# Patient Record
Sex: Female | Born: 1935 | Race: White | Hispanic: No | Smoking: Never smoker
Health system: Southern US, Community
[De-identification: ages and names within clinical notes are randomized; demographics above are authoritative.]

## PROBLEM LIST (undated history)

## (undated) DIAGNOSIS — K219 Gastro-esophageal reflux disease without esophagitis: Secondary | ICD-10-CM

## (undated) DIAGNOSIS — E785 Hyperlipidemia, unspecified: Secondary | ICD-10-CM

## (undated) DIAGNOSIS — I10 Essential (primary) hypertension: Secondary | ICD-10-CM

## (undated) DIAGNOSIS — Z853 Personal history of malignant neoplasm of breast: Secondary | ICD-10-CM

## (undated) DIAGNOSIS — E079 Disorder of thyroid, unspecified: Secondary | ICD-10-CM

## (undated) DIAGNOSIS — I639 Cerebral infarction, unspecified: Secondary | ICD-10-CM

## (undated) DIAGNOSIS — K469 Unspecified abdominal hernia without obstruction or gangrene: Secondary | ICD-10-CM

## (undated) DIAGNOSIS — C50919 Malignant neoplasm of unspecified site of unspecified female breast: Secondary | ICD-10-CM

## (undated) HISTORY — DX: Unspecified abdominal hernia without obstruction or gangrene: K46.9

## (undated) HISTORY — DX: Personal history of malignant neoplasm of breast: Z85.3

## (undated) HISTORY — PX: BREAST BIOPSY: SHX20

## (undated) HISTORY — DX: Cerebral infarction, unspecified: I63.9

## (undated) HISTORY — DX: Disorder of thyroid, unspecified: E07.9

## (undated) HISTORY — DX: Gastro-esophageal reflux disease without esophagitis: K21.9

## (undated) HISTORY — DX: Hyperlipidemia, unspecified: E78.5

## (undated) HISTORY — DX: Essential (primary) hypertension: I10

---

## 1998-10-28 ENCOUNTER — Other Ambulatory Visit: Admission: RE | Admit: 1998-10-28 | Discharge: 1998-10-28 | Payer: Self-pay | Admitting: *Deleted

## 1999-11-08 ENCOUNTER — Encounter: Admission: RE | Admit: 1999-11-08 | Discharge: 1999-11-08 | Payer: Self-pay | Admitting: Internal Medicine

## 1999-11-08 ENCOUNTER — Encounter: Payer: Self-pay | Admitting: Internal Medicine

## 2000-05-09 ENCOUNTER — Encounter: Admission: RE | Admit: 2000-05-09 | Discharge: 2000-05-09 | Payer: Self-pay | Admitting: Internal Medicine

## 2000-05-09 ENCOUNTER — Encounter: Payer: Self-pay | Admitting: Internal Medicine

## 2000-05-16 ENCOUNTER — Encounter: Payer: Self-pay | Admitting: Internal Medicine

## 2000-05-16 ENCOUNTER — Encounter: Admission: RE | Admit: 2000-05-16 | Discharge: 2000-05-16 | Payer: Self-pay | Admitting: Internal Medicine

## 2000-12-20 ENCOUNTER — Encounter: Payer: Self-pay | Admitting: Internal Medicine

## 2000-12-20 ENCOUNTER — Encounter: Admission: RE | Admit: 2000-12-20 | Discharge: 2000-12-20 | Payer: Self-pay | Admitting: Internal Medicine

## 2001-01-02 ENCOUNTER — Encounter: Admission: RE | Admit: 2001-01-02 | Discharge: 2001-01-02 | Payer: Self-pay | Admitting: Internal Medicine

## 2001-01-02 ENCOUNTER — Encounter: Payer: Self-pay | Admitting: Internal Medicine

## 2001-04-09 ENCOUNTER — Other Ambulatory Visit: Admission: RE | Admit: 2001-04-09 | Discharge: 2001-04-09 | Payer: Self-pay | Admitting: Obstetrics and Gynecology

## 2001-12-16 ENCOUNTER — Ambulatory Visit (HOSPITAL_COMMUNITY): Admission: RE | Admit: 2001-12-16 | Discharge: 2001-12-16 | Payer: Self-pay | Admitting: *Deleted

## 2002-01-30 ENCOUNTER — Encounter: Admission: RE | Admit: 2002-01-30 | Discharge: 2002-01-30 | Payer: Self-pay | Admitting: Obstetrics and Gynecology

## 2002-01-30 ENCOUNTER — Encounter: Payer: Self-pay | Admitting: Obstetrics and Gynecology

## 2003-03-25 ENCOUNTER — Encounter: Admission: RE | Admit: 2003-03-25 | Discharge: 2003-03-25 | Payer: Self-pay | Admitting: Internal Medicine

## 2003-04-28 ENCOUNTER — Other Ambulatory Visit: Admission: RE | Admit: 2003-04-28 | Discharge: 2003-04-28 | Payer: Self-pay | Admitting: Obstetrics and Gynecology

## 2004-05-10 ENCOUNTER — Encounter: Admission: RE | Admit: 2004-05-10 | Discharge: 2004-05-10 | Payer: Self-pay | Admitting: Internal Medicine

## 2004-05-26 ENCOUNTER — Other Ambulatory Visit: Admission: RE | Admit: 2004-05-26 | Discharge: 2004-05-26 | Payer: Self-pay | Admitting: Obstetrics and Gynecology

## 2005-06-19 ENCOUNTER — Encounter: Admission: RE | Admit: 2005-06-19 | Discharge: 2005-06-19 | Payer: Self-pay | Admitting: Internal Medicine

## 2005-07-04 ENCOUNTER — Other Ambulatory Visit: Admission: RE | Admit: 2005-07-04 | Discharge: 2005-07-04 | Payer: Self-pay | Admitting: Obstetrics and Gynecology

## 2006-06-28 ENCOUNTER — Encounter: Admission: RE | Admit: 2006-06-28 | Discharge: 2006-06-28 | Payer: Self-pay | Admitting: Internal Medicine

## 2007-07-01 ENCOUNTER — Encounter: Admission: RE | Admit: 2007-07-01 | Discharge: 2007-07-01 | Payer: Self-pay | Admitting: Internal Medicine

## 2007-07-10 ENCOUNTER — Encounter: Admission: RE | Admit: 2007-07-10 | Discharge: 2007-07-10 | Payer: Self-pay | Admitting: Internal Medicine

## 2007-07-14 ENCOUNTER — Encounter: Admission: RE | Admit: 2007-07-14 | Discharge: 2007-07-14 | Payer: Self-pay | Admitting: Internal Medicine

## 2008-12-22 ENCOUNTER — Encounter: Admission: RE | Admit: 2008-12-22 | Discharge: 2008-12-22 | Payer: Self-pay | Admitting: Internal Medicine

## 2010-03-23 ENCOUNTER — Encounter: Admission: RE | Admit: 2010-03-23 | Discharge: 2010-03-23 | Payer: Self-pay | Admitting: Internal Medicine

## 2010-04-04 ENCOUNTER — Encounter
Admission: RE | Admit: 2010-04-04 | Discharge: 2010-04-04 | Payer: Self-pay | Source: Home / Self Care | Attending: Internal Medicine | Admitting: Internal Medicine

## 2010-04-19 ENCOUNTER — Encounter
Admission: RE | Admit: 2010-04-19 | Discharge: 2010-04-19 | Payer: Self-pay | Source: Home / Self Care | Attending: Internal Medicine | Admitting: Internal Medicine

## 2010-04-23 HISTORY — PX: BREAST LUMPECTOMY: SHX2

## 2010-05-09 ENCOUNTER — Encounter
Admission: RE | Admit: 2010-05-09 | Discharge: 2010-05-09 | Payer: Self-pay | Source: Home / Self Care | Attending: Surgery | Admitting: Surgery

## 2010-05-10 LAB — COMPREHENSIVE METABOLIC PANEL
ALT: 31 U/L (ref 0–35)
AST: 39 U/L — ABNORMAL HIGH (ref 0–37)
Albumin: 3.5 g/dL (ref 3.5–5.2)
Alkaline Phosphatase: 48 U/L (ref 39–117)
BUN: 14 mg/dL (ref 6–23)
CO2: 28 mEq/L (ref 19–32)
Calcium: 9.2 mg/dL (ref 8.4–10.5)
Chloride: 106 mEq/L (ref 96–112)
Creatinine, Ser: 0.78 mg/dL (ref 0.4–1.2)
GFR calc Af Amer: >60 mL/min (ref >60)
GFR calc non Af Amer: >60 mL/min (ref >60)
Glucose, Bld: 90 mg/dL (ref 70–99)
Potassium: 4.4 mEq/L (ref 3.5–5.1)
Sodium: 140 mEq/L (ref 135–145)
Total Bilirubin: 0.5 mg/dL (ref 0.3–1.2)
Total Protein: 6.6 g/dL (ref 6.0–8.3)

## 2010-05-10 LAB — CBC
HCT: 32.6 % — ABNORMAL LOW (ref 36.0–46.0)
Hemoglobin: 11.1 g/dL — ABNORMAL LOW (ref 12.0–15.0)
MCH: 33.1 pg (ref 26.0–34.0)
MCHC: 34 g/dL (ref 30.0–36.0)
MCV: 97.3 fL (ref 78.0–100.0)
Platelets: 294 10*3/uL (ref 150–400)
RBC: 3.35 MIL/uL — ABNORMAL LOW (ref 3.87–5.11)
RDW: 14.7 % (ref 11.5–15.5)
WBC: 6.4 10*3/uL (ref 4.0–10.5)

## 2010-05-10 LAB — URINALYSIS, ROUTINE W REFLEX MICROSCOPIC
Bilirubin Urine: NEGATIVE
Hgb urine dipstick: NEGATIVE
Ketones, ur: NEGATIVE mg/dL
Nitrite: NEGATIVE
Protein, ur: NEGATIVE mg/dL
Specific Gravity, Urine: 1.007 (ref 1.005–1.030)
Urine Glucose, Fasting: NEGATIVE mg/dL
Urobilinogen, UA: 0.2 mg/dL (ref 0.0–1.0)
pH: 7.5 (ref 5.0–8.0)

## 2010-05-10 LAB — DIFFERENTIAL
Basophils Absolute: 0.1 10*3/uL (ref 0.0–0.1)
Basophils Relative: 1 % (ref 0–1)
Eosinophils Absolute: 0.3 10*3/uL (ref 0.0–0.7)
Eosinophils Relative: 5 % (ref 0–5)
Lymphocytes Relative: 34 % (ref 12–46)
Lymphs Abs: 2.2 10*3/uL (ref 0.7–4.0)
Monocytes Absolute: 0.9 10*3/uL (ref 0.1–1.0)
Monocytes Relative: 15 % — ABNORMAL HIGH (ref 3–12)
Neutro Abs: 2.9 10*3/uL (ref 1.7–7.7)
Neutrophils Relative %: 46 % (ref 43–77)

## 2010-05-11 ENCOUNTER — Ambulatory Visit
Admission: RE | Admit: 2010-05-11 | Discharge: 2010-05-11 | Payer: Self-pay | Source: Home / Self Care | Attending: Surgery | Admitting: Surgery

## 2010-05-11 ENCOUNTER — Encounter
Admission: RE | Admit: 2010-05-11 | Discharge: 2010-05-11 | Payer: Self-pay | Source: Home / Self Care | Attending: Surgery | Admitting: Surgery

## 2010-05-12 ENCOUNTER — Ambulatory Visit (HOSPITAL_BASED_OUTPATIENT_CLINIC_OR_DEPARTMENT_OTHER): Payer: Medicare Other | Admitting: Oncology

## 2010-05-14 ENCOUNTER — Encounter: Payer: Self-pay | Admitting: Internal Medicine

## 2010-05-14 NOTE — Op Note (Signed)
  [name], [name] NO.:  [id number]  MEDICAL RECORD NO.:  [id number]          PATIENT TYPE:  AMB  LOCATION:  DSC                          FACILITY:  [facility]  PHYSICIAN:  Currie Paris, M.D.DATE OF BIRTH:  March 19, 1920  DATE OF PROCEDURE:  05/11/2010 DATE OF DISCHARGE:                              OPERATIVE REPORT   PREOPERATIVE DIAGNOSIS:  Carcinoma, left breast upper outer quadrant, clinical stage I.  POSTOPERATIVE DIAGNOSIS:  Carcinoma, left breast upper outer quadrant, clinical stage I.  PROCEDURE:  Needle-guided left lumpectomy.  SURGEON:  Currie Paris, M.D.  ANESTHESIA:  General.  CLINICAL HISTORY:  This is an 89-year lady who has had a mammogram showing lesion in the upper outer quadrant of the left breast; it was a receptor positive, HER-2 negative, low S-phase invasive carcinoma with some ductal features.  After presentation at the cancer conference and discussion with the patient, we elected to proceed to a needle-guided excision.  A preoperative chest x-ray showed a questionable  lesion in the apex of the lung and plans were made to evaluate that following surgery.  DESCRIPTION OF PROCEDURE:  I saw the patient in the holding area, she had no further questions.  We confirmed the surgery as noted above and I marked the left breast.  I reviewed the guidewire films and it appeared that the guidewire had backed out somewhat, just barely being in the actual lesion.  The patient was taken to the operating room after satisfactory general anesthesia had been obtained.  The breast was prepped and draped and the time-out done.  The guidewire entered medially and tracked laterally, almost directly horizontally, so I made an incision starting at the guidewire and going directly over its tract.  The breast tissue was divided to the chest wall medial to the guidewire since the tumor was lateral to that point, and then, raised superior and  inferior flaps and then divided the breast tissue down to the chest wall and took the fascia.  The tissue here was only about 1.5 cm thick, perhaps 2 cm, and I took full thickness.  The skin flaps were extremely thin.  I got beyond the tip of the guidewire which had already backed itself somewhat out, so I went fairly far lateral to make sure I had a good lateral margin and completed the excision.  There was a firm area that appeared to be pretty much in the middle of the specimen, and I went ahead and inked it for margin assessment.  I put some 0.25% plain Marcaine, needed to help with postop analgesia, and clips to mark the margins and then closed with 3-0 Vicryl and 4-0 Monocryl subcuticular and Dermabond.  The patient tolerated the procedure well and there were no complications.  All counts were correct.     Currie Paris, M.D.     CJS/MEDQ  D:  05/11/2010  T:  05/12/2010  Job:  [id number]  cc:   Dr. Legrand Rams  Electronically Signed by Cyndia Bent M.D. on 05/14/2010 07:38:55 AM

## 2010-05-24 ENCOUNTER — Other Ambulatory Visit: Payer: Self-pay | Admitting: Oncology

## 2010-05-24 ENCOUNTER — Ambulatory Visit (HOSPITAL_BASED_OUTPATIENT_CLINIC_OR_DEPARTMENT_OTHER): Payer: Medicare Other | Admitting: Oncology

## 2010-05-24 DIAGNOSIS — C50919 Malignant neoplasm of unspecified site of unspecified female breast: Secondary | ICD-10-CM

## 2010-05-24 LAB — CANCER ANTIGEN 27.29: CA 27.29: 21 U/mL (ref 0–39)

## 2010-05-26 ENCOUNTER — Ambulatory Visit: Payer: Medicare Other | Attending: Radiation Oncology | Admitting: Radiation Oncology

## 2010-05-26 DIAGNOSIS — I10 Essential (primary) hypertension: Secondary | ICD-10-CM | POA: Insufficient documentation

## 2010-05-26 DIAGNOSIS — M899 Disorder of bone, unspecified: Secondary | ICD-10-CM | POA: Insufficient documentation

## 2010-05-26 DIAGNOSIS — K219 Gastro-esophageal reflux disease without esophagitis: Secondary | ICD-10-CM | POA: Insufficient documentation

## 2010-05-26 DIAGNOSIS — C50419 Malignant neoplasm of upper-outer quadrant of unspecified female breast: Secondary | ICD-10-CM | POA: Insufficient documentation

## 2010-05-26 DIAGNOSIS — M949 Disorder of cartilage, unspecified: Secondary | ICD-10-CM | POA: Insufficient documentation

## 2010-05-26 DIAGNOSIS — Z79899 Other long term (current) drug therapy: Secondary | ICD-10-CM | POA: Insufficient documentation

## 2010-05-26 DIAGNOSIS — E039 Hypothyroidism, unspecified: Secondary | ICD-10-CM | POA: Insufficient documentation

## 2010-05-26 DIAGNOSIS — E7800 Pure hypercholesterolemia, unspecified: Secondary | ICD-10-CM | POA: Insufficient documentation

## 2010-05-26 DIAGNOSIS — Z170 Estrogen receptor positive status [ER+]: Secondary | ICD-10-CM | POA: Insufficient documentation

## 2010-05-29 ENCOUNTER — Other Ambulatory Visit: Payer: Self-pay | Admitting: Dermatology

## 2010-05-30 ENCOUNTER — Ambulatory Visit (HOSPITAL_BASED_OUTPATIENT_CLINIC_OR_DEPARTMENT_OTHER)
Admission: RE | Admit: 2010-05-30 | Discharge: 2010-05-31 | Disposition: A | Discharge status: Home / Self Care | Payer: Medicare Other | Source: Ambulatory Visit | Attending: Surgery | Admitting: Surgery

## 2010-05-30 ENCOUNTER — Other Ambulatory Visit: Payer: Self-pay | Admitting: Surgery

## 2010-05-30 DIAGNOSIS — Z01812 Encounter for preprocedural laboratory examination: Secondary | ICD-10-CM | POA: Insufficient documentation

## 2010-05-30 DIAGNOSIS — C50419 Malignant neoplasm of upper-outer quadrant of unspecified female breast: Secondary | ICD-10-CM | POA: Insufficient documentation

## 2010-05-30 LAB — POCT I-STAT, CHEM 8
BUN: 18 mg/dL (ref 6–23)
Calcium, Ion: 1.21 mmol/L (ref 1.12–1.32)
HCT: 38 % (ref 36.0–46.0)
Sodium: 144 mEq/L (ref 135–145)
TCO2: 25 mmol/L (ref 0–100)

## 2010-06-04 NOTE — Op Note (Signed)
[name], Tabitha               ACCOUNT NO.:  [id number]  MEDICAL RECORD NO.:  [id number]           PATIENT TYPE:  LOCATION:                                 FACILITY:  PHYSICIAN:  Currie Paris, M.D.DATE OF BIRTH:  1919/11/03  DATE OF PROCEDURE:  05/30/2010 DATE OF DISCHARGE:                              OPERATIVE REPORT   PREOPERATIVE DIAGNOSIS:  Lobular carcinoma, left breast, upper outer quadrant, clinical stage I with positive margin on initial lumpectomy.  POSTOPERATIVE DIAGNOSIS:  Lobular carcinoma, left breast, upper outer quadrant, clinical stage I with positive margin on initial lumpectomy.  PROCEDURE:  Re-excision for margin control, left lumpectomy site.  SURGEON:  Currie Paris, MD  ANESTHESIA:  General.  CLINICAL HISTORY:  This is an 77 year old lady who had a small mass found on mammogram and biopsy showed an invasive lobular carcinoma. Wide local excision with the needle technique was done and grossly we appeared to be well around the tumor but she had an anterior margin and a superior margin that were close to being involved or involved, and it was decided to re-excise to see if we can get better margin control.  DESCRIPTION OF THE PROCEDURE:  I saw the patient in the holding area, she had no further questions.  Identified the left side as the operative side and both the patient and I marked that as the operative site.  The patient was taken to the operating room.  After satisfactory general (LMA) anesthesia had been obtained, the left breast was prepped and draped.  The time-out was done.  I made an elliptical incision to include about 1-cm skin superior to the scar and about 0.5 cm of skin inferior to the scar and extending the incision about 1 cm medially and laterally.  I then divided the breast tissue along the inferior part of the incision down into the lumpectomy cavity so that I would have the old superior margin as my new  margin here.  I then raised a skin flap superiorly approximately a centimeter so that I was thus 2 cm above the old scar, then divided the breast tissue down to the chest wall and then re-excised the entire superior portion of the prior lumpectomy cavity and site getting what I thought was at least a 2-cm new margin.  All of the tissue at my new margin looked to be fatty tissue, and we were fairly high almost out of the breast by this point.  I made sure everything was dry.  I irrigated.  I put clips into deep margins and the superior medial and lateral margins as well.  I did not take any new deep margin.  I then freed up the breast tissue off the fascia and tried to close the deeper layers with some 3-0 Vicryl, then more superficially with 3-0 Vicryl, 4-0 Monocryl subcuticular, and Dermabond.  The patient tolerated the procedure well.  There were no operative complications.  All counts were correct.  Specimens sent to pathology with some sutures to identify and orient the specimen for pathology.     Currie Paris, M.D.  CJS/MEDQ  D:  05/30/2010  T:  05/30/2010  Job:  [id number]  cc:   Georgianne Fick, M.D.  Electronically Signed by Cyndia Bent M.D. on 06/03/2010 02:40:49 PM

## 2010-07-04 ENCOUNTER — Ambulatory Visit (HOSPITAL_COMMUNITY)
Admission: RE | Admit: 2010-07-04 | Discharge: 2010-07-04 | Disposition: A | Discharge status: Home / Self Care | Payer: Medicare Other | Source: Ambulatory Visit | Attending: Oncology | Admitting: Oncology

## 2010-07-04 DIAGNOSIS — R918 Other nonspecific abnormal finding of lung field: Secondary | ICD-10-CM | POA: Insufficient documentation

## 2010-07-04 DIAGNOSIS — R599 Enlarged lymph nodes, unspecified: Secondary | ICD-10-CM | POA: Insufficient documentation

## 2010-07-04 DIAGNOSIS — N281 Cyst of kidney, acquired: Secondary | ICD-10-CM | POA: Insufficient documentation

## 2010-07-04 DIAGNOSIS — C50919 Malignant neoplasm of unspecified site of unspecified female breast: Secondary | ICD-10-CM | POA: Insufficient documentation

## 2010-07-04 MED ORDER — IOHEXOL 300 MG/ML  SOLN
80.0000 mL | Freq: Once | INTRAMUSCULAR | Status: AC | PRN
  Administered 2010-07-04: 80 mL via INTRAVENOUS

## 2010-08-07 ENCOUNTER — Other Ambulatory Visit: Payer: Self-pay | Admitting: Oncology

## 2010-08-07 ENCOUNTER — Encounter (HOSPITAL_BASED_OUTPATIENT_CLINIC_OR_DEPARTMENT_OTHER): Payer: Medicare Other | Admitting: Oncology

## 2010-08-07 DIAGNOSIS — C50919 Malignant neoplasm of unspecified site of unspecified female breast: Secondary | ICD-10-CM

## 2010-08-07 DIAGNOSIS — C50419 Malignant neoplasm of upper-outer quadrant of unspecified female breast: Secondary | ICD-10-CM

## 2010-08-07 LAB — HEPATIC FUNCTION PANEL
Albumin: 4 g/dL (ref 3.5–5.2)
Bilirubin, Direct: 0.2 mg/dL (ref 0.0–0.3)
Total Bilirubin: 0.6 mg/dL (ref 0.3–1.2)

## 2010-09-08 NOTE — Op Note (Signed)
   NAME:  Tabitha Harris, Tabitha Harris NO.:  [id number]   MEDICAL RECORD NO.:  [id number]                   PATIENT TYPE:  AMB   LOCATION:  ENDO                                 FACILITY:  [facility]   PHYSICIAN:  Georgiana Spinner, M.D.                 DATE OF BIRTH:  [birth year]   DATE OF PROCEDURE:  12/16/2001  DATE OF DISCHARGE:                                 OPERATIVE REPORT   PROCEDURE PERFORMED:  Colonoscopy.   INDICATIONS FOR PROCEDURE:  Colon cancer screening.   ANESTHESIA:  Demerol 10, Versed 1 mg.   DESCRIPTION OF PROCEDURE:  With the patient mildly sedated in the left  lateral decubitus position, the Olympus videoscopic colonoscope was inserted  into the rectum and passed under direct vision into the cecum, identified by  the ileocecal valve and appendiceal orifice.  Both of which were  photographed.  From this point, the colonoscope was slowly withdrawn taking  circumferential views of the entire colonic mucosa, stopping in the rectum  which appeared normal on direct and showed hemorrhoids on retroflex view.  The endoscope was straightened and withdrawn.  The patient's vital signs and  pulse oximeter remained stable.  The patient tolerated the procedure well  without apparent complications.   FINDINGS:  Unremarkable colonoscopic examination other than hemorrhoids.   PLAN:  Repeat examination in five to ten years.                                                Georgiana Spinner, M.D.    GMO/MEDQ  D:  12/16/2001  T:  12/18/2001  Job:  [id number]

## 2010-12-18 ENCOUNTER — Other Ambulatory Visit: Payer: Self-pay | Admitting: Internal Medicine

## 2010-12-18 DIAGNOSIS — I6529 Occlusion and stenosis of unspecified carotid artery: Secondary | ICD-10-CM

## 2010-12-21 ENCOUNTER — Ambulatory Visit
Admission: RE | Admit: 2010-12-21 | Discharge: 2010-12-21 | Disposition: A | Discharge status: Home / Self Care | Payer: Medicare Other | Source: Ambulatory Visit | Attending: Internal Medicine | Admitting: Internal Medicine

## 2010-12-21 DIAGNOSIS — I6529 Occlusion and stenosis of unspecified carotid artery: Secondary | ICD-10-CM

## 2010-12-29 ENCOUNTER — Other Ambulatory Visit (HOSPITAL_COMMUNITY): Payer: Self-pay | Admitting: Internal Medicine

## 2010-12-29 DIAGNOSIS — I6529 Occlusion and stenosis of unspecified carotid artery: Secondary | ICD-10-CM

## 2011-01-01 ENCOUNTER — Other Ambulatory Visit: Payer: Self-pay | Admitting: Dermatology

## 2011-01-08 ENCOUNTER — Ambulatory Visit (HOSPITAL_COMMUNITY)
Admission: RE | Admit: 2011-01-08 | Discharge: 2011-01-08 | Disposition: A | Discharge status: Home / Self Care | Payer: Medicare Other | Source: Ambulatory Visit | Attending: Internal Medicine | Admitting: Internal Medicine

## 2011-01-08 DIAGNOSIS — I6529 Occlusion and stenosis of unspecified carotid artery: Secondary | ICD-10-CM

## 2011-03-07 ENCOUNTER — Ambulatory Visit (HOSPITAL_BASED_OUTPATIENT_CLINIC_OR_DEPARTMENT_OTHER): Payer: Medicare Other | Admitting: Oncology

## 2011-03-07 ENCOUNTER — Telehealth: Payer: Self-pay | Admitting: *Deleted

## 2011-03-07 ENCOUNTER — Other Ambulatory Visit: Payer: Self-pay | Admitting: Oncology

## 2011-03-07 ENCOUNTER — Other Ambulatory Visit (HOSPITAL_BASED_OUTPATIENT_CLINIC_OR_DEPARTMENT_OTHER): Payer: Medicare Other

## 2011-03-07 VITALS — BP 152/77 | HR 67 | Temp 98.1°F | Ht 60.5 in | Wt 112.4 lb

## 2011-03-07 DIAGNOSIS — Z170 Estrogen receptor positive status [ER+]: Secondary | ICD-10-CM

## 2011-03-07 DIAGNOSIS — C50419 Malignant neoplasm of upper-outer quadrant of unspecified female breast: Secondary | ICD-10-CM

## 2011-03-07 DIAGNOSIS — C50919 Malignant neoplasm of unspecified site of unspecified female breast: Secondary | ICD-10-CM

## 2011-03-07 DIAGNOSIS — M549 Dorsalgia, unspecified: Secondary | ICD-10-CM

## 2011-03-07 LAB — CBC WITH DIFFERENTIAL/PLATELET
Basophils Absolute: 0.1 10*3/uL (ref 0.0–0.1)
EOS%: 4 % (ref 0.0–7.0)
HCT: 33.5 % — ABNORMAL LOW (ref 34.8–46.6)
HGB: 11.5 g/dL — ABNORMAL LOW (ref 11.6–15.9)
LYMPH%: 36.1 % (ref 14.0–49.7)
MCH: 34 pg (ref 25.1–34.0)
NEUT%: 45.8 % (ref 38.4–76.8)
Platelets: 263 10*3/uL (ref 145–400)
lymph#: 2.4 10*3/uL (ref 0.9–3.3)

## 2011-03-07 NOTE — Telephone Encounter (Signed)
Gave patient appointment for 02-2012 lab on one day and the md appointment the week after

## 2011-03-07 NOTE — Progress Notes (Signed)
Tabitha Harris is an 77 y.o. female.    Chief Complaint  Patient presents with  . Breast Cancer    HPI:  Tabitha Harris returns for followup of her breast cancer the interval history is generally unremarkable except for the bad news that her son in [place] has been diagnosed with pancreatic cancer metastatic to the liver. "The whole family is very upset about this". As far as cost of the drugs is concerned, she was being built $200 a month by [employer] for letrozole, and she wrote him a letter which they never answered. I have asked her to bring the those bills and we will see if we can send [employer] a letter because that was really high rubbery. She is now getting her medication from [facility] at less than the fourth of the cost  Past Medical History: Significant for osteopenia hypertension hypothyroidism hypercholesterolemia glaucoma GERD long-standing heart murmur a lateral cataract surgery and right carotid artery stenosis   Family History: The patient's father died from a subarachnoid hemorrhage. The patient's mother died at the age of 63. The cause was unclear. The patient has 3 sisters one of whom had cancer of the uterus. There is no history of breast or ovarian cancer in the family she had no brothers  Gynecologic history:  GX T4 first pregnancy to term age 83 menarche age 72 menopause early 37s she never took hormone   Social History: Her husband died from esophageal cancer approximately 17 years ago. She lives by herself. Her oldest daughter [name], 66, lives in [place] and works in a Scientist, physiological a lab. Son [name] lives in [place] and works as an Acupuncturist. He is 61. Son [name] 63 is an Pensions consultant for [employer]. The patient's fourth child a daughter died from uterine cancer at the age of 34 and 77 the patient has 7 grandchildren she is not a church a tender      Allergies: Allergies not on file  No current outpatient prescriptions on file as of 03/07/2011.   No  current facility-administered medications on file as of 03/07/2011.    ROS she plays tennis regularly. When she takes long walks or hips hurt. He is a little bit of a runny nose which is the common for her this time of year. She bruises easily has had some skin cancers removed within the last year and she thinks she needs new glasses overall however a detailed review of systems was noncontributory except for mild low back pain which is chronic, and has not increased in intensity or frequency  Physical Exam:  Blood pressure 152/77, pulse 67, temperature 98.1 F (36.7 C), height 5' 0.5" (1.537 m), weight 112 lb 6.4 oz (50.984 kg).  Sclerae unicteric Oropharynx clear No peripheral adenopathy Lungs no rales or rhonchi Heart regular rate and rhythm Abd benign MSK no focal spinal tenderness, no peripheral edema, mod scoliosis Neuro: nonfocal Breasts: Right breast no suspicious findings left breast status post lumpectomy no evidence of local recurrence  CBC Lab Results  Component Value Date   WBC 6.4 05/09/2010   HGB 11.5* 03/07/2011   HCT 33.5* 03/07/2011   MCV 99.1 03/07/2011   PLT 263 03/07/2011   CMP     Component Value Date/Time   NA 144 05/30/2010 0726   K 3.9 05/30/2010 0726   CL 109 05/30/2010 0726   CO2 28 05/09/2010 1202   GLUCOSE 88 05/30/2010 0726   BUN 18 05/30/2010 0726   CREATININE 0.9 05/30/2010 0726  CALCIUM 9.2 05/09/2010 1202   PROT 6.5 08/07/2010 1408   ALBUMIN 4.0 08/07/2010 1408   AST 30 08/07/2010 1408   ALT 23 08/07/2010 1408   ALKPHOS 51 08/07/2010 1408   BILITOT 0.6 08/07/2010 1408   GFRNONAA >60 05/09/2010 1202   GFRAA  Value: >60        The eGFR has been calculated using the MDRD equation. This calculation has not been validated in all clinical situations. eGFR's persistently <60 mL/min signify possible Chronic Kidney Disease. 05/09/2010 1202     FILMS: She is due for mammography next month and this is being set up to tells me she had bone density which showed mild  osteopenia I do not have those results.   Assessment:  77 year old [place] woman status post left lumpectomy January of 2012 for a 1.5 cm grade 1 invasive lobular breast cancer, with positive margins cleared early February 2012, the tumor being strongly estrogen and progesterone receptor positive, MIB-1 of 14% and no HER-2 amplification, on letrozole since Feb of 2012  Plan:  She was losing some hair and stop taking the letrozole for a couple of months. She has not resumed. It is not clear to me whether the hair loss was really due to this or not but if it recurs we will switch her to exemestane. She will bring [place] at the bills from the [employer] over charge and we will see if writing than the latter gets her a refund. Otherwise the plan is to continue letrozole or another antiestrogen for a total of 5 years she will continue to see [place] on a once a year basis alternating with Dr. Serita Kyle C 03/07/2011, 1:39 PM

## 2011-04-06 ENCOUNTER — Ambulatory Visit
Admission: RE | Admit: 2011-04-06 | Discharge: 2011-04-06 | Disposition: A | Discharge status: Home / Self Care | Payer: Medicare Other | Source: Ambulatory Visit | Attending: Oncology | Admitting: Oncology

## 2011-04-06 DIAGNOSIS — C50919 Malignant neoplasm of unspecified site of unspecified female breast: Secondary | ICD-10-CM

## 2011-05-25 ENCOUNTER — Encounter (INDEPENDENT_AMBULATORY_CARE_PROVIDER_SITE_OTHER): Payer: Self-pay | Admitting: General Surgery

## 2011-05-25 DIAGNOSIS — Z853 Personal history of malignant neoplasm of breast: Secondary | ICD-10-CM | POA: Insufficient documentation

## 2011-05-30 ENCOUNTER — Ambulatory Visit (INDEPENDENT_AMBULATORY_CARE_PROVIDER_SITE_OTHER): Payer: Medicare Other | Admitting: Surgery

## 2011-05-30 ENCOUNTER — Encounter (INDEPENDENT_AMBULATORY_CARE_PROVIDER_SITE_OTHER): Payer: Self-pay | Admitting: Surgery

## 2011-05-30 VITALS — BP 118/86 | HR 64 | Temp 97.6°F | Resp 18 | Ht 59.0 in | Wt 112.0 lb

## 2011-05-30 DIAGNOSIS — Z853 Personal history of malignant neoplasm of breast: Secondary | ICD-10-CM

## 2011-05-30 NOTE — Progress Notes (Signed)
NAME: Tabitha Harris       DOB: 1920/03/25           DATE: 05/30/2011       MRN: [id number]   Tabitha Harris is a 78 y.o.Marland Kitchenfemale who presents for routine followup of her Left breast cancer diagnosed in 2012 and treated with lumpectomy alone. She has no problems or concerns on either side.  PFSH: She has had no significant changes since the last visit here.  ROS: There have been no significant changes since the last visit here  EXAM: General: The patient is alert, oriented, generally healty appearing, NAD. Mood and affect are normal.  Breasts:  Symmetric, mild irregularity and nodularity bilaterally, no mass, no tenderness  Lymphatics: She has no axillary or supraclavicular adenopathy on either side.  Extremities: Full ROM of the surgical side with no lymphedema noted.  Data Reviewed: Mammogram in December negative  Impression: Doing well, with no evidence of recurrent cancer or new cancer  Plan: Will continue to follow up in six months.

## 2011-06-13 DIAGNOSIS — E785 Hyperlipidemia, unspecified: Secondary | ICD-10-CM | POA: Diagnosis not present

## 2011-06-13 DIAGNOSIS — R5381 Other malaise: Secondary | ICD-10-CM | POA: Diagnosis not present

## 2011-06-20 DIAGNOSIS — E782 Mixed hyperlipidemia: Secondary | ICD-10-CM | POA: Diagnosis not present

## 2011-06-20 DIAGNOSIS — E039 Hypothyroidism, unspecified: Secondary | ICD-10-CM | POA: Diagnosis not present

## 2011-06-20 DIAGNOSIS — I10 Essential (primary) hypertension: Secondary | ICD-10-CM | POA: Diagnosis not present

## 2011-06-20 DIAGNOSIS — I6529 Occlusion and stenosis of unspecified carotid artery: Secondary | ICD-10-CM | POA: Diagnosis not present

## 2011-07-02 ENCOUNTER — Other Ambulatory Visit: Payer: Self-pay | Admitting: Dermatology

## 2011-07-02 DIAGNOSIS — L570 Actinic keratosis: Secondary | ICD-10-CM | POA: Diagnosis not present

## 2011-07-02 DIAGNOSIS — C44519 Basal cell carcinoma of skin of other part of trunk: Secondary | ICD-10-CM | POA: Diagnosis not present

## 2011-07-02 DIAGNOSIS — L821 Other seborrheic keratosis: Secondary | ICD-10-CM | POA: Diagnosis not present

## 2011-07-02 DIAGNOSIS — Z85828 Personal history of other malignant neoplasm of skin: Secondary | ICD-10-CM | POA: Diagnosis not present

## 2011-07-26 DIAGNOSIS — H26499 Other secondary cataract, unspecified eye: Secondary | ICD-10-CM | POA: Diagnosis not present

## 2011-07-26 DIAGNOSIS — H4010X0 Unspecified open-angle glaucoma, stage unspecified: Secondary | ICD-10-CM | POA: Diagnosis not present

## 2011-07-26 DIAGNOSIS — H43399 Other vitreous opacities, unspecified eye: Secondary | ICD-10-CM | POA: Diagnosis not present

## 2011-12-25 ENCOUNTER — Other Ambulatory Visit: Payer: Self-pay | Admitting: Obstetrics and Gynecology

## 2011-12-25 DIAGNOSIS — Z124 Encounter for screening for malignant neoplasm of cervix: Secondary | ICD-10-CM | POA: Diagnosis not present

## 2011-12-26 DIAGNOSIS — E039 Hypothyroidism, unspecified: Secondary | ICD-10-CM | POA: Diagnosis not present

## 2011-12-26 DIAGNOSIS — Z79899 Other long term (current) drug therapy: Secondary | ICD-10-CM | POA: Diagnosis not present

## 2011-12-26 DIAGNOSIS — I6529 Occlusion and stenosis of unspecified carotid artery: Secondary | ICD-10-CM | POA: Diagnosis not present

## 2011-12-26 DIAGNOSIS — E782 Mixed hyperlipidemia: Secondary | ICD-10-CM | POA: Diagnosis not present

## 2011-12-26 DIAGNOSIS — I10 Essential (primary) hypertension: Secondary | ICD-10-CM | POA: Diagnosis not present

## 2012-01-02 DIAGNOSIS — E039 Hypothyroidism, unspecified: Secondary | ICD-10-CM | POA: Diagnosis not present

## 2012-01-02 DIAGNOSIS — H908 Mixed conductive and sensorineural hearing loss, unspecified: Secondary | ICD-10-CM | POA: Diagnosis not present

## 2012-01-02 DIAGNOSIS — J449 Chronic obstructive pulmonary disease, unspecified: Secondary | ICD-10-CM | POA: Diagnosis not present

## 2012-01-02 DIAGNOSIS — I10 Essential (primary) hypertension: Secondary | ICD-10-CM | POA: Diagnosis not present

## 2012-01-02 DIAGNOSIS — E782 Mixed hyperlipidemia: Secondary | ICD-10-CM | POA: Diagnosis not present

## 2012-01-07 DIAGNOSIS — L570 Actinic keratosis: Secondary | ICD-10-CM | POA: Diagnosis not present

## 2012-01-07 DIAGNOSIS — Z85828 Personal history of other malignant neoplasm of skin: Secondary | ICD-10-CM | POA: Diagnosis not present

## 2012-01-07 DIAGNOSIS — D239 Other benign neoplasm of skin, unspecified: Secondary | ICD-10-CM | POA: Diagnosis not present

## 2012-01-07 DIAGNOSIS — L821 Other seborrheic keratosis: Secondary | ICD-10-CM | POA: Diagnosis not present

## 2012-01-07 DIAGNOSIS — L608 Other nail disorders: Secondary | ICD-10-CM | POA: Diagnosis not present

## 2012-01-07 DIAGNOSIS — L820 Inflamed seborrheic keratosis: Secondary | ICD-10-CM | POA: Diagnosis not present

## 2012-01-14 ENCOUNTER — Ambulatory Visit (INDEPENDENT_AMBULATORY_CARE_PROVIDER_SITE_OTHER): Payer: Medicare Other | Admitting: Surgery

## 2012-01-14 ENCOUNTER — Encounter (INDEPENDENT_AMBULATORY_CARE_PROVIDER_SITE_OTHER): Payer: Self-pay | Admitting: Surgery

## 2012-01-14 VITALS — BP 112/68 | HR 56 | Temp 98.2°F | Resp 14 | Ht 59.5 in | Wt 110.4 lb

## 2012-01-14 DIAGNOSIS — Z853 Personal history of malignant neoplasm of breast: Secondary | ICD-10-CM | POA: Diagnosis not present

## 2012-01-14 NOTE — Progress Notes (Signed)
NAME: CHRYSTYNA PEARY Queener       DOB: 1919/11/20           DATE: 01/14/2012       MRN: [id number]   Tabitha Harris is a 78 y.o.Marland Kitchenfemale who presents for routine followup of her Left breast cancer (ILC) diagnosed in 2012 and treated with lumpectomy alone. She has no problems or concerns on either side.  PFSH: She has had no significant changes since the last visit here.  ROS: There have been no significant changes since the last visit here  EXAM: General: The patient is alert, oriented, generally healty appearing, NAD. Mood and affect are normal.  Breasts:  Symmetric, mild irregularity and nodularity bilaterally, no mass, no tenderness  Lymphatics: She has no axillary or supraclavicular adenopathy on either side.  Extremities: Full ROM of the surgical side with no lymphedema noted.  Data Reviewed: Mammogram in December negative  Impression: Doing well, with no evidence of recurrent cancer or new cancer  Plan: Will continue to follow up in six months.

## 2012-01-14 NOTE — Patient Instructions (Signed)
See me again in 6 months 

## 2012-02-01 DIAGNOSIS — Z23 Encounter for immunization: Secondary | ICD-10-CM | POA: Diagnosis not present

## 2012-02-04 DIAGNOSIS — H4010X0 Unspecified open-angle glaucoma, stage unspecified: Secondary | ICD-10-CM | POA: Diagnosis not present

## 2012-02-28 ENCOUNTER — Other Ambulatory Visit (HOSPITAL_BASED_OUTPATIENT_CLINIC_OR_DEPARTMENT_OTHER): Payer: Medicare Other | Admitting: Lab

## 2012-02-28 DIAGNOSIS — C50919 Malignant neoplasm of unspecified site of unspecified female breast: Secondary | ICD-10-CM

## 2012-02-28 LAB — CBC WITH DIFFERENTIAL/PLATELET
BASO%: 1.4 % (ref 0.0–2.0)
EOS%: 4.3 % (ref 0.0–7.0)
HCT: 32.8 % — ABNORMAL LOW (ref 34.8–46.6)
LYMPH%: 44.4 % (ref 14.0–49.7)
MCH: 36.5 pg — ABNORMAL HIGH (ref 25.1–34.0)
MCHC: 34.5 g/dL (ref 31.5–36.0)
MCV: 105.7 fL — ABNORMAL HIGH (ref 79.5–101.0)
MONO#: 0.7 10*3/uL (ref 0.1–0.9)
MONO%: 12.8 % (ref 0.0–14.0)
NEUT%: 37.1 % — ABNORMAL LOW (ref 38.4–76.8)
Platelets: 250 10*3/uL (ref 145–400)

## 2012-02-28 LAB — COMPREHENSIVE METABOLIC PANEL (CC13)
AST: 33 U/L (ref 5–34)
Alkaline Phosphatase: 58 U/L (ref 40–150)
BUN: 14 mg/dL (ref 7.0–26.0)
Calcium: 9.9 mg/dL (ref 8.4–10.4)
Creatinine: 0.8 mg/dL (ref 0.6–1.1)

## 2012-03-06 ENCOUNTER — Ambulatory Visit (HOSPITAL_BASED_OUTPATIENT_CLINIC_OR_DEPARTMENT_OTHER): Payer: Medicare Other | Admitting: Oncology

## 2012-03-06 ENCOUNTER — Telehealth: Payer: Self-pay | Admitting: Oncology

## 2012-03-06 VITALS — BP 133/78 | HR 81 | Temp 98.3°F | Resp 20 | Ht 59.5 in | Wt 110.6 lb

## 2012-03-06 DIAGNOSIS — Z853 Personal history of malignant neoplasm of breast: Secondary | ICD-10-CM

## 2012-03-06 DIAGNOSIS — C50419 Malignant neoplasm of upper-outer quadrant of unspecified female breast: Secondary | ICD-10-CM | POA: Diagnosis not present

## 2012-03-06 MED ORDER — LETROZOLE 2.5 MG PO TABS: 2.5000 mg | ORAL_TABLET | Freq: Every day | ORAL | Status: DC

## 2012-03-06 NOTE — Progress Notes (Signed)
ID: Tabitha Harris   DOB: [birth year]  MR#: [id number]  YQM#:[id number]  PCP: Georgianne Fick, MD GYN: Marcelle Overlie MD SUThersa Salt MD OTHER MD: Dorothy Puffer   HISTORY OF PRESENT ILLNESS: She had her routine screening mammography March 23, 2010 at the breast center and this showed heterogeneously dense tissue with a possible mass in the left breast.  She was recalled for additional views December 13.  This confirmed a small spiculated mass in the left upper outer quadrant posteriorly which was not palpable by exam.  Ultrasound showed it to be a small irregularly marginated mass measuring 5 mm.  The axilla was unremarkable.  This was felt to be sufficiently suspicious.  That biopsy was performed the same day and showed ([id number]) an invasive lobular carcinoma which was estrogen receptor positive at 99%, progesterone receptor positive at 55% with a low proliferation fraction at 14% and no evidence of HER2/neu amplification by CISH with a ratio of 0.94.  With this information, the patient was referred to Dr. Jamey Ripa and bilateral breast MRIs were obtained April 19, 2010.  This confirmed a solitary irregular enhancing mass in the upper outer quadrant of the left breast measuring again 5 mm.  There were no other suspicious areas and no adenopathy.  There was a trace right pleural effusion incidentally noted.  With this information after appropriate discussion, the patient proceeded to left lumpectomy May 11, 2010.  This case was discussed in the multidisciplinary breast cancer conference prior to surgery and it was felt axillary lymph node sampling would not alter treatment and therefore this was not performed.  The final pathology ([id number]) confirmed that invasive lobular carcinoma, grade 1, measuring 1.5 cm.  Margins anteriorly and superiorly were positive.   INTERVAL HISTORY: [name] returns for routine followup of her breast cancer. Interval history is unremarkable. She is doing  "fine", worked during the November 5 elections, continues to play tennis, and had family, from is far away as [place] to celebrate her 78th birthday.  REVIEW OF SYSTEMS: She is now paying $25 for 3 months of aromatase inhibitor, versus $200  Previously. Obviously she was over chart, and we worked hard to try to get her money back but that proved impossible. She is tolerating her letrozole with no side effects that she is aware of, and particularly in an of the arthralgias/myalgias that so many patients half. She complains of hearing loss, some low back pain which is not more frequent or intense than previously, and some easy bruising. Otherwise a detailed review of systems today was noncontributory   PAST MEDICAL HISTORY: Past Medical History  Diagnosis Date  . History of breast cancer     left  . Hypertension   . Hyperlipidemia   . GERD (gastroesophageal reflux disease)   . Thyroid disease     hypothyroidism  . Hernia   Past medical history is significant for osteopenia, hypertension, hypothyroidism, hypercholesterolemia, glaucoma, GERD, heart murmur ("I have had this all my life", "I have to take antibiotics before dental procedures"), status post bilateral cataract surgery.  PAST SURGICAL HISTORY: Past Surgical History  Procedure Date  . Breast lumpectomy 2012    left    FAMILY HISTORY Family History  Problem Relation Age of Onset  . Cancer Mother     breast  The patient's father died from a subarachnoid hemorrhage at the age of 17, the patient's mother died at the age of 19 from unclear causes.  The patient has three sisters, one of whom had  cancer of the uterus.  There is no history of breast or ovarian cancer in the family.  She had no brothers.  GYNECOLOGIC HISTORY: She is Gx, P4, first pregnancy to term age 57, menarche age 21, menopause in her early 28s.  She took hormones for less than a month because she "felt bad" while taking them.  SOCIAL HISTORY: Asked what she did  for living, she says "I play tennis as much as I can."  Her husband died from esophageal cancer 16 years ago and she lives by herself.  Her oldest daughter Tabitha Harris, 24 lives in [place] and works in a lab that apparently Research scientist (physical sciences) vaccines.  Son, Tabitha Harris, lives in [place], [place], he is an Acupuncturist.  He is 60.  Son, Tabitha Harris, 19, currently lives in the [place] where he is an Pensions consultant for [employer].  The patient's fourth child, a daughter, died from uterine cancer at the age of 44 in 11.  The patient has seven grandchildren.  She is not a Advice worker.   ADVANCED DIRECTIVES: in place  HEALTH MAINTENANCE: History  Substance Use Topics  . Smoking status: Former Games developer  . Smokeless tobacco: Not on file  . Alcohol Use: Yes     Colonoscopy:  PAP:  Bone density:  Lipid panel:  Allergies  Allergen Reactions  . Codeine     Current Outpatient Prescriptions  Medication Sig Dispense Refill  . amLODipine (NORVASC) 5 MG tablet Take 5 mg by mouth daily.        Marland Kitchen aspirin 81 MG tablet Take 81 mg by mouth daily.        Marland Kitchen atorvastatin (LIPITOR) 20 MG tablet Take 20 mg by mouth daily.        . bimatoprost (LUMIGAN) 0.01 % SOLN 1 drop at bedtime.      . calcium carbonate (OS-CAL) 600 MG TABS Take 600 mg by mouth 2 (two) times daily with a meal.        . dorzolamide (TRUSOPT) 2 % ophthalmic solution 1 drop 2 (two) times daily.        . dorzolamide-timolol (COSOPT) 22.3-6.8 MG/ML ophthalmic solution       . esomeprazole (NEXIUM) 40 MG capsule Take 40 mg by mouth daily before breakfast.        . fish oil-omega-3 fatty acids 1000 MG capsule Take 2 g by mouth daily.        Marland Kitchen letrozole (FEMARA) 2.5 MG tablet Take 2.5 mg by mouth daily.        Marland Kitchen levothyroxine (SYNTHROID, LEVOTHROID) 75 MCG tablet Take 75 mcg by mouth daily.        . Multiple Vitamins-Minerals (MULTIVITAMIN WITH MINERALS) tablet Take 1 tablet by mouth daily.          OBJECTIVE: Elderly white woman who  appears healthy Filed Vitals:   03/06/12 1138  BP: 133/78  Pulse: 81  Temp: 98.3 F (36.8 C)  Resp: 20     Body mass index is 21.96 kg/(m^2).    ECOG FS: 0  Sclerae unicteric Oropharynx clear No cervical or supraclavicular adenopathy Lungs no rales or rhonchi Heart regular rate and rhythm Abd benign MSK no focal spinal tenderness, no peripheral edema Neuro: nonfocal Breasts: The right breast is unremarkable. The left breast is status post lumpectomy. There is no evidence of local recurrence. The left axilla is clear.   LAB RESULTS: Lab Results  Component Value Date   WBC 5.7 02/28/2012   NEUTROABS 2.1 02/28/2012  HGB 11.3* 02/28/2012   HCT 32.8* 02/28/2012   MCV 105.7* 02/28/2012   PLT 250 02/28/2012      Chemistry      Component Value Date/Time   NA 142 02/28/2012 1203   NA 144 05/30/2010 0726   K 4.5 02/28/2012 1203   K 3.9 05/30/2010 0726   CL 109* 02/28/2012 1203   CL 109 05/30/2010 0726   CO2 29 02/28/2012 1203   CO2 28 05/09/2010 1202   BUN 14.0 02/28/2012 1203   BUN 18 05/30/2010 0726   CREATININE 0.8 02/28/2012 1203   CREATININE 0.9 05/30/2010 0726      Component Value Date/Time   CALCIUM 9.9 02/28/2012 1203   CALCIUM 9.2 05/09/2010 1202   ALKPHOS 58 02/28/2012 1203   ALKPHOS 51 08/07/2010 1408   AST 33 02/28/2012 1203   AST 30 08/07/2010 1408   ALT 25 02/28/2012 1203   ALT 23 08/07/2010 1408   BILITOT 0.96 02/28/2012 1203   BILITOT 0.6 08/07/2010 1408       Lab Results  Component Value Date   LABCA2 21 05/24/2010    No components found with this basename: [id number]    No results found for this basename: INR:1;PROTIME:1 in the last 168 hours  Urinalysis    Component Value Date/Time   COLORURINE YELLOW 05/09/2010 1205   APPEARANCEUR CLEAR 05/09/2010 1205   LABSPEC 1.007 05/09/2010 1205   PHURINE 7.5 05/09/2010 1205   HGBUR NEGATIVE 05/09/2010 1205   BILIRUBINUR NEGATIVE 05/09/2010 1205   KETONESUR NEGATIVE 05/09/2010 1205   PROTEINUR NEGATIVE 05/09/2010 1205    UROBILINOGEN 0.2 05/09/2010 1205   NITRITE NEGATIVE 05/09/2010 1205   LEUKOCYTESUR NEGATIVE MICROSCOPIC NOT DONE ON URINES WITH NEGATIVE PROTEIN, BLOOD, LEUKOCYTES, NITRITE, OR GLUCOSE <1000 mg/dL. 05/09/2010 1205    STUDIES: No results found. She is due for repeat mammography next month.  ASSESSMENT: 78 y.o. [place] woman status post left lumpectomy January of 2012 for a 1.5 cm grade 1 invasive lobular breast cancer, with positive margins cleared early February 2012, the tumor being strongly estrogen and progesterone receptor positive, MIB-1 of 14% and no HER-2 amplification, on letrozole since Feb of 2012   PLAN: Tahleah is doing terrific from a breast cancer point of view, and biologically she is at least 15 or 20 years younger than her stated age. She is also followed by Dr. Jamey Ripa and Dr. Vincente Poli as well as her primary care physician, so I am going to start seeing her on a once a year basis, but just to make sure that I see her after her mammography and not before her next appointment with me will be January of 2015. She knows to call for any problems that may develop before that visit.   Aries Townley C    03/06/2012

## 2012-03-06 NOTE — Telephone Encounter (Signed)
Pt is aware that she will be called when it is time to schedule the 2015 appts. Templates are not built for 2015 as yet. Pt understood

## 2012-03-10 ENCOUNTER — Other Ambulatory Visit: Payer: Self-pay | Admitting: Oncology

## 2012-03-10 DIAGNOSIS — Z853 Personal history of malignant neoplasm of breast: Secondary | ICD-10-CM

## 2012-04-07 ENCOUNTER — Ambulatory Visit
Admission: RE | Admit: 2012-04-07 | Discharge: 2012-04-07 | Disposition: A | Discharge status: Home / Self Care | Payer: Medicare Other | Source: Ambulatory Visit | Attending: Oncology | Admitting: Oncology

## 2012-04-07 DIAGNOSIS — Z853 Personal history of malignant neoplasm of breast: Secondary | ICD-10-CM

## 2012-07-02 DIAGNOSIS — E782 Mixed hyperlipidemia: Secondary | ICD-10-CM | POA: Diagnosis not present

## 2012-07-02 DIAGNOSIS — J449 Chronic obstructive pulmonary disease, unspecified: Secondary | ICD-10-CM | POA: Diagnosis not present

## 2012-07-14 DIAGNOSIS — D239 Other benign neoplasm of skin, unspecified: Secondary | ICD-10-CM | POA: Diagnosis not present

## 2012-07-14 DIAGNOSIS — Z85828 Personal history of other malignant neoplasm of skin: Secondary | ICD-10-CM | POA: Diagnosis not present

## 2012-07-14 DIAGNOSIS — L570 Actinic keratosis: Secondary | ICD-10-CM | POA: Diagnosis not present

## 2012-07-14 DIAGNOSIS — L819 Disorder of pigmentation, unspecified: Secondary | ICD-10-CM | POA: Diagnosis not present

## 2012-07-14 DIAGNOSIS — L821 Other seborrheic keratosis: Secondary | ICD-10-CM | POA: Diagnosis not present

## 2012-08-11 DIAGNOSIS — H26499 Other secondary cataract, unspecified eye: Secondary | ICD-10-CM | POA: Diagnosis not present

## 2012-08-11 DIAGNOSIS — H4010X0 Unspecified open-angle glaucoma, stage unspecified: Secondary | ICD-10-CM | POA: Diagnosis not present

## 2012-08-11 DIAGNOSIS — H43399 Other vitreous opacities, unspecified eye: Secondary | ICD-10-CM | POA: Diagnosis not present

## 2012-11-17 DIAGNOSIS — M899 Disorder of bone, unspecified: Secondary | ICD-10-CM | POA: Diagnosis not present

## 2013-01-12 DIAGNOSIS — L570 Actinic keratosis: Secondary | ICD-10-CM | POA: Diagnosis not present

## 2013-01-12 DIAGNOSIS — Z85828 Personal history of other malignant neoplasm of skin: Secondary | ICD-10-CM | POA: Diagnosis not present

## 2013-01-12 DIAGNOSIS — L608 Other nail disorders: Secondary | ICD-10-CM | POA: Diagnosis not present

## 2013-01-12 DIAGNOSIS — L821 Other seborrheic keratosis: Secondary | ICD-10-CM | POA: Diagnosis not present

## 2013-01-12 DIAGNOSIS — D1801 Hemangioma of skin and subcutaneous tissue: Secondary | ICD-10-CM | POA: Diagnosis not present

## 2013-01-13 DIAGNOSIS — E782 Mixed hyperlipidemia: Secondary | ICD-10-CM | POA: Diagnosis not present

## 2013-01-13 DIAGNOSIS — I779 Disorder of arteries and arterioles, unspecified: Secondary | ICD-10-CM | POA: Diagnosis not present

## 2013-01-13 DIAGNOSIS — J449 Chronic obstructive pulmonary disease, unspecified: Secondary | ICD-10-CM | POA: Diagnosis not present

## 2013-01-13 DIAGNOSIS — N390 Urinary tract infection, site not specified: Secondary | ICD-10-CM | POA: Diagnosis not present

## 2013-01-13 DIAGNOSIS — I6529 Occlusion and stenosis of unspecified carotid artery: Secondary | ICD-10-CM | POA: Diagnosis not present

## 2013-01-16 DIAGNOSIS — Z23 Encounter for immunization: Secondary | ICD-10-CM | POA: Diagnosis not present

## 2013-01-20 ENCOUNTER — Other Ambulatory Visit: Payer: Self-pay | Admitting: Internal Medicine

## 2013-01-20 DIAGNOSIS — I6529 Occlusion and stenosis of unspecified carotid artery: Secondary | ICD-10-CM | POA: Diagnosis not present

## 2013-01-20 DIAGNOSIS — E039 Hypothyroidism, unspecified: Secondary | ICD-10-CM | POA: Diagnosis not present

## 2013-01-20 DIAGNOSIS — R490 Dysphonia: Secondary | ICD-10-CM | POA: Diagnosis not present

## 2013-01-20 DIAGNOSIS — I10 Essential (primary) hypertension: Secondary | ICD-10-CM | POA: Diagnosis not present

## 2013-01-20 DIAGNOSIS — R1310 Dysphagia, unspecified: Secondary | ICD-10-CM | POA: Diagnosis not present

## 2013-01-20 DIAGNOSIS — Z23 Encounter for immunization: Secondary | ICD-10-CM | POA: Diagnosis not present

## 2013-01-20 DIAGNOSIS — I6523 Occlusion and stenosis of bilateral carotid arteries: Secondary | ICD-10-CM

## 2013-01-20 DIAGNOSIS — E782 Mixed hyperlipidemia: Secondary | ICD-10-CM | POA: Diagnosis not present

## 2013-01-21 ENCOUNTER — Ambulatory Visit
Admission: RE | Admit: 2013-01-21 | Discharge: 2013-01-21 | Disposition: A | Discharge status: Home / Self Care | Payer: Medicare Other | Source: Ambulatory Visit | Attending: Internal Medicine | Admitting: Internal Medicine

## 2013-01-21 DIAGNOSIS — I658 Occlusion and stenosis of other precerebral arteries: Secondary | ICD-10-CM | POA: Diagnosis not present

## 2013-01-21 DIAGNOSIS — I6523 Occlusion and stenosis of bilateral carotid arteries: Secondary | ICD-10-CM

## 2013-01-26 DIAGNOSIS — Z01419 Encounter for gynecological examination (general) (routine) without abnormal findings: Secondary | ICD-10-CM | POA: Diagnosis not present

## 2013-02-03 ENCOUNTER — Other Ambulatory Visit (HOSPITAL_COMMUNITY): Payer: Self-pay | Admitting: Internal Medicine

## 2013-02-03 DIAGNOSIS — I771 Stricture of artery: Secondary | ICD-10-CM

## 2013-02-05 ENCOUNTER — Telehealth (HOSPITAL_COMMUNITY): Payer: Self-pay | Admitting: Interventional Radiology

## 2013-02-05 NOTE — Telephone Encounter (Signed)
Called pt left VM asking her if she could change her appt time on 02/09/13 from 1 p.m. To 2 p.m. Asked her to call me back and let me know. JM

## 2013-02-09 ENCOUNTER — Ambulatory Visit (HOSPITAL_COMMUNITY)
Admission: RE | Admit: 2013-02-09 | Discharge: 2013-02-09 | Disposition: A | Discharge status: Home / Self Care | Payer: Medicare Other | Source: Ambulatory Visit | Attending: Internal Medicine | Admitting: Internal Medicine

## 2013-02-09 DIAGNOSIS — I6529 Occlusion and stenosis of unspecified carotid artery: Secondary | ICD-10-CM | POA: Diagnosis not present

## 2013-02-09 DIAGNOSIS — I771 Stricture of artery: Secondary | ICD-10-CM

## 2013-03-04 DIAGNOSIS — H4011X0 Primary open-angle glaucoma, stage unspecified: Secondary | ICD-10-CM | POA: Diagnosis not present

## 2013-03-13 ENCOUNTER — Other Ambulatory Visit (INDEPENDENT_AMBULATORY_CARE_PROVIDER_SITE_OTHER): Payer: Self-pay | Admitting: Surgery

## 2013-03-13 DIAGNOSIS — Z853 Personal history of malignant neoplasm of breast: Secondary | ICD-10-CM

## 2013-04-08 ENCOUNTER — Ambulatory Visit
Admission: RE | Admit: 2013-04-08 | Discharge: 2013-04-08 | Disposition: A | Discharge status: Home / Self Care | Payer: Medicare Other | Source: Ambulatory Visit | Attending: Surgery | Admitting: Surgery

## 2013-04-08 DIAGNOSIS — Z853 Personal history of malignant neoplasm of breast: Secondary | ICD-10-CM

## 2013-06-02 DIAGNOSIS — R079 Chest pain, unspecified: Secondary | ICD-10-CM | POA: Diagnosis not present

## 2013-06-02 DIAGNOSIS — R5381 Other malaise: Secondary | ICD-10-CM | POA: Diagnosis not present

## 2013-06-02 DIAGNOSIS — R05 Cough: Secondary | ICD-10-CM | POA: Diagnosis not present

## 2013-06-02 DIAGNOSIS — R059 Cough, unspecified: Secondary | ICD-10-CM | POA: Diagnosis not present

## 2013-06-02 DIAGNOSIS — R5383 Other fatigue: Secondary | ICD-10-CM | POA: Diagnosis not present

## 2013-06-02 DIAGNOSIS — J988 Other specified respiratory disorders: Secondary | ICD-10-CM | POA: Diagnosis not present

## 2013-06-22 DIAGNOSIS — H9209 Otalgia, unspecified ear: Secondary | ICD-10-CM | POA: Diagnosis not present

## 2013-06-23 DIAGNOSIS — H6980 Other specified disorders of Eustachian tube, unspecified ear: Secondary | ICD-10-CM | POA: Diagnosis not present

## 2013-06-23 DIAGNOSIS — H902 Conductive hearing loss, unspecified: Secondary | ICD-10-CM | POA: Diagnosis not present

## 2013-06-23 DIAGNOSIS — H6500 Acute serous otitis media, unspecified ear: Secondary | ICD-10-CM | POA: Diagnosis not present

## 2013-07-13 ENCOUNTER — Other Ambulatory Visit: Payer: Self-pay | Admitting: Dermatology

## 2013-07-13 DIAGNOSIS — C44711 Basal cell carcinoma of skin of unspecified lower limb, including hip: Secondary | ICD-10-CM | POA: Diagnosis not present

## 2013-07-13 DIAGNOSIS — L570 Actinic keratosis: Secondary | ICD-10-CM | POA: Diagnosis not present

## 2013-07-13 DIAGNOSIS — L821 Other seborrheic keratosis: Secondary | ICD-10-CM | POA: Diagnosis not present

## 2013-07-13 DIAGNOSIS — Z85828 Personal history of other malignant neoplasm of skin: Secondary | ICD-10-CM | POA: Diagnosis not present

## 2013-07-13 DIAGNOSIS — L819 Disorder of pigmentation, unspecified: Secondary | ICD-10-CM | POA: Diagnosis not present

## 2013-07-13 DIAGNOSIS — D485 Neoplasm of uncertain behavior of skin: Secondary | ICD-10-CM | POA: Diagnosis not present

## 2013-07-13 DIAGNOSIS — Q828 Other specified congenital malformations of skin: Secondary | ICD-10-CM | POA: Diagnosis not present

## 2013-07-31 ENCOUNTER — Other Ambulatory Visit: Payer: Self-pay | Admitting: *Deleted

## 2013-07-31 MED ORDER — LETROZOLE 2.5 MG PO TABS: 2.5000 mg | ORAL_TABLET | Freq: Every day | ORAL | Status: DC

## 2013-08-03 ENCOUNTER — Other Ambulatory Visit: Payer: Self-pay | Admitting: *Deleted

## 2013-08-03 MED ORDER — LETROZOLE 2.5 MG PO TABS: 2.5000 mg | ORAL_TABLET | Freq: Every day | ORAL | Status: DC

## 2013-09-02 DIAGNOSIS — H26499 Other secondary cataract, unspecified eye: Secondary | ICD-10-CM | POA: Diagnosis not present

## 2013-09-02 DIAGNOSIS — H04129 Dry eye syndrome of unspecified lacrimal gland: Secondary | ICD-10-CM | POA: Diagnosis not present

## 2013-09-02 DIAGNOSIS — H4011X0 Primary open-angle glaucoma, stage unspecified: Secondary | ICD-10-CM | POA: Diagnosis not present

## 2013-09-02 DIAGNOSIS — H43399 Other vitreous opacities, unspecified eye: Secondary | ICD-10-CM | POA: Diagnosis not present

## 2014-01-14 DIAGNOSIS — L570 Actinic keratosis: Secondary | ICD-10-CM | POA: Diagnosis not present

## 2014-01-14 DIAGNOSIS — L821 Other seborrheic keratosis: Secondary | ICD-10-CM | POA: Diagnosis not present

## 2014-01-14 DIAGNOSIS — L565 Disseminated superficial actinic porokeratosis (DSAP): Secondary | ICD-10-CM | POA: Diagnosis not present

## 2014-01-14 DIAGNOSIS — L820 Inflamed seborrheic keratosis: Secondary | ICD-10-CM | POA: Diagnosis not present

## 2014-01-14 DIAGNOSIS — L819 Disorder of pigmentation, unspecified: Secondary | ICD-10-CM | POA: Diagnosis not present

## 2014-01-14 DIAGNOSIS — Z85828 Personal history of other malignant neoplasm of skin: Secondary | ICD-10-CM | POA: Diagnosis not present

## 2014-01-19 DIAGNOSIS — Z1331 Encounter for screening for depression: Secondary | ICD-10-CM | POA: Diagnosis not present

## 2014-01-19 DIAGNOSIS — Z79899 Other long term (current) drug therapy: Secondary | ICD-10-CM | POA: Diagnosis not present

## 2014-01-19 DIAGNOSIS — E782 Mixed hyperlipidemia: Secondary | ICD-10-CM | POA: Diagnosis not present

## 2014-01-19 DIAGNOSIS — E039 Hypothyroidism, unspecified: Secondary | ICD-10-CM | POA: Diagnosis not present

## 2014-01-19 DIAGNOSIS — I10 Essential (primary) hypertension: Secondary | ICD-10-CM | POA: Diagnosis not present

## 2014-01-19 DIAGNOSIS — Z0000 Encounter for general adult medical examination without abnormal findings: Secondary | ICD-10-CM | POA: Diagnosis not present

## 2014-01-26 DIAGNOSIS — E782 Mixed hyperlipidemia: Secondary | ICD-10-CM | POA: Diagnosis not present

## 2014-01-26 DIAGNOSIS — Z23 Encounter for immunization: Secondary | ICD-10-CM | POA: Diagnosis not present

## 2014-01-26 DIAGNOSIS — I779 Disorder of arteries and arterioles, unspecified: Secondary | ICD-10-CM | POA: Diagnosis not present

## 2014-01-26 DIAGNOSIS — I350 Nonrheumatic aortic (valve) stenosis: Secondary | ICD-10-CM | POA: Diagnosis not present

## 2014-01-26 DIAGNOSIS — I10 Essential (primary) hypertension: Secondary | ICD-10-CM | POA: Diagnosis not present

## 2014-01-29 DIAGNOSIS — Z23 Encounter for immunization: Secondary | ICD-10-CM | POA: Diagnosis not present

## 2014-02-16 DIAGNOSIS — I350 Nonrheumatic aortic (valve) stenosis: Secondary | ICD-10-CM | POA: Diagnosis not present

## 2014-02-16 DIAGNOSIS — R0602 Shortness of breath: Secondary | ICD-10-CM | POA: Diagnosis not present

## 2014-02-16 DIAGNOSIS — R530 Neoplastic (malignant) related fatigue: Secondary | ICD-10-CM | POA: Diagnosis not present

## 2014-03-08 ENCOUNTER — Other Ambulatory Visit: Payer: Self-pay | Admitting: Obstetrics and Gynecology

## 2014-03-08 DIAGNOSIS — Z124 Encounter for screening for malignant neoplasm of cervix: Secondary | ICD-10-CM | POA: Diagnosis not present

## 2014-03-09 ENCOUNTER — Other Ambulatory Visit: Payer: Self-pay | Admitting: Internal Medicine

## 2014-03-09 ENCOUNTER — Other Ambulatory Visit: Payer: Self-pay | Admitting: Surgery

## 2014-03-09 DIAGNOSIS — Z9889 Other specified postprocedural states: Secondary | ICD-10-CM

## 2014-03-09 DIAGNOSIS — Z853 Personal history of malignant neoplasm of breast: Secondary | ICD-10-CM

## 2014-03-09 DIAGNOSIS — H4011X4 Primary open-angle glaucoma, indeterminate stage: Secondary | ICD-10-CM | POA: Diagnosis not present

## 2014-03-09 LAB — CYTOLOGY - PAP

## 2014-04-09 ENCOUNTER — Ambulatory Visit
Admission: RE | Admit: 2014-04-09 | Discharge: 2014-04-09 | Disposition: A | Discharge status: Home / Self Care | Payer: Medicare Other | Source: Ambulatory Visit

## 2014-04-09 DIAGNOSIS — Z853 Personal history of malignant neoplasm of breast: Secondary | ICD-10-CM

## 2014-04-09 DIAGNOSIS — Z9889 Other specified postprocedural states: Secondary | ICD-10-CM

## 2014-05-11 DIAGNOSIS — I350 Nonrheumatic aortic (valve) stenosis: Secondary | ICD-10-CM | POA: Diagnosis not present

## 2014-05-11 DIAGNOSIS — E782 Mixed hyperlipidemia: Secondary | ICD-10-CM | POA: Diagnosis not present

## 2014-05-18 DIAGNOSIS — I10 Essential (primary) hypertension: Secondary | ICD-10-CM | POA: Diagnosis not present

## 2014-05-18 DIAGNOSIS — E782 Mixed hyperlipidemia: Secondary | ICD-10-CM | POA: Diagnosis not present

## 2014-05-18 DIAGNOSIS — I779 Disorder of arteries and arterioles, unspecified: Secondary | ICD-10-CM | POA: Diagnosis not present

## 2014-05-18 DIAGNOSIS — R0989 Other specified symptoms and signs involving the circulatory and respiratory systems: Secondary | ICD-10-CM | POA: Diagnosis not present

## 2014-07-26 DIAGNOSIS — L570 Actinic keratosis: Secondary | ICD-10-CM | POA: Diagnosis not present

## 2014-07-26 DIAGNOSIS — L308 Other specified dermatitis: Secondary | ICD-10-CM | POA: Diagnosis not present

## 2014-07-26 DIAGNOSIS — L821 Other seborrheic keratosis: Secondary | ICD-10-CM | POA: Diagnosis not present

## 2014-07-26 DIAGNOSIS — L812 Freckles: Secondary | ICD-10-CM | POA: Diagnosis not present

## 2014-07-26 DIAGNOSIS — Z85828 Personal history of other malignant neoplasm of skin: Secondary | ICD-10-CM | POA: Diagnosis not present

## 2014-09-07 DIAGNOSIS — E782 Mixed hyperlipidemia: Secondary | ICD-10-CM | POA: Diagnosis not present

## 2014-09-07 DIAGNOSIS — I10 Essential (primary) hypertension: Secondary | ICD-10-CM | POA: Diagnosis not present

## 2014-09-07 DIAGNOSIS — I779 Disorder of arteries and arterioles, unspecified: Secondary | ICD-10-CM | POA: Diagnosis not present

## 2014-09-07 DIAGNOSIS — R0989 Other specified symptoms and signs involving the circulatory and respiratory systems: Secondary | ICD-10-CM | POA: Diagnosis not present

## 2014-09-10 DIAGNOSIS — H4011X1 Primary open-angle glaucoma, mild stage: Secondary | ICD-10-CM | POA: Diagnosis not present

## 2014-09-14 DIAGNOSIS — I10 Essential (primary) hypertension: Secondary | ICD-10-CM | POA: Diagnosis not present

## 2014-09-14 DIAGNOSIS — R0989 Other specified symptoms and signs involving the circulatory and respiratory systems: Secondary | ICD-10-CM | POA: Diagnosis not present

## 2014-09-14 DIAGNOSIS — E782 Mixed hyperlipidemia: Secondary | ICD-10-CM | POA: Diagnosis not present

## 2014-09-14 DIAGNOSIS — I350 Nonrheumatic aortic (valve) stenosis: Secondary | ICD-10-CM | POA: Diagnosis not present

## 2014-11-22 DIAGNOSIS — M8588 Other specified disorders of bone density and structure, other site: Secondary | ICD-10-CM | POA: Diagnosis not present

## 2014-11-22 DIAGNOSIS — N958 Other specified menopausal and perimenopausal disorders: Secondary | ICD-10-CM | POA: Diagnosis not present

## 2015-02-08 DIAGNOSIS — R0989 Other specified symptoms and signs involving the circulatory and respiratory systems: Secondary | ICD-10-CM | POA: Diagnosis not present

## 2015-02-08 DIAGNOSIS — Z23 Encounter for immunization: Secondary | ICD-10-CM | POA: Diagnosis not present

## 2015-02-08 DIAGNOSIS — I350 Nonrheumatic aortic (valve) stenosis: Secondary | ICD-10-CM | POA: Diagnosis not present

## 2015-02-08 DIAGNOSIS — I779 Disorder of arteries and arterioles, unspecified: Secondary | ICD-10-CM | POA: Diagnosis not present

## 2015-02-08 DIAGNOSIS — E782 Mixed hyperlipidemia: Secondary | ICD-10-CM | POA: Diagnosis not present

## 2015-02-08 DIAGNOSIS — I10 Essential (primary) hypertension: Secondary | ICD-10-CM | POA: Diagnosis not present

## 2015-02-11 DIAGNOSIS — D692 Other nonthrombocytopenic purpura: Secondary | ICD-10-CM | POA: Diagnosis not present

## 2015-02-11 DIAGNOSIS — L821 Other seborrheic keratosis: Secondary | ICD-10-CM | POA: Diagnosis not present

## 2015-02-11 DIAGNOSIS — L812 Freckles: Secondary | ICD-10-CM | POA: Diagnosis not present

## 2015-02-11 DIAGNOSIS — L578 Other skin changes due to chronic exposure to nonionizing radiation: Secondary | ICD-10-CM | POA: Diagnosis not present

## 2015-02-11 DIAGNOSIS — Z85828 Personal history of other malignant neoplasm of skin: Secondary | ICD-10-CM | POA: Diagnosis not present

## 2015-02-11 DIAGNOSIS — D1801 Hemangioma of skin and subcutaneous tissue: Secondary | ICD-10-CM | POA: Diagnosis not present

## 2015-02-15 DIAGNOSIS — I10 Essential (primary) hypertension: Secondary | ICD-10-CM | POA: Diagnosis not present

## 2015-02-15 DIAGNOSIS — I779 Disorder of arteries and arterioles, unspecified: Secondary | ICD-10-CM | POA: Diagnosis not present

## 2015-02-15 DIAGNOSIS — I350 Nonrheumatic aortic (valve) stenosis: Secondary | ICD-10-CM | POA: Diagnosis not present

## 2015-02-15 DIAGNOSIS — E782 Mixed hyperlipidemia: Secondary | ICD-10-CM | POA: Diagnosis not present

## 2015-02-18 ENCOUNTER — Other Ambulatory Visit: Payer: Self-pay | Admitting: Internal Medicine

## 2015-02-18 DIAGNOSIS — R1314 Dysphagia, pharyngoesophageal phase: Secondary | ICD-10-CM

## 2015-02-25 ENCOUNTER — Other Ambulatory Visit: Payer: Medicare Other

## 2015-02-28 DIAGNOSIS — Z1212 Encounter for screening for malignant neoplasm of rectum: Secondary | ICD-10-CM | POA: Diagnosis not present

## 2015-03-01 ENCOUNTER — Ambulatory Visit
Admission: RE | Admit: 2015-03-01 | Discharge: 2015-03-01 | Disposition: A | Discharge status: Home / Self Care | Payer: Medicare Other | Source: Ambulatory Visit | Attending: Internal Medicine | Admitting: Internal Medicine

## 2015-03-01 ENCOUNTER — Other Ambulatory Visit: Payer: Self-pay | Admitting: Internal Medicine

## 2015-03-01 DIAGNOSIS — R1314 Dysphagia, pharyngoesophageal phase: Secondary | ICD-10-CM | POA: Diagnosis not present

## 2015-03-08 DIAGNOSIS — R1313 Dysphagia, pharyngeal phase: Secondary | ICD-10-CM | POA: Diagnosis not present

## 2015-03-15 DIAGNOSIS — H401132 Primary open-angle glaucoma, bilateral, moderate stage: Secondary | ICD-10-CM | POA: Diagnosis not present

## 2015-03-22 DIAGNOSIS — I10 Essential (primary) hypertension: Secondary | ICD-10-CM | POA: Diagnosis not present

## 2015-03-22 DIAGNOSIS — D500 Iron deficiency anemia secondary to blood loss (chronic): Secondary | ICD-10-CM | POA: Diagnosis not present

## 2015-03-29 DIAGNOSIS — Z01419 Encounter for gynecological examination (general) (routine) without abnormal findings: Secondary | ICD-10-CM | POA: Diagnosis not present

## 2015-03-29 DIAGNOSIS — Z6821 Body mass index (BMI) 21.0-21.9, adult: Secondary | ICD-10-CM | POA: Diagnosis not present

## 2015-03-30 DIAGNOSIS — I350 Nonrheumatic aortic (valve) stenosis: Secondary | ICD-10-CM | POA: Diagnosis not present

## 2015-03-30 DIAGNOSIS — R1314 Dysphagia, pharyngoesophageal phase: Secondary | ICD-10-CM | POA: Diagnosis not present

## 2015-03-30 DIAGNOSIS — I10 Essential (primary) hypertension: Secondary | ICD-10-CM | POA: Diagnosis not present

## 2015-03-30 DIAGNOSIS — E782 Mixed hyperlipidemia: Secondary | ICD-10-CM | POA: Diagnosis not present

## 2015-04-04 ENCOUNTER — Other Ambulatory Visit: Payer: Self-pay | Admitting: Internal Medicine

## 2015-04-04 DIAGNOSIS — Z853 Personal history of malignant neoplasm of breast: Secondary | ICD-10-CM

## 2015-05-20 ENCOUNTER — Ambulatory Visit
Admission: RE | Admit: 2015-05-20 | Discharge: 2015-05-20 | Disposition: A | Discharge status: Home / Self Care | Payer: Medicare Other | Source: Ambulatory Visit | Attending: Internal Medicine | Admitting: Internal Medicine

## 2015-05-20 ENCOUNTER — Other Ambulatory Visit: Payer: Self-pay | Admitting: Internal Medicine

## 2015-05-20 DIAGNOSIS — R928 Other abnormal and inconclusive findings on diagnostic imaging of breast: Secondary | ICD-10-CM | POA: Diagnosis not present

## 2015-05-20 DIAGNOSIS — Z853 Personal history of malignant neoplasm of breast: Secondary | ICD-10-CM

## 2015-09-20 ENCOUNTER — Inpatient Hospital Stay (HOSPITAL_COMMUNITY)
Admission: EM | Admit: 2015-09-20 | Discharge: 2015-09-25 | DRG: 038 | Disposition: A | Discharge status: Home Health | Payer: Medicare Other | Attending: Internal Medicine | Admitting: Internal Medicine

## 2015-09-20 ENCOUNTER — Observation Stay (HOSPITAL_COMMUNITY): Payer: Medicare Other

## 2015-09-20 ENCOUNTER — Encounter (HOSPITAL_COMMUNITY): Payer: Self-pay | Admitting: *Deleted

## 2015-09-20 ENCOUNTER — Emergency Department (HOSPITAL_COMMUNITY): Payer: Medicare Other

## 2015-09-20 DIAGNOSIS — I6521 Occlusion and stenosis of right carotid artery: Secondary | ICD-10-CM | POA: Diagnosis present

## 2015-09-20 DIAGNOSIS — I5032 Chronic diastolic (congestive) heart failure: Secondary | ICD-10-CM | POA: Diagnosis present

## 2015-09-20 DIAGNOSIS — J3801 Paralysis of vocal cords and larynx, unilateral: Secondary | ICD-10-CM | POA: Diagnosis not present

## 2015-09-20 DIAGNOSIS — E785 Hyperlipidemia, unspecified: Secondary | ICD-10-CM | POA: Insufficient documentation

## 2015-09-20 DIAGNOSIS — D62 Acute posthemorrhagic anemia: Secondary | ICD-10-CM | POA: Insufficient documentation

## 2015-09-20 DIAGNOSIS — Z01810 Encounter for preprocedural cardiovascular examination: Secondary | ICD-10-CM | POA: Insufficient documentation

## 2015-09-20 DIAGNOSIS — I6529 Occlusion and stenosis of unspecified carotid artery: Secondary | ICD-10-CM | POA: Insufficient documentation

## 2015-09-20 DIAGNOSIS — I110 Hypertensive heart disease with heart failure: Secondary | ICD-10-CM | POA: Diagnosis not present

## 2015-09-20 DIAGNOSIS — I10 Essential (primary) hypertension: Secondary | ICD-10-CM | POA: Insufficient documentation

## 2015-09-20 DIAGNOSIS — H547 Unspecified visual loss: Secondary | ICD-10-CM | POA: Diagnosis not present

## 2015-09-20 DIAGNOSIS — I700 Atherosclerosis of aorta: Secondary | ICD-10-CM | POA: Diagnosis present

## 2015-09-20 DIAGNOSIS — E039 Hypothyroidism, unspecified: Secondary | ICD-10-CM | POA: Diagnosis present

## 2015-09-20 DIAGNOSIS — K219 Gastro-esophageal reflux disease without esophagitis: Secondary | ICD-10-CM | POA: Insufficient documentation

## 2015-09-20 DIAGNOSIS — G459 Transient cerebral ischemic attack, unspecified: Secondary | ICD-10-CM | POA: Diagnosis not present

## 2015-09-20 DIAGNOSIS — H3411 Central retinal artery occlusion, right eye: Secondary | ICD-10-CM | POA: Diagnosis not present

## 2015-09-20 DIAGNOSIS — G453 Amaurosis fugax: Secondary | ICD-10-CM | POA: Diagnosis not present

## 2015-09-20 DIAGNOSIS — Z79899 Other long term (current) drug therapy: Secondary | ICD-10-CM

## 2015-09-20 DIAGNOSIS — H409 Unspecified glaucoma: Secondary | ICD-10-CM | POA: Diagnosis present

## 2015-09-20 DIAGNOSIS — H40059 Ocular hypertension, unspecified eye: Secondary | ICD-10-CM | POA: Diagnosis present

## 2015-09-20 DIAGNOSIS — R2990 Unspecified symptoms and signs involving the nervous system: Secondary | ICD-10-CM | POA: Diagnosis present

## 2015-09-20 DIAGNOSIS — Z8673 Personal history of transient ischemic attack (TIA), and cerebral infarction without residual deficits: Secondary | ICD-10-CM | POA: Diagnosis present

## 2015-09-20 DIAGNOSIS — Z7982 Long term (current) use of aspirin: Secondary | ICD-10-CM

## 2015-09-20 DIAGNOSIS — Z853 Personal history of malignant neoplasm of breast: Secondary | ICD-10-CM

## 2015-09-20 LAB — DIFFERENTIAL
BASOS PCT: 2 %
Basophils Absolute: 0.1 10*3/uL (ref 0.0–0.1)
EOS PCT: 4 %
Eosinophils Absolute: 0.2 10*3/uL (ref 0.0–0.7)
Lymphocytes Relative: 37 %
Lymphs Abs: 2.3 10*3/uL (ref 0.7–4.0)
MONO ABS: 0.8 10*3/uL (ref 0.1–1.0)
Monocytes Relative: 13 %
Neutro Abs: 2.7 10*3/uL (ref 1.7–7.7)
Neutrophils Relative %: 44 %

## 2015-09-20 LAB — COMPREHENSIVE METABOLIC PANEL
ALT: 22 U/L (ref 14–54)
ANION GAP: 7 (ref 5–15)
AST: 27 U/L (ref 15–41)
Albumin: 3.9 g/dL (ref 3.5–5.0)
Alkaline Phosphatase: 47 U/L (ref 38–126)
BUN: 11 mg/dL (ref 6–20)
CALCIUM: 9.5 mg/dL (ref 8.9–10.3)
CHLORIDE: 106 mmol/L (ref 101–111)
CO2: 27 mmol/L (ref 22–32)
Creatinine, Ser: 0.8 mg/dL (ref 0.44–1.00)
GFR calc non Af Amer: >60 mL/min (ref >60)
Glucose, Bld: 101 mg/dL — ABNORMAL HIGH (ref 65–99)
Potassium: 4.1 mmol/L (ref 3.5–5.1)
SODIUM: 140 mmol/L (ref 135–145)
Total Bilirubin: 1.2 mg/dL (ref 0.3–1.2)
Total Protein: 6.7 g/dL (ref 6.5–8.1)

## 2015-09-20 LAB — I-STAT CHEM 8, ED
BUN: 13 mg/dL (ref 6–20)
CALCIUM ION: 1.23 mmol/L (ref 1.13–1.30)
CHLORIDE: 104 mmol/L (ref 101–111)
Creatinine, Ser: 0.7 mg/dL (ref 0.44–1.00)
Glucose, Bld: 98 mg/dL (ref 65–99)
HCT: 34 % — ABNORMAL LOW (ref 36.0–46.0)
Hemoglobin: 11.6 g/dL — ABNORMAL LOW (ref 12.0–15.0)
POTASSIUM: 4.2 mmol/L (ref 3.5–5.1)
SODIUM: 143 mmol/L (ref 135–145)
TCO2: 26 mmol/L (ref 0–100)

## 2015-09-20 LAB — CBC
HCT: 34 % — ABNORMAL LOW (ref 36.0–46.0)
Hemoglobin: 11.4 g/dL — ABNORMAL LOW (ref 12.0–15.0)
MCH: 34.5 pg — AB (ref 26.0–34.0)
MCHC: 33.5 g/dL (ref 30.0–36.0)
MCV: 103 fL — AB (ref 78.0–100.0)
PLATELETS: 302 10*3/uL (ref 150–400)
RBC: 3.3 MIL/uL — ABNORMAL LOW (ref 3.87–5.11)
RDW: 14.6 % (ref 11.5–15.5)
WBC: 6.1 10*3/uL (ref 4.0–10.5)

## 2015-09-20 LAB — I-STAT TROPONIN, ED: TROPONIN I, POC: 0.01 ng/mL (ref 0.00–0.08)

## 2015-09-20 LAB — C-REACTIVE PROTEIN: CRP: <0.5 mg/dL (ref <1.0)

## 2015-09-20 LAB — SEDIMENTATION RATE: SED RATE: 14 mm/h (ref 0–22)

## 2015-09-20 LAB — APTT: aPTT: 29 seconds (ref 24–37)

## 2015-09-20 LAB — PROTIME-INR
INR: 1.07 (ref 0.00–1.49)
PROTHROMBIN TIME: 14.1 s (ref 11.6–15.2)

## 2015-09-20 MED ORDER — ATORVASTATIN CALCIUM 10 MG PO TABS
20.0000 mg | ORAL_TABLET | Freq: Every day | ORAL | Status: DC
  Administered 2015-09-21 – 2015-09-24 (×4): 20 mg via ORAL
  Filled 2015-09-20 (×2): qty 1
  Filled 2015-09-20 (×2): qty 2
  Filled 2015-09-20: qty 1

## 2015-09-20 MED ORDER — CALCIUM CARBONATE 600 MG PO TABS: 600.0000 mg | ORAL_TABLET | Freq: Two times a day (BID) | ORAL | Status: DC

## 2015-09-20 MED ORDER — HEPARIN SODIUM (PORCINE) 5000 UNIT/ML IJ SOLN
5000.0000 [IU] | Freq: Three times a day (TID) | INTRAMUSCULAR | Status: DC
  Administered 2015-09-20 – 2015-09-22 (×6): 5000 [IU] via SUBCUTANEOUS
  Filled 2015-09-20 (×7): qty 1

## 2015-09-20 MED ORDER — OMEGA-3-ACID ETHYL ESTERS 1 G PO CAPS
2.0000 g | ORAL_CAPSULE | Freq: Every day | ORAL | Status: DC
  Administered 2015-09-22 – 2015-09-25 (×3): 2 g via ORAL
  Filled 2015-09-20 (×4): qty 2

## 2015-09-20 MED ORDER — OMEGA-3 FATTY ACIDS 1000 MG PO CAPS: 2.0000 g | ORAL_CAPSULE | Freq: Every day | ORAL | Status: DC

## 2015-09-20 MED ORDER — STROKE: EARLY STAGES OF RECOVERY BOOK
Freq: Once | Status: AC
  Administered 2015-09-21: 01:00:00

## 2015-09-20 MED ORDER — AMLODIPINE BESYLATE 5 MG PO TABS
5.0000 mg | ORAL_TABLET | Freq: Every day | ORAL | Status: DC
  Administered 2015-09-21 – 2015-09-25 (×4): 5 mg via ORAL
  Filled 2015-09-20 (×4): qty 1

## 2015-09-20 MED ORDER — DORZOLAMIDE HCL 2 % OP SOLN
1.0000 [drp] | Freq: Two times a day (BID) | OPHTHALMIC | Status: DC
  Administered 2015-09-20 – 2015-09-25 (×9): 1 [drp] via OPHTHALMIC
  Filled 2015-09-20 (×3): qty 10

## 2015-09-20 MED ORDER — LEVOTHYROXINE SODIUM 75 MCG PO TABS
75.0000 ug | ORAL_TABLET | Freq: Every day | ORAL | Status: DC
  Administered 2015-09-21 – 2015-09-25 (×4): 75 ug via ORAL
  Filled 2015-09-20 (×5): qty 1

## 2015-09-20 MED ORDER — DORZOLAMIDE HCL-TIMOLOL MAL 2-0.5 % OP SOLN
1.0000 [drp] | Freq: Two times a day (BID) | OPHTHALMIC | Status: DC
Start: 2015-09-20 — End: 2015-09-20
  Filled 2015-09-20: qty 10

## 2015-09-20 MED ORDER — ASPIRIN 81 MG PO TABS: 81.0000 mg | ORAL_TABLET | Freq: Every day | ORAL | Status: DC

## 2015-09-20 MED ORDER — ASPIRIN 81 MG PO CHEW
81.0000 mg | CHEWABLE_TABLET | Freq: Every day | ORAL | Status: DC
  Administered 2015-09-20 – 2015-09-25 (×5): 81 mg via ORAL
  Filled 2015-09-20 (×5): qty 1

## 2015-09-20 MED ORDER — MULTI-VITAMIN/MINERALS PO TABS: 1.0000 | ORAL_TABLET | Freq: Every day | ORAL | Status: DC

## 2015-09-20 MED ORDER — ADULT MULTIVITAMIN W/MINERALS CH
1.0000 | ORAL_TABLET | Freq: Every day | ORAL | Status: DC
  Administered 2015-09-21 – 2015-09-25 (×4): 1 via ORAL
  Filled 2015-09-20 (×4): qty 1

## 2015-09-20 MED ORDER — SENNOSIDES-DOCUSATE SODIUM 8.6-50 MG PO TABS: 1.0000 | ORAL_TABLET | Freq: Every evening | ORAL | Status: DC | PRN

## 2015-09-20 MED ORDER — PANTOPRAZOLE SODIUM 40 MG PO TBEC
40.0000 mg | DELAYED_RELEASE_TABLET | Freq: Every day | ORAL | Status: DC
  Administered 2015-09-20 – 2015-09-25 (×4): 40 mg via ORAL
  Filled 2015-09-20 (×4): qty 1

## 2015-09-20 MED ORDER — LATANOPROST 0.005 % OP SOLN
1.0000 [drp] | Freq: Every day | OPHTHALMIC | Status: DC
Start: 2015-09-20 — End: 2015-09-25
  Administered 2015-09-20 – 2015-09-24 (×5): 1 [drp] via OPHTHALMIC
  Filled 2015-09-20 (×2): qty 2.5

## 2015-09-20 MED ORDER — CALCIUM CARBONATE 1250 (500 CA) MG PO TABS
1.0000 | ORAL_TABLET | Freq: Two times a day (BID) | ORAL | Status: DC
  Administered 2015-09-21 – 2015-09-25 (×8): 500 mg via ORAL
  Filled 2015-09-20 (×8): qty 1

## 2015-09-20 NOTE — Consult Note (Signed)
Neurology Consultation Reason for Consult: Transient vision loss Referring Physician: Hillary Bow  CC: Transient right eye visual loss  History is obtained from: Patient  HPI: Tabitha Harris is a 82 y.o. female he was in her normal state of health earlier when she had transient right eye visual loss. She states that she was reading something when suddenly the right eye went completely out. She states that it was both sides of the field in the right eye, not the right visual field. She then fell or she saw something kind of move across her vision and then the vision fairly quickly came back. The left so lasted 2-3 minutes. She is completely back to baseline. She has never had anything like this happen before.   LKW: This morning, unclear time tpa given?: no, resolve symptoms    ROS: A 14 point ROS was performed and is negative except as noted in the HPI.   Past Medical History  Diagnosis Date  . History of breast cancer     left  . Hypertension   . Hyperlipidemia   . GERD (gastroesophageal reflux disease)   . Thyroid disease     hypothyroidism  . Hernia      Family History  Problem Relation Age of Onset  . Cancer Mother     breast     Social History:  reports that she has never smoked. She does not have any smokeless tobacco history on file. She reports that she drinks alcohol. She reports that she does not use illicit drugs.   Exam: Current vital signs: BP 159/62 mmHg  Pulse 88  Temp(Src) 98 F (36.7 C) (Oral)  Resp 48  Wt 46.72 kg (103 lb)  SpO2 98% Vital signs in last 24 hours: Temp:  [97.7 F (36.5 C)-98 F (36.7 C)] 98 F (36.7 C) (05/30 2000) Pulse Rate:  [78-102] 88 (05/30 2000) Resp:  [14-48] 48 (05/30 1857) BP: (121-172)/(62-91) 159/62 mmHg (05/30 2000) SpO2:  [95 %-100 %] 98 % (05/30 2000) Weight:  [46.72 kg (103 lb)] 46.72 kg (103 lb) (05/30 1420)   Physical Exam  Constitutional: Appears well-developed and well-nourished.  Psych: Affect  appropriate to situation Eyes: No scleral injection HENT: No OP obstrucion Head: Normocephalic.  Cardiovascular: Normal rate and regular rhythm.  Respiratory: Effort normal and breath sounds normal to anterior ascultation GI: Soft.  No distension. There is no tenderness.  Skin: WDI  Neuro: Mental Status: Patient is awake, alert, oriented to person, place, month, year, and situation. Patient is able to give a clear and coherent history. No signs of aphasia or neglect Cranial Nerves: II: Visual Fields are full. Pupils are equal, round, and reactive to light.   III,IV, VI: EOMI without ptosis or diploplia.  V: Facial sensation is symmetric to temperature VII: Facial movement is symmetric.  VIII: hearing is intact to voice X: Uvula elevates symmetrically XI: Shoulder shrug is symmetric. XII: tongue is midline without atrophy or fasciculations.  Motor: Tone is normal. Bulk is normal. 5/5 strength was present in all four extremities.  Sensory: Sensation is symmetric to light touch and temperature in the arms and legs. Deep Tendon Reflexes: 2+ and symmetric in the biceps and patellae.  Plantars: Toes are downgoing bilaterally.  Cerebellar: FNF and HKS are intact bilaterally      I have reviewed labs in epic and the results pertinent to this consultation are: ESR, CRP normal  I have reviewed the images obtained: CT head-normal  Impression: 82 year old female with amaurosis  fugax. ESR, CRP normal so temporal arteritis is unlikely. I suspect that this is likely an embolic phenomenon. She is receiving a workup.  Recommendations: 1. HgbA1c, fasting lipid panel 2. MRI, MRA  of the brain without contrast 3. Frequent neuro checks 4. Echocardiogram 5. Carotid dopplers 6. Prophylactic therapy-Antiplatelet med: Aspirin - dose '325mg'$  PO or '300mg'$  PR 7. Risk factor modification 8. Telemetry monitoring 9. PT consult, OT consult, Speech consult 10. please page stroke NP  Or  PA  Or MD   M-F from 8am -4 pm starting 5/31 as this patient will be followed by the stroke team at this point.   You can look them up on [email or web address]      Roland Rack, MD [facility] [phone]  If 7pm- 7am, please page neurology on call as listed in [place].

## 2015-09-20 NOTE — H&P (Signed)
History and Physical    Tabitha Harris:[id number] DOB: [birth year] DOA: 09/20/2015  PCP: Merrilee Seashore, MD Patient coming from: Home  Chief Complaint: ttransient vision loss right eye field for minutes yesterday (day prior to admission)  HPI: Tabitha Harris is a 82 y.o. female  Who presented complaining of transient vision loss of right eye field which lasted for several minutes which occurred day prior to admission. The problem came on insidiously. Resolved on its own. Nothing she is aware of makes it better or worse. Patient presented to her ophthalmologist recommended coming to the ED for stroke workup.  ED Course: neurology was consultedaand CT scan of head was obtained which reported no acute intracranial abnormality. Was subsequently consulted at the request of neurology for stroke/TIA workup  Review of Systems: As per HPI otherwise 10 point review of systems negative.   Past Medical History  Diagnosis Date  . History of breast cancer     left  . Hypertension   . Hyperlipidemia   . GERD (gastroesophageal reflux disease)   . Thyroid disease     hypothyroidism  . Hernia     Past Surgical History  Procedure Laterality Date  . Breast lumpectomy  2012    left     reports that she has never smoked. She does not have any smokeless tobacco history on file. She reports that she drinks alcohol. She reports that she does not use illicit drugs.  Allergies  Allergen Reactions  . Codeine     Family History  Problem Relation Age of Onset  . Cancer Mother     breast     Prior to Admission medications   Medication Sig Start Date End Date Taking? Authorizing Provider  amLODipine (NORVASC) 5 MG tablet Take 5 mg by mouth daily.     Yes Historical Provider, MD  aspirin 81 MG tablet Take 81 mg by mouth daily.     Yes Historical Provider, MD  atorvastatin (LIPITOR) 20 MG tablet Take 20 mg by mouth daily.     Yes Historical Provider, MD  bimatoprost (LUMIGAN) 0.01 % SOLN  1 drop at bedtime.   Yes Historical Provider, MD  calcium carbonate (OS-CAL) 600 MG TABS Take 600 mg by mouth 2 (two) times daily with a meal.     Yes Historical Provider, MD  dorzolamide (TRUSOPT) 2 % ophthalmic solution 1 drop 2 (two) times daily.     Yes Historical Provider, MD  dorzolamide-timolol (COSOPT) 22.3-6.8 MG/ML ophthalmic solution  12/13/11  Yes Historical Provider, MD  esomeprazole (NEXIUM) 40 MG capsule Take 40 mg by mouth daily before breakfast.     Yes Historical Provider, MD  fish oil-omega-3 fatty acids 1000 MG capsule Take 2 g by mouth daily.     Yes Historical Provider, MD  levothyroxine (SYNTHROID, LEVOTHROID) 75 MCG tablet Take 75 mcg by mouth daily.     Yes Historical Provider, MD  Multiple Vitamins-Minerals (MULTIVITAMIN WITH MINERALS) tablet Take 1 tablet by mouth daily.     Yes Historical Provider, MD  letrozole (FEMARA) 2.5 MG tablet Take 1 tablet (2.5 mg total) by mouth daily. 08/03/13   Chauncey Cruel, MD    Physical Exam: Filed Vitals:   09/20/15 1420 09/20/15 1638 09/20/15 1713  BP: 137/69 121/66 172/85  Pulse: 81 102 87  Temp: 97.9 F (36.6 C)    TempSrc: Oral    Resp: '18 18 14  '$ Weight: 46.72 kg (103 lb)    SpO2: 99% 95% 100%  Constitutional: NAD, calm, comfortable Filed Vitals:   09/20/15 1420 09/20/15 1638 09/20/15 1713  BP: 137/69 121/66 172/85  Pulse: 81 102 87  Temp: 97.9 F (36.6 C)    TempSrc: Oral    Resp: '18 18 14  '$ Weight: 46.72 kg (103 lb)    SpO2: 99% 95% 100%   Eyes: PERRL, lids and conjunctivae normal ENMT: Mucous membranes are moist. Posterior pharynx clear of any exudate or lesions. Neck: normal, supple, no masses, no thyromegaly Respiratory: clear to auscultation bilaterally, no wheezing, no crackles. Normal respiratory effort. No accessory muscle use.  Cardiovascular: Regular rate and rhythm, no murmurs / rubs / gallops. No extremity edema. 2+ pedal pulses.   Abdomen: no tenderness, no masses palpated. No  hepatosplenomegaly. Bowel sounds positive.  Musculoskeletal: no clubbing / cyanosis. No joint deformity upper and lower extremities. Good ROM, no contractures. equal muscle tone.  Skin: no rashes, lesions, ulcers. No induration Neurologic: strength equal bilaterally in upper and lower extremities, answers questions appropriately, sensation to light touch intact Psychiatric: Normal judgment and insight. Alert and oriented x 3. Normal mood.   Labs on Admission: I have personally reviewed following labs and imaging studies  CBC:  Recent Labs Lab 09/20/15 1437 09/20/15 1447  WBC 6.1  --   NEUTROABS 2.7  --   HGB 11.4* 11.6*  HCT 34.0* 34.0*  MCV 103.0*  --   PLT 302  --    Basic Metabolic Panel:  Recent Labs Lab 09/20/15 1437 09/20/15 1447  NA 140 143  K 4.1 4.2  CL 106 104  CO2 27  --   GLUCOSE 101* 98  BUN 11 13  CREATININE 0.80 0.70  CALCIUM 9.5  --    GFR: CrCl cannot be calculated (Unknown ideal weight.). Liver Function Tests:  Recent Labs Lab 09/20/15 1437  AST 27  ALT 22  ALKPHOS 47  BILITOT 1.2  PROT 6.7  ALBUMIN 3.9   No results for input(s): LIPASE, AMYLASE in the last 168 hours. No results for input(s): AMMONIA in the last 168 hours. Coagulation Profile:  Recent Labs Lab 09/20/15 1437  INR 1.07   Cardiac Enzymes: No results for input(s): CKTOTAL, CKMB, CKMBINDEX, TROPONINI in the last 168 hours. BNP (last 3 results) No results for input(s): PROBNP in the last 8760 hours. HbA1C: No results for input(s): HGBA1C in the last 72 hours. CBG: No results for input(s): GLUCAP in the last 168 hours. Lipid Profile: No results for input(s): CHOL, HDL, LDLCALC, TRIG, CHOLHDL, LDLDIRECT in the last 72 hours. Thyroid Function Tests: No results for input(s): TSH, T4TOTAL, FREET4, T3FREE, THYROIDAB in the last 72 hours. Anemia Panel: No results for input(s): VITAMINB12, FOLATE, FERRITIN, TIBC, IRON, RETICCTPCT in the last 72 hours. Urine analysis:      Component Value Date/Time   COLORURINE YELLOW 05/09/2010 [place] 05/09/2010 1205   LABSPEC 1.007 05/09/2010 1205   PHURINE 7.5 05/09/2010 1205   HGBUR NEGATIVE 05/09/2010 1205   [place] 05/09/2010 1205   [place] 05/09/2010 1205   PROTEINUR NEGATIVE 05/09/2010 1205   UROBILINOGEN 0.2 05/09/2010 1205   NITRITE NEGATIVE 05/09/2010 1205   LEUKOCYTESUR  05/09/2010 1205    NEGATIVE MICROSCOPIC NOT DONE ON URINES WITH NEGATIVE PROTEIN, BLOOD, LEUKOCYTES, NITRITE, OR GLUCOSE <1000 mg/dL.   Sepsis Labs: !!!!!!!!!!!!!!!!!!!!!!!!!!!!!!!!!!!!!!!!!!!! '@LABRCNTIP'$ (procalcitonin:4,lacticidven:4) )No results found for this or any previous visit (from the past 240 hour(s)).   Radiological Exams on Admission: Ct Head Wo Contrast  09/20/2015  CLINICAL DATA:  81 year old  with amaurosis fugax involving the right eye on Saturday with loss of vision for approximately 5 min. EXAM: CT HEAD WITHOUT CONTRAST TECHNIQUE: Contiguous axial images were obtained from the base of the skull through the vertex without intravenous contrast. COMPARISON:  None. FINDINGS: Moderate to severe cortical and deep atrophy related to patient age. Mild cerebellar atrophy. Moderate to severe changes of small vessel disease of the white matter diffusely, including the midbrain and pons. Physiologic calcification in the right basal ganglia. No mass lesion. No midline shift. No acute hemorrhage or hematoma. No extra-axial fluid collections. No evidence of acute infarction. No skull fracture or other focal osseous abnormality involving the skull. Mucosal thickening involving the sphenoid sinuses and scattered bilateral ethmoid air cells. Remaining visualized paranasal sinuses, bilateral mastoid air cells and bilateral middle ear cavities well-aerated. Extensive bilateral carotid siphon atherosclerosis. IMPRESSION: 1. No acute intracranial abnormality. 2. Moderate to severe generalized atrophy and chronic  microvascular ischemic changes of the white matter. 3. Mild chronic bilateral ethmoid and sphenoid sinus disease. Electronically Signed   By: Evangeline Dakin M.D.   On: 09/20/2015 15:52    EKG: Independently reviewed. Normal sinus rhythm with no ST elevations or depressions  Assessment/Plan Active Problems:   TIA (transient ischemic attack)/Stroke-like symptom - Neurology consulted - ESR and CRP at request of ophthalmologist ordered and pending (no reports of headache at any time) - Patient to undergo routine work up for TIA    DVT prophylaxis: heparin Code Status: full Family Communication: d/c pt directly Disposition Plan: Pending work up [employer] called: Neurology by ED Admission status:  obs   Velvet Bathe MD [facility] Pager [phone]  If 7PM-7AM, please contact night-coverage [email or web address] Password [facility]  09/20/2015, 6:24 PM

## 2015-09-20 NOTE — Progress Notes (Signed)
Patient arrived from ED to 5M07 at 1845. Patient alert and oriented X4 with no c/o pain. Vital signs taken and charted. Telemetry applied and CCMD notified. Oriented to room with bed alarm on . Diet ordered.

## 2015-09-20 NOTE — ED Provider Notes (Signed)
CSN: [id number]     Arrival date & time 09/20/15  1403 History   First MD Initiated Contact with Patient 09/20/15 1703     Chief Complaint  Patient presents with  . Neurologic Problem     (Consider location/radiation/quality/duration/timing/severity/associated sxs/prior Treatment) Patient is a 82 y.o. female presenting with neurologic complaint. The history is provided by the patient (The patient states that on Saturday her right eye went black and she cannot see out of for about 2 minutes. She is fine now. She was seen by ophthalmology who sent her to the hospital for admission).  Neurologic Problem This is a new problem. The current episode started less than 1 hour ago. The problem occurs rarely. The problem has been resolved. Pertinent negatives include no chest pain, no abdominal pain and no headaches. Nothing aggravates the symptoms. Nothing relieves the symptoms.    Past Medical History  Diagnosis Date  . History of breast cancer     left  . Hypertension   . Hyperlipidemia   . GERD (gastroesophageal reflux disease)   . Thyroid disease     hypothyroidism  . Hernia    Past Surgical History  Procedure Laterality Date  . Breast lumpectomy  2012    left   Family History  Problem Relation Age of Onset  . Cancer Mother     breast   Social History  Substance Use Topics  . Smoking status: Never Smoker   . Smokeless tobacco: None  . Alcohol Use: Yes   OB History    No data available     Review of Systems  Constitutional: Negative for appetite change and fatigue.  HENT: Negative for congestion, ear discharge and sinus pressure.        Visit change in right eye that has resolved  Eyes: Negative for discharge.  Respiratory: Negative for cough.   Cardiovascular: Negative for chest pain.  Gastrointestinal: Negative for abdominal pain and diarrhea.  Genitourinary: Negative for frequency and hematuria.  Musculoskeletal: Negative for back pain.  Skin: Negative for rash.   Neurological: Negative for seizures and headaches.  Psychiatric/Behavioral: Negative for hallucinations.      Allergies  Codeine  Home Medications   Prior to Admission medications   Medication Sig Start Date End Date Taking? Authorizing Provider  amLODipine (NORVASC) 5 MG tablet Take 5 mg by mouth daily.      Historical Provider, MD  aspirin 81 MG tablet Take 81 mg by mouth daily.      Historical Provider, MD  atorvastatin (LIPITOR) 20 MG tablet Take 20 mg by mouth daily.      Historical Provider, MD  bimatoprost (LUMIGAN) 0.01 % SOLN 1 drop at bedtime.    Historical Provider, MD  calcium carbonate (OS-CAL) 600 MG TABS Take 600 mg by mouth 2 (two) times daily with a meal.      Historical Provider, MD  dorzolamide (TRUSOPT) 2 % ophthalmic solution 1 drop 2 (two) times daily.      Historical Provider, MD  dorzolamide-timolol (COSOPT) 22.3-6.8 MG/ML ophthalmic solution  12/13/11   Historical Provider, MD  esomeprazole (NEXIUM) 40 MG capsule Take 40 mg by mouth daily before breakfast.      Historical Provider, MD  fish oil-omega-3 fatty acids 1000 MG capsule Take 2 g by mouth daily.      Historical Provider, MD  letrozole (FEMARA) 2.5 MG tablet Take 1 tablet (2.5 mg total) by mouth daily. 08/03/13   Chauncey Cruel, MD  levothyroxine ([place], [place]) 2  MCG tablet Take 75 mcg by mouth daily.      Historical Provider, MD  Multiple Vitamins-Minerals (MULTIVITAMIN WITH MINERALS) tablet Take 1 tablet by mouth daily.      Historical Provider, MD   BP 172/85 mmHg  Pulse 87  Temp(Src) 97.9 F (36.6 C) (Oral)  Resp 14  Wt 103 lb (46.72 kg)  SpO2 100% Physical Exam  Constitutional: She is oriented to person, place, and time. She appears well-developed.  HENT:  Head: Normocephalic.  Eyes: Conjunctivae and EOM are normal. No scleral icterus.  Neck: Neck supple. No thyromegaly present.  Cardiovascular: Normal rate and regular rhythm.  Exam reveals no gallop and no friction rub.   No  murmur heard. Pulmonary/Chest: No stridor. She has no wheezes. She has no rales. She exhibits no tenderness.  Abdominal: She exhibits no distension. There is no tenderness. There is no rebound.  Musculoskeletal: Normal range of motion. She exhibits no edema.  Lymphadenopathy:    She has no cervical adenopathy.  Neurological: She is oriented to person, place, and time. She exhibits normal muscle tone. Coordination normal.  Skin: No rash noted. No erythema.  Psychiatric: She has a normal mood and affect. Her behavior is normal.    ED Course  Procedures (including critical care time) Labs Review Labs Reviewed  CBC - Abnormal; Notable for the following:    RBC 3.30 (*)    Hemoglobin 11.4 (*)    HCT 34.0 (*)    MCV 103.0 (*)    MCH 34.5 (*)    All other components within normal limits  COMPREHENSIVE METABOLIC PANEL - Abnormal; Notable for the following:    Glucose, Bld 101 (*)    All other components within normal limits  I-STAT CHEM 8, ED - Abnormal; Notable for the following:    Hemoglobin 11.6 (*)    HCT 34.0 (*)    All other components within normal limits  PROTIME-INR  APTT  DIFFERENTIAL  SEDIMENTATION RATE  C-REACTIVE PROTEIN  I-STAT TROPOININ, ED    Imaging Review Ct Head Wo Contrast  09/20/2015  CLINICAL DATA:  82 year old with amaurosis fugax involving the right eye on Saturday with loss of vision for approximately 5 min. EXAM: CT HEAD WITHOUT CONTRAST TECHNIQUE: Contiguous axial images were obtained from the base of the skull through the vertex without intravenous contrast. COMPARISON:  None. FINDINGS: Moderate to severe cortical and deep atrophy related to patient age. Mild cerebellar atrophy. Moderate to severe changes of small vessel disease of the white matter diffusely, including the midbrain and pons. Physiologic calcification in the right basal ganglia. No mass lesion. No midline shift. No acute hemorrhage or hematoma. No extra-axial fluid collections. No evidence  of acute infarction. No skull fracture or other focal osseous abnormality involving the skull. Mucosal thickening involving the sphenoid sinuses and scattered bilateral ethmoid air cells. Remaining visualized paranasal sinuses, bilateral mastoid air cells and bilateral middle ear cavities well-aerated. Extensive bilateral carotid siphon atherosclerosis. IMPRESSION: 1. No acute intracranial abnormality. 2. Moderate to severe generalized atrophy and chronic microvascular ischemic changes of the white matter. 3. Mild chronic bilateral ethmoid and sphenoid sinus disease. Electronically Signed   By: Evangeline Dakin M.D.   On: 09/20/2015 15:52   I have personally reviewed and evaluated these images and lab results as part of my medical decision-making.   EKG Interpretation   Date/Time:  Tuesday Sep 20 2015 14:16:49 EDT Ventricular Rate:  74 PR Interval:  170 QRS Duration: 62 QT Interval:  360 QTC  Calculation: 399 R Axis:   87 Text Interpretation:  Normal sinus rhythm Septal infarct , age  undetermined Abnormal ECG Confirmed by Abdirahman Chittum  MD, Krystyne Tewks[place] [phone]) on  09/20/2015 5:48:36 PM      MDM   Final diagnoses:  Transient cerebral ischemia, unspecified transient cerebral ischemia type    Transient loss of vision in right eye. I called neurology who felt like patient should be admitted for TIA workup. [facility]'ll be admitted to [facility]    Milton Ferguson, MD 09/20/15 [phone]

## 2015-09-20 NOTE — ED Notes (Signed)
Brought patient to room, patient undressed, in gown, on monitor, continuous pulse oximetry and blood pressure cuff

## 2015-09-20 NOTE — Progress Notes (Signed)
Patient to MRI via wheelchair at 2120.

## 2015-09-20 NOTE — ED Notes (Signed)
Pt sent to ED by eye MD for further eval, pt reports over the weekend having R eye vision changes that the pt describes as "a darkness lasting a minute or two." denies all other changes at that time, no facial droop, slurred speech or weakness noted at that time, pt takes ASA daily, A& O x4

## 2015-09-21 ENCOUNTER — Observation Stay (HOSPITAL_BASED_OUTPATIENT_CLINIC_OR_DEPARTMENT_OTHER): Payer: Medicare Other

## 2015-09-21 ENCOUNTER — Observation Stay (HOSPITAL_COMMUNITY): Payer: Medicare Other

## 2015-09-21 ENCOUNTER — Encounter (HOSPITAL_COMMUNITY): Payer: Self-pay | Admitting: Radiology

## 2015-09-21 DIAGNOSIS — E785 Hyperlipidemia, unspecified: Secondary | ICD-10-CM | POA: Diagnosis not present

## 2015-09-21 DIAGNOSIS — I359 Nonrheumatic aortic valve disorder, unspecified: Secondary | ICD-10-CM | POA: Diagnosis not present

## 2015-09-21 DIAGNOSIS — R2990 Unspecified symptoms and signs involving the nervous system: Secondary | ICD-10-CM

## 2015-09-21 DIAGNOSIS — Z01810 Encounter for preprocedural cardiovascular examination: Secondary | ICD-10-CM

## 2015-09-21 DIAGNOSIS — I10 Essential (primary) hypertension: Secondary | ICD-10-CM | POA: Diagnosis not present

## 2015-09-21 DIAGNOSIS — I6529 Occlusion and stenosis of unspecified carotid artery: Secondary | ICD-10-CM | POA: Insufficient documentation

## 2015-09-21 DIAGNOSIS — I358 Other nonrheumatic aortic valve disorders: Secondary | ICD-10-CM | POA: Diagnosis not present

## 2015-09-21 DIAGNOSIS — G459 Transient cerebral ischemic attack, unspecified: Secondary | ICD-10-CM | POA: Diagnosis not present

## 2015-09-21 DIAGNOSIS — G453 Amaurosis fugax: Secondary | ICD-10-CM | POA: Diagnosis not present

## 2015-09-21 DIAGNOSIS — I6521 Occlusion and stenosis of right carotid artery: Secondary | ICD-10-CM | POA: Diagnosis not present

## 2015-09-21 DIAGNOSIS — K219 Gastro-esophageal reflux disease without esophagitis: Secondary | ICD-10-CM | POA: Insufficient documentation

## 2015-09-21 LAB — LIPID PANEL
CHOL/HDL RATIO: 2.3 ratio
Cholesterol: 136 mg/dL (ref 0–200)
HDL: 60 mg/dL (ref >40)
LDL CALC: 64 mg/dL (ref 0–99)
Triglycerides: 59 mg/dL (ref <150)
VLDL: 12 mg/dL (ref 0–40)

## 2015-09-21 LAB — ECHOCARDIOGRAM COMPLETE
HEIGHTINCHES: 59 in
WEIGHTICAEL: 1648 [oz_av]

## 2015-09-21 MED ORDER — ACETAMINOPHEN 325 MG PO TABS: 650.0000 mg | ORAL_TABLET | ORAL | Status: DC | PRN

## 2015-09-21 MED ORDER — IOPAMIDOL (ISOVUE-370) INJECTION 76%
INTRAVENOUS | Status: AC
Start: 2015-09-21 — End: 2015-09-21
  Administered 2015-09-21: 50 mL
  Filled 2015-09-21: qty 50

## 2015-09-21 MED ORDER — ACETAMINOPHEN 325 MG PO TABS
650.0000 mg | ORAL_TABLET | Freq: Four times a day (QID) | ORAL | Status: DC | PRN
Start: 2015-09-21 — End: 2015-09-23
  Administered 2015-09-21: 650 mg via ORAL
  Filled 2015-09-21: qty 2

## 2015-09-21 NOTE — Consult Note (Signed)
Vascular Surgery Consultation  Reason for Consult: Transient blindness right eye due to to right carotid occlusive disease  HPI: Tabitha Harris is a 82 y.o. female who presents for evaluation of Transient blindness right eye. Patient stated she had an episode of total blindness in the right eye lasting 3 minutes.  Since admission she has been found to have severe right internal carotid artery stenosis. She has no previous history of TIA or stroke. She is quite active playing tennis and driving her automobile and living independently at age 61. She has no history of coronary artery disease.   Past Medical History  Diagnosis Date  . History of breast cancer     left  . Hypertension   . Hyperlipidemia   . GERD (gastroesophageal reflux disease)   . Thyroid disease     hypothyroidism  . Hernia    Past Surgical History  Procedure Laterality Date  . Breast lumpectomy  2012    left   Social History   Social History  . Marital Status: Widowed    Spouse Name: N/A  . Number of Children: N/A  . Years of Education: N/A   Social History Main Topics  . Smoking status: Never Smoker   . Smokeless tobacco: None  . Alcohol Use: Yes  . Drug Use: No  . Sexual Activity: Not Asked   Other Topics Concern  . None   Social History Narrative   Family History  Problem Relation Age of Onset  . Cancer Mother     breast   Allergies  Allergen Reactions  . Codeine    Prior to Admission medications   Medication Sig Start Date End Date Taking? Authorizing Provider  amLODipine (NORVASC) 5 MG tablet Take 5 mg by mouth daily.     Yes Historical Provider, MD  aspirin 81 MG tablet Take 81 mg by mouth daily.     Yes Historical Provider, MD  atorvastatin (LIPITOR) 20 MG tablet Take 20 mg by mouth daily.     Yes Historical Provider, MD  bimatoprost (LUMIGAN) 0.01 % SOLN 1 drop at bedtime.   Yes Historical Provider, MD  calcium carbonate (OS-CAL) 600 MG TABS Take 600 mg by mouth 2 (two) times daily  with a meal.     Yes Historical Provider, MD  dorzolamide (TRUSOPT) 2 % ophthalmic solution 1 drop 2 (two) times daily.     Yes Historical Provider, MD  dorzolamide-timolol (COSOPT) 22.3-6.8 MG/ML ophthalmic solution  12/13/11  Yes Historical Provider, MD  esomeprazole (NEXIUM) 40 MG capsule Take 40 mg by mouth daily before breakfast.     Yes Historical Provider, MD  fish oil-omega-3 fatty acids 1000 MG capsule Take 2 g by mouth daily.     Yes Historical Provider, MD  levothyroxine (SYNTHROID, LEVOTHROID) 75 MCG tablet Take 75 mcg by mouth daily.     Yes Historical Provider, MD  Multiple Vitamins-Minerals (MULTIVITAMIN WITH MINERALS) tablet Take 1 tablet by mouth daily.     Yes Historical Provider, MD  letrozole (FEMARA) 2.5 MG tablet Take 1 tablet (2.5 mg total) by mouth daily. 08/03/13   Chauncey Cruel, MD     Positive ROS: denies chest pain, dyspnea on exertion, PND, orthopnea, hemoptysis, claudication  All other systems have been reviewed and were otherwise negative with the exception of those mentioned in the HPI and as above.  Physical Exam: Filed Vitals:   09/21/15 0940 09/21/15 1342  BP: 160/70 151/44  Pulse: 81   Temp: 98.2 F (36.8  C) 98.6 F (37 C)  Resp: 18 18    General: Alert, no acute distress HEENT: Normal for age Cardiovascular: Regular rate and rhythm. Carotid pulses 2+, no bruits audible Respiratory: Clear to auscultation. No cyanosis, no use of accessory musculature GI: No organomegaly, abdomen is soft and non-tender Skin: No lesions in the area of chief complaint Neurologic: Sensation intact distally Psychiatric: Patient is competent for consent with normal mood and affect Musculoskeletal: No obvious deformities Extremities: 3+ femoral and dorsalis pedis pulses palpable bilaterally.    Imaging reviewed: Carotid duplex exam reviewed and patient has 80-90% right ICA stenosis with mild left ICA stenosis  MRI revealed no evidence of acute infarct     Assessment/Plan:  #1 would recommend right carotid endarterectomy in this very healthy and active patient who is quite independent #2 will get cardiology consult for preoperative clearance #3 patient will contact her family to determine if she can arrange a family member to stay with her following discharge Possible right carotid endarterectomy on Friday depending on patient's desires and  Other input   Tinnie Gens, MD 09/21/2015 4:14 PM

## 2015-09-21 NOTE — Evaluation (Signed)
Physical Therapy Evaluation Patient Details Name: Tabitha Harris MRN: [id number] DOB: 01/24/1920 Today's Date: 09/21/2015   History of Present Illness  Patient is a 82 y/o female with hx of breast ca, HTN, HLD presents with transient vision loss of right eye, which resolved after a few minutes. MRI and CT unremarkable. Workup pending.  Clinical Impression  Patient tolerated ambulation with higher level balance challenges with only mild deviations in gait but no overt LOB. Able to perform stair training with supervision for safety. Pt is very active and independent PTA playing tennis a few days/week. Pt reports all symptoms have resolved. Pt does not require further skilled therapy services as pt functioning close to baseline. Discharge from therapy.    Follow Up Recommendations No PT follow up    Equipment Recommendations  None recommended by PT    Recommendations for Other Services       Precautions / Restrictions Precautions Precautions: Fall Restrictions Weight Bearing Restrictions: No      Mobility  Bed Mobility               General bed mobility comments: Up in chair upon PT arrival.   Transfers Overall transfer level: Needs assistance Equipment used: None Transfers: Sit to/from Stand Sit to Stand: Modified independent (Device/Increase time)         General transfer comment: Stood from chair without difficulty.   Ambulation/Gait Ambulation/Gait assistance: Modified independent (Device/Increase time);Supervision Ambulation Distance (Feet): 300 Feet Assistive device: None Gait Pattern/deviations: Step-through pattern;Decreased stride length;Drifts right/left     General Gait Details: mostly steady gait. Performed higher level balance - see balance section for DGI. Some drifting noted but no overt LOB.  Stairs Stairs: Yes Stairs assistance: Supervision Stair Management: One rail Right;Alternating pattern Number of Stairs: 13 General stair comments:  Cues for safety/technique. DOE. HR up to 125 bpm.  Wheelchair Mobility    Modified Rankin (Stroke Patients Only) Modified Rankin (Stroke Patients Only) Pre-Morbid Rankin Score: Slight disability Modified Rankin: Slight disability     Balance Overall balance assessment: Needs assistance Sitting-balance support: Feet supported;No upper extremity supported Sitting balance-Leahy Scale: Good     Standing balance support: During functional activity Standing balance-Leahy Scale: Good               High level balance activites: Direction changes;Turns;Sudden stops;Head turns High Level Balance Comments: tolerated above with only mild deviations in gait path but no overt LOB. Drifting noted with head turns.  Standardized Balance Assessment Standardized Balance Assessment : Dynamic Gait Index   Dynamic Gait Index Level Surface: Normal Change in Gait Speed: Normal Gait with Horizontal Head Turns: Mild Impairment Gait with Vertical Head Turns: Mild Impairment Gait and Pivot Turn: Mild Impairment Step Over Obstacle: Normal Step Around Obstacles: Normal Steps: Mild Impairment Total Score: 20       Pertinent Vitals/Pain Pain Assessment: No/denies pain    Home Living Family/patient expects to be discharged to:: Private residence Living Arrangements: Alone   Type of Home: Other(Comment) (town home) Home Access: Level entry     Home Layout: Two level;Bed/bath upstairs        Prior Function Level of Independence: Independent         Comments: Drives, cooks, plays tennis     Journalist, newspaper        Extremity/Trunk Assessment   Upper Extremity Assessment: Defer to OT evaluation           Lower Extremity Assessment: Overall WFL for tasks assessed  Cervical / Trunk Assessment: Other exceptions  Communication   Communication: No difficulties  Cognition Arousal/Alertness: Awake/alert Behavior During Therapy: WFL for tasks assessed/performed Overall  Cognitive Status: Within Functional Limits for tasks assessed                      General Comments      Exercises        Assessment/Plan    PT Assessment Patent does not need any further PT services  PT Diagnosis Difficulty walking   PT Problem List    PT Treatment Interventions     PT Goals (Current goals can be found in the Care Plan section) Acute Rehab PT Goals Patient Stated Goal: to get out of here PT Goal Formulation: All assessment and education complete, DC therapy Time For Goal Achievement: 10/05/15 Potential to Achieve Goals: Good    Frequency     Barriers to discharge        Co-evaluation               End of Session Equipment Utilized During Treatment: Gait belt Activity Tolerance: Patient tolerated treatment well Patient left: in chair;with call bell/phone within reach;with chair alarm set Nurse Communication: Mobility status    Functional Assessment Tool Used: clinical judgement Functional Limitation: Mobility: Walking and moving around Mobility: Walking and Moving Around Current Status [phone]): At least 1 percent but less than 20 percent impaired, limited or restricted Mobility: Walking and Moving Around Goal Status [phone]): At least 1 percent but less than 20 percent impaired, limited or restricted Mobility: Walking and Moving Around Discharge Status [phone]): At least 1 percent but less than 20 percent impaired, limited or restricted    Time: 1035-1050 PT Time Calculation (min) (ACUTE ONLY): 15 min   Charges:   PT Evaluation $PT Eval Moderate Complexity: 1 Procedure     PT G Codes:   PT G-Codes **NOT FOR INPATIENT CLASS** Functional Assessment Tool Used: clinical judgement Functional Limitation: Mobility: Walking and moving around Mobility: Walking and Moving Around Current Status [id number]): At least 1 percent but less than 20 percent impaired, limited or restricted Mobility: Walking and Moving Around Goal Status [phone]): At  least 1 percent but less than 20 percent impaired, limited or restricted Mobility: Walking and Moving Around Discharge Status [phone]): At least 1 percent but less than 20 percent impaired, limited or restricted    [place] 09/21/2015, [place], [place], DPT [phone]

## 2015-09-21 NOTE — Progress Notes (Signed)
STROKE TEAM PROGRESS NOTE   HISTORY OF PRESENT ILLNESS (per record) Tabitha Harris is a 82 y.o. female he was in her normal state of health earlier when she had transient right eye visual loss. She states that she was reading something when suddenly the right eye went completely out. She states that it was both sides of the field in the right eye, not the right visual field. She then fell or she saw something kind of move across her vision and then the vision fairly quickly came back. The left so lasted 2-3 minutes. She is completely back to baseline. She has never had anything like this happen before. She was LKW 09/20/2015 in the morning, unclear time. Patient was not administered IV t-PA secondary to resolved symptoms. She was admitted for further evaluation and treatment.   SUBJECTIVE (INTERVAL HISTORY) Patient has no complaints of deficits today. Her vision is back to her baseline. She states she has history of right carotid stenosis which has been followed with serial ultrasounds. She denies any prior history of strokes or TIA.She is quite active and independent in activities of daily living and living alone.  OBJECTIVE Temp:  [97.7 F (36.5 C)-99 F (37.2 C)] 99 F (37.2 C) (05/31 0500) Pulse Rate:  [68-102] 68 (05/31 0700) Cardiac Rhythm:  [-] Normal sinus rhythm (05/31 0700) Resp:  [14-48] 18 (05/31 0700) BP: (117-172)/(50-91) 139/53 mmHg (05/31 0700) SpO2:  [95 %-100 %] 98 % (05/31 0700) Weight:  [46.72 kg (103 lb)] 46.72 kg (103 lb) (05/30 1944)  CBC:  Recent Labs Lab 09/20/15 1437 09/20/15 1447  WBC 6.1  --   NEUTROABS 2.7  --   HGB 11.4* 11.6*  HCT 34.0* 34.0*  MCV 103.0*  --   PLT 302  --    Basic Metabolic Panel:  Recent Labs Lab 09/20/15 1437 09/20/15 1447  NA 140 143  K 4.1 4.2  CL 106 104  CO2 27  --   GLUCOSE 101* 98  BUN 11 13  CREATININE 0.80 0.70  CALCIUM 9.5  --     Lipid Panel:    Component Value Date/Time   CHOL 136 09/21/2015 0519   TRIG 59  09/21/2015 0519   HDL 60 09/21/2015 0519   CHOLHDL 2.3 09/21/2015 0519   VLDL 12 09/21/2015 0519   LDLCALC 64 09/21/2015 0519   HgbA1c: No results found for: HGBA1C Urine Drug Screen: No results found for: LABOPIA, COCAINSCRNUR, LABBENZ, AMPHETMU, THCU, LABBARB   IMAGING  Ct Head Wo Contrast  09/20/2015  CLINICAL DATA:  82 year old with amaurosis fugax involving the right eye on Saturday with loss of vision for approximately 5 min. EXAM: CT HEAD WITHOUT CONTRAST TECHNIQUE: Contiguous axial images were obtained from the base of the skull through the vertex without intravenous contrast. COMPARISON:  None. FINDINGS: Moderate to severe cortical and deep atrophy related to patient age. Mild cerebellar atrophy. Moderate to severe changes of small vessel disease of the white matter diffusely, including the midbrain and pons. Physiologic calcification in the right basal ganglia. No mass lesion. No midline shift. No acute hemorrhage or hematoma. No extra-axial fluid collections. No evidence of acute infarction. No skull fracture or other focal osseous abnormality involving the skull. Mucosal thickening involving the sphenoid sinuses and scattered bilateral ethmoid air cells. Remaining visualized paranasal sinuses, bilateral mastoid air cells and bilateral middle ear cavities well-aerated. Extensive bilateral carotid siphon atherosclerosis. IMPRESSION: 1. No acute intracranial abnormality. 2. Moderate to severe generalized atrophy and chronic microvascular ischemic changes  of the white matter. 3. Mild chronic bilateral ethmoid and sphenoid sinus disease. Electronically Signed   By: Evangeline Dakin M.D.   On: 09/20/2015 15:52   Mr Brain Wo Contrast  09/21/2015  CLINICAL DATA:  Initial evaluation for acute transient right-sided visual loss. EXAM: MRI HEAD WITHOUT CONTRAST MRA HEAD WITHOUT CONTRAST TECHNIQUE: Multiplanar, multiecho pulse sequences of the brain and surrounding structures were obtained without  intravenous contrast. Angiographic images of the head were obtained using MRA technique without contrast. COMPARISON:  Prior CT from earlier the same day FINDINGS: MRI HEAD FINDINGS Diffuse prominence of the CSF containing spaces is compatible with generalized age-related cerebral atrophy. Patchy T2/FLAIR hyperintensity within the periventricular, deep, and subcortical white matter both cerebral hemispheres most consistent with chronic small vessel ischemic disease, mild for age. Remote lacunar infarct present within the ventral right thalamus. No remote cortical infarct. No abnormal foci of restricted diffusion to suggest acute infarct. Gray-white matter differentiation maintained. Major intracranial vascular flow voids are preserved. No acute or chronic intracranial hemorrhage. No mass lesion, midline shift, or mass effect. Ventricular prominence related to global parenchymal volume loss without hydrocephalus. No extra-axial fluid collection. Major dural sinuses are grossly patent. Craniocervical junction within normal limits. Degenerative disc bulge noted at C3-4 with resultant mild canal stenosis. Pituitary gland within normal limits. No acute abnormality about the orbits. Patient is status post bilateral cataract extraction. Scattered mucosal thickening within the paranasal sinuses, likely inflammatory. No air-fluid levels to suggest active sinus infection. No mastoid effusion. Inner ear structures grossly normal. Bone marrow signal intensity within normal limits. No scalp soft tissue abnormality. MRA HEAD FINDINGS ANTERIOR CIRCULATION: Distal cervical segments of the internal carotid arteries are widely patent. Petrous, cavernous, and supraclinoid segments patent without flow limiting stenosis A1 segments patent. Right A1 segment hypoplastic. Anterior communicating artery normal. Anterior cerebral arteries well opacified. M1 segments patent without stenosis or occlusion. MCA bifurcations normal. Distal MCA  branches well opacified and symmetric. POSTERIOR CIRCULATION: Vertebral arteries patent to the vertebrobasilar junction. Posterior inferior cerebral arteries patent bilaterally. Basilar artery widely patent. Superior cerebellar arteries patent bilaterally. Both of the posterior cerebral arteries arise from the basilar artery no well opacified to their distal aspects. IMPRESSION: MRI HEAD IMPRESSION: 1. No acute intracranial infarct or other process identified. 2. Remote lacunar infarct within the ventral right thalamus. 3. Generalized age-related cerebral atrophy with mild chronic microvascular ischemic disease. MRA HEAD IMPRESSION: Normal intracranial MRA. No large or proximal arterial branch occlusion. No high-grade or correctable stenosis. Electronically Signed   By: Jeannine Boga M.D.   On: 09/21/2015 00:07   Mr Jodene Nam Head/brain Wo Cm  09/21/2015  CLINICAL DATA:  Initial evaluation for acute transient right-sided visual loss. EXAM: MRI HEAD WITHOUT CONTRAST MRA HEAD WITHOUT CONTRAST TECHNIQUE: Multiplanar, multiecho pulse sequences of the brain and surrounding structures were obtained without intravenous contrast. Angiographic images of the head were obtained using MRA technique without contrast. COMPARISON:  Prior CT from earlier the same day FINDINGS: MRI HEAD FINDINGS Diffuse prominence of the CSF containing spaces is compatible with generalized age-related cerebral atrophy. Patchy T2/FLAIR hyperintensity within the periventricular, deep, and subcortical white matter both cerebral hemispheres most consistent with chronic small vessel ischemic disease, mild for age. Remote lacunar infarct present within the ventral right thalamus. No remote cortical infarct. No abnormal foci of restricted diffusion to suggest acute infarct. Gray-white matter differentiation maintained. Major intracranial vascular flow voids are preserved. No acute or chronic intracranial hemorrhage. No mass lesion, midline shift, or  mass effect. Ventricular prominence related to global parenchymal volume loss without hydrocephalus. No extra-axial fluid collection. Major dural sinuses are grossly patent. Craniocervical junction within normal limits. Degenerative disc bulge noted at C3-4 with resultant mild canal stenosis. Pituitary gland within normal limits. No acute abnormality about the orbits. Patient is status post bilateral cataract extraction. Scattered mucosal thickening within the paranasal sinuses, likely inflammatory. No air-fluid levels to suggest active sinus infection. No mastoid effusion. Inner ear structures grossly normal. Bone marrow signal intensity within normal limits. No scalp soft tissue abnormality. MRA HEAD FINDINGS ANTERIOR CIRCULATION: Distal cervical segments of the internal carotid arteries are widely patent. Petrous, cavernous, and supraclinoid segments patent without flow limiting stenosis A1 segments patent. Right A1 segment hypoplastic. Anterior communicating artery normal. Anterior cerebral arteries well opacified. M1 segments patent without stenosis or occlusion. MCA bifurcations normal. Distal MCA branches well opacified and symmetric. POSTERIOR CIRCULATION: Vertebral arteries patent to the vertebrobasilar junction. Posterior inferior cerebral arteries patent bilaterally. Basilar artery widely patent. Superior cerebellar arteries patent bilaterally. Both of the posterior cerebral arteries arise from the basilar artery no well opacified to their distal aspects. IMPRESSION: MRI HEAD IMPRESSION: 1. No acute intracranial infarct or other process identified. 2. Remote lacunar infarct within the ventral right thalamus. 3. Generalized age-related cerebral atrophy with mild chronic microvascular ischemic disease. MRA HEAD IMPRESSION: Normal intracranial MRA. No large or proximal arterial branch occlusion. No high-grade or correctable stenosis. Electronically Signed   By: Jeannine Boga M.D.   On: 09/21/2015  00:07   Carotid Doppler   Findings suggest 60-79% right internal carotid artery stenosis by elevated end diastolic velocities, however peak systolic velocities and ICA/CCA ratio suggest 80-99% right internal carotid artery stenosis. Right vertebral artery is patent with antegrade flow. Left internal carotid artery exhibits 40-59% stenosis by elevated velocities. Left vertebral artery is patent with retrograde flow.   PHYSICAL EXAM GENERAL- alert, co-operative, appears as stated age, not in any distress, sitting in recliner at bedside. HEENT- Atraumatic, normocephalic, appears to have a cardiac murmur transmitting to her carotids.  CARDIAC- RRR, 3/6 systolic murmurs, no rubs or gallops. RESP- Moving equal volumes of air, and no wheezes or crackles. ABDOMEN- Soft, non tender. NEURO- awakeAlert, oriented x 3, Visual fields intact, EOMI,fundi not visualized. Vision acuity seems adequate. Normal hearing and facial strength.  Motor system exam shows a normal  , strength in  upper and lower extremities- 5/5, finger to nose test normal bilat, rapid alternating movement- intact. EXTREMITIES- warm, moving extremities spontaneously.  ASSESSMENT/PLAN Ms. Tabitha Harris is a 82 y.o. female with history of HTN, HLD, L breast cancer, thyroid disease and GERD presenting with R eye vision loss thought to be amaurosis fugax in setting of negative ESR & CRP. She did not receive IV t-PA due to resolved symptoms.    amaurosis fugax secondary to right carotid artery stenosis  MRI  No acute stroke. Old R thalamic lacune. Atrophy. small vessel disease.  MRA  normal  Carotid Doppler  R 60-79% stenosis, L 40-59%. L VA flow retrograde  2D Echo  Pending.  LDL 64  VVS consulted  HgbA1c pending  Heparin 5000 units sq tid for VTE prophylaxis  Diet Heart Room service appropriate?: Yes; Fluid consistency:: Thin  No antithrombotic prior to admission, now on aspirin 81 mg daily  Patient counseled to be  compliant with her antithrombotic medications  Vascular surgical eval- pending recs.  Ongoing aggressive stroke risk factor management  Therapy recommendations:  No PT follow up  Disposition:  Home  Carotid Artery Stenosis  Aspirin, Statin, HTN control  Vascular Surg eval.  Hypertension  Stable  Hyperlipidemia  Home meds:  lipitor 20 and fish oil, statin resumed in hospital  LDL 64, goal < 70  Continue statin at discharge  Other Stroke Risk Factors  Advanced age  ETOH use  Other Active Problems  Thyroid disease on synthroid.  Hospital day # 1  Bing Neighbors, MD IMTS- resident. 09/21/2015 3:23 PM I have personally examined this patient, reviewed notes, independently viewed imaging studies, participated in medical decision making and plan of care. I have made any additions or clarifications directly to the above note. Agree with note above. She presented with amaurosis fugax secondary to symptomatic proximal moderate right internal carotid artery stenosis. She remains at risk for neurological worsening, recurrent stroke, TIA and needs ongoing stroke evaluation  And emergent right carotid revascularization. We will order vascular surgery consult and also CT angiogram of the neck to further evaluate the carotid stenosis.greater than 50% time during this  Time minute visit was spent on counseling and coordination of care about a stroke risk  and stroke prevention treatment  Antony Contras, MD Medical Director [facility] Pager: [phone] 09/21/2015 3:38 PM

## 2015-09-21 NOTE — Care Management Note (Signed)
Case Management Note  Patient Details  Name: Tabitha Harris MRN: [id number] Date of Birth: [birth year]  Subjective/Objective:   Pt admitted with TIA. MRI results negative. She is from home alone.                  Action/Plan: Awaiting PT/OT recs. CM following for d/c needs.   Expected Discharge Date:                  Expected Discharge Plan:     In-House Referral:     Discharge planning Services     Post Acute Care Choice:    Choice offered to:     DME Arranged:    DME Agency:     HH Arranged:    HH Agency:     Status of Service:  In process, will continue to follow  Medicare Important Message Given:    Date Medicare IM Given:    Medicare IM give by:    Date Additional Medicare IM Given:    Additional Medicare Important Message give by:     If discussed at [place] of Stay Meetings, dates discussed:    Additional Comments:  Pollie Friar, RN 09/21/2015, 10:50 AM

## 2015-09-21 NOTE — Consult Note (Signed)
Hospital Consult    Reason for Consult:  Symptomatic right ICA stenosis  Referring Physician:  Leonie Man  MRN #:  [id number]  History of Present Illness: This is a 82 y.o. female who presented to the ER yesterday with transient right eye vision loss on Saturday while she was reading the newspaper.  She states that this only lasted a few minutes and she really wasn't alarmed.  She had a regularly scheduled eye appointment on Tuesday.  When she told the MD about what had happened, he sent her to the [facility] ER.  This has now resolved and she has not had any further symptoms nor had she had symptoms before this.  She denies any weakness, paralysis or difficulty speaking.   She has never smoked.  She drinks wine socially.  She is very active playing tennis.  She lives alone and continues to drive.  She is on a CCB for blood pressure support.  She is on a statin for cholesterol management.  She is on synthroid for hypothyroidism.  She denies any hx of atrial fibrillation or chest pain/pressure.     Past Medical History  Diagnosis Date  . History of breast cancer     left  . Hypertension   . Hyperlipidemia   . GERD (gastroesophageal reflux disease)   . Thyroid disease     hypothyroidism  . Hernia     Past Surgical History  Procedure Laterality Date  . Breast lumpectomy  2012    left    Allergies  Allergen Reactions  . Codeine     Prior to Admission medications   Medication Sig Start Date End Date Taking? Authorizing Provider  amLODipine (NORVASC) 5 MG tablet Take 5 mg by mouth daily.     Yes Historical Provider, MD  aspirin 81 MG tablet Take 81 mg by mouth daily.     Yes Historical Provider, MD  atorvastatin (LIPITOR) 20 MG tablet Take 20 mg by mouth daily.     Yes Historical Provider, MD  bimatoprost (LUMIGAN) 0.01 % SOLN 1 drop at bedtime.   Yes Historical Provider, MD  calcium carbonate (OS-CAL) 600 MG TABS Take 600 mg by mouth 2 (two) times daily with a meal.     Yes Historical  Provider, MD  dorzolamide (TRUSOPT) 2 % ophthalmic solution 1 drop 2 (two) times daily.     Yes Historical Provider, MD  dorzolamide-timolol (COSOPT) 22.3-6.8 MG/ML ophthalmic solution  12/13/11  Yes Historical Provider, MD  esomeprazole (NEXIUM) 40 MG capsule Take 40 mg by mouth daily before breakfast.     Yes Historical Provider, MD  fish oil-omega-3 fatty acids 1000 MG capsule Take 2 g by mouth daily.     Yes Historical Provider, MD  levothyroxine (SYNTHROID, LEVOTHROID) 75 MCG tablet Take 75 mcg by mouth daily.     Yes Historical Provider, MD  Multiple Vitamins-Minerals (MULTIVITAMIN WITH MINERALS) tablet Take 1 tablet by mouth daily.     Yes Historical Provider, MD  letrozole (FEMARA) 2.5 MG tablet Take 1 tablet (2.5 mg total) by mouth daily. 08/03/13   Chauncey Cruel, MD    Social History   Social History  . Marital Status: Widowed    Spouse Name: N/A  . Number of Children: N/A  . Years of Education: N/A   Occupational History  . Not on file.   Social History Main Topics  . Smoking status: Never Smoker   . Smokeless tobacco: Not on file  . Alcohol Use: Yes  .  Drug Use: No  . Sexual Activity: Not on file   Other Topics Concern  . Not on file   Social History Narrative     Family History  Problem Relation Age of Onset  . Cancer Mother     breast    ROS: [x]  Positive   [ ]  Negative   [ ]  All sytems reviewed and are negative  Cardiovascular: []  chest pain/pressure []  palpitations []  SOB lying flat []  DOE []  pain in legs while walking []  pain in legs at rest []  pain in legs at night []  non-healing ulcers []  hx of DVT []  swelling in legs  Pulmonary: []  productive cough []  asthma/wheezing []  home O2  Neurologic: []  weakness in []  arms []  legs []  numbness in []  arms []  legs []  hx of CVA []  mini stroke [] difficulty speaking or slurred speech [x]  temporary loss of vision in the right eye []  dizziness  Hematologic: [x]  hx of cancer-breast []  bleeding  problems []  problems with blood clotting easily  Endocrine:   []  diabetes [x]  thyroid disease  GI []  vomiting blood []  blood in stool [x]  GERD  GU: []  CKD/renal failure []  HD--[]  M/W/F or []  T/T/S []  burning with urination []  blood in urine  Psychiatric: []  anxiety []  depression  Musculoskeletal: []  arthritis []  joint pain  Integumentary: []  rashes []  ulcers  Constitutional: []  fever []  chills   Physical Examination  Filed Vitals:   09/21/15 0940 09/21/15 1342  BP: 160/70 151/44  Pulse: 81   Temp: 98.2 F (36.8 C) 98.6 F (37 C)  Resp: 18 18   Body mass index is 20.79 kg/(m^2).  General:  WDWN in NAD Gait: Not observed HENT: WNL, normocephalic Pulmonary: normal non-labored breathing, without Rales, rhonchi,  wheezing Cardiac: regular, without  Murmurs, rubs or gallops; with bilateral carotid bruits Abdomen:  soft, NT/ND, no masses Skin: without rashes Vascular Exam/Pulses:  Right Left  Radial trace 2+ (normal)  Ulnar 1+ (weak) 2+ (normal)  DP 2+ (normal) 2+ (normal)  PT Unable to palpate  Unable to palpate    Extremities: without ischemic changes, without Gangrene , without cellulitis; without open wounds;  Musculoskeletal: no muscle wasting or atrophy  Neurologic: A&O X 3; SENSATION: normal; MOTOR FUNCTION:  moving all extremities equally. Speech is fluent/normal Psychiatric:  Normal affect   CBC    Component Value Date/Time   WBC 6.1 09/20/2015 1437   WBC 5.7 02/28/2012 1203   RBC 3.30* 09/20/2015 1437   RBC 3.11* 02/28/2012 1203   HGB 11.6* 09/20/2015 1447   HGB 11.3* 02/28/2012 1203   HCT 34.0* 09/20/2015 1447   HCT 32.8* 02/28/2012 1203   PLT 302 09/20/2015 1437   PLT 250 02/28/2012 1203   MCV 103.0* 09/20/2015 1437   MCV 105.7* 02/28/2012 1203   MCH 34.5* 09/20/2015 1437   MCH 36.5* 02/28/2012 1203   MCHC 33.5 09/20/2015 1437   MCHC 34.5 02/28/2012 1203   RDW 14.6 09/20/2015 1437   RDW 14.7* 02/28/2012 1203   LYMPHSABS 2.3  09/20/2015 1437   LYMPHSABS 2.5 02/28/2012 1203   MONOABS 0.8 09/20/2015 1437   MONOABS 0.7 02/28/2012 1203   EOSABS 0.2 09/20/2015 1437   EOSABS 0.2 02/28/2012 1203   BASOSABS 0.1 09/20/2015 1437   BASOSABS 0.1 02/28/2012 1203    BMET    Component Value Date/Time   NA 143 09/20/2015 1447   NA 142 02/28/2012 1203   K 4.2 09/20/2015 1447   K 4.5 02/28/2012 1203   CL  104 09/20/2015 1447   CL 109* 02/28/2012 1203   CO2 27 09/20/2015 1437   CO2 29 02/28/2012 1203   GLUCOSE 98 09/20/2015 1447   GLUCOSE 96 02/28/2012 1203   BUN 13 09/20/2015 1447   BUN 14.0 02/28/2012 1203   CREATININE 0.70 09/20/2015 1447   CREATININE 0.8 02/28/2012 1203   CALCIUM 9.5 09/20/2015 1437   CALCIUM 9.9 02/28/2012 1203   GFRNONAA >60 09/20/2015 1437   GFRAA >60 09/20/2015 1437    COAGS: Lab Results  Component Value Date   INR 1.07 09/20/2015     Non-Invasive Vascular Imaging:   Vascular Ultrasound Carotid Duplex (Doppler) has been completed.  Findings suggest 60-79% right internal carotid artery stenosis by elevated end diastolic velocities, however peak systolic velocities and ICA/CCA ratio suggest 80-99% right internal carotid artery stenosis.  Right vertebral artery is patent with antegrade flow.  Left internal carotid artery exhibits 40-59% stenosis by elevated velocities.  Left vertebral artery is patent with retrograde flow.  2D echocardiogram 09/21/15: results pending  MRI 09/20/15: IMPRESSION: MRI HEAD IMPRESSION:  1. No acute intracranial infarct or other process identified. 2. Remote lacunar infarct within the ventral right thalamus. 3. Generalized age-related cerebral atrophy with mild chronic microvascular ischemic disease.  MRA HEAD IMPRESSION 09/20/15:  Normal intracranial MRA. No large or proximal arterial branch occlusion. No high-grade or correctable stenosis.   Statin:  Yes.   Beta Blocker:  No. Aspirin:  Yes.   ACEI:  No. ARB:  No. Other  antiplatelets/anticoagulants:  Yes.   heparin SQ (DVT prophylaxis)   ASSESSMENT/PLAN: This is a 82 y.o. female with symptomatic right carotid artery stenosis   -pt had transient right eye vision loss on Saturday.  Carotid duplex reveals 80-99% right carotid artery stenosis. -continue aspirin/statin -pt continues to be active and lives alone, drives and plays tennis. -recommend right carotid endarterectomy to prevent future TIA/CVA, but will need cardiac clearance given slightly abnormal EKG    Leontine Locket, PA-C [facility] [phone]  Agree with above assessment Plan cardiology consult and tentatively plan right carotid endarterectomy for Friday-depending patient's desires and other input

## 2015-09-21 NOTE — Consult Note (Signed)
Cardiology Consult    Patient ID: TANIYHA CRONIN MRN: [id number], DOB/AGE: 1919/11/29   Admit date: 09/20/2015 Date of Consult: 09/21/2015  Primary Physician: Merrilee Seashore, MD Reason for Consult: Preoperative Clearance Primary Cardiologist: New Requesting Provider: Dr. Dyann Kief   History of Present Illness    ASUSENA BOGGESS is a 82 y.o. female with past medical history of HTN, HLD, Hypothyroidism, and breast cancer (s/p lumpectomy 2012) who presented to [facility] ED on 09/20/2015 for vision changes.  She reports having one episode of transient vision loss of her right eye for several minutes Saturday night (09/17/2015). She was seen in the office by her Ophthalmologist later that week and he referred her to the ED for work-up of a possible stroke.  CT Imaging showed no acute intracranial abnormalities. Moderate to severe generalized atrophy and chronic microvascular ischemic changes were noted. MRI Head further confirmed no acute intracranial processes. A remote lacunar infarct within the ventral right thalamus was noted. Carotid dopplers have shown  60-79% right internal carotid artery stenosis by elevated end diastolic velocities, however peak systolic velocities and ICA/CCA ratio suggest 80-99% right internal carotid artery stenosis. Left internal carotid artery showed 40-59% stenosis.  She has therefore been diagnosed with Amaurosis Fugax secondary to R ICA stenosis. Vascular surgery has been consulted to perform a right carotid endarterectomy.  From a cardiac perspective, her initial troponin upon admission was negative. Lipid panel shows an LDL of 64. A1c pending. EKG shows NSR, HR 74, and no acute ST or T-wave changes. Echo shows an EF of 65-70% with no wall motion abnormalities. Grade 2 DD noted along with aortic sclerosis but no stenosis.   In talking with the patient, she is very active at baseline. She lives by herself and performs all activities independently. She plays  doubles tennis regularly, having played last Thursday without any symptoms.  She denies any recent chest pain, dyspnea at rest, dyspnea with exertion, orthopnea, PND, or palpitations. She denies any prior cardiac history. No known family history of heart disease. Denies any prior tobacco abuse.  Her main concern at the time of my encounter is that she does not want to stay in the hospital any longer. She is concerned about her home due to not cancelling the mail and making no arrangements for someone to check on her place. Her closest of kin is a daughter-in-law that lives in [place], [place] and she is worried her schedule might not allow for her to come to [facility] to stay with her post-op if she has the procedure this Friday. She realizes the importance of having this procedure but does not wish to remain in the hospital and would rather come back for it next week.   Past Medical History   Past Medical History  Diagnosis Date  . History of breast cancer     left  . Hypertension   . Hyperlipidemia   . GERD (gastroesophageal reflux disease)   . Thyroid disease     hypothyroidism  . Hernia     Past Surgical History  Procedure Laterality Date  . Breast lumpectomy  2012    left     Allergies  Allergies  Allergen Reactions  . Codeine     Inpatient Medications    . amLODipine  5 mg Oral Daily  . aspirin  81 mg Oral Daily  . atorvastatin  20 mg Oral q1800  . calcium carbonate  1 tablet Oral BID WC  . dorzolamide  1 drop Both  Eyes BID  . heparin  5,000 Units Subcutaneous Q8H  . latanoprost  1 drop Both Eyes QHS  . levothyroxine  75 mcg Oral QAC breakfast  . multivitamin with minerals  1 tablet Oral Daily  . omega-3 acid ethyl esters  2 g Oral Daily  . pantoprazole  40 mg Oral Daily    Family History    Family History  Problem Relation Age of Onset  . Cancer Mother     breast    Social History    Social History   Social History  . Marital Status: Widowed    Spouse Name:  N/A  . Number of Children: N/A  . Years of Education: N/A   Occupational History  . Not on file.   Social History Main Topics  . Smoking status: Never Smoker   . Smokeless tobacco: Not on file  . Alcohol Use: Yes  . Drug Use: No  . Sexual Activity: Not on file   Other Topics Concern  . Not on file   Social History Narrative     Review of Systems    General:  No chills, fever, night sweats or weight changes.  Cardiovascular:  No chest pain, dyspnea on exertion, edema, orthopnea, palpitations, paroxysmal nocturnal dyspnea. Dermatological: No rash, lesions/masses Respiratory: No cough, dyspnea Urologic: No hematuria, dysuria Abdominal:   No nausea, vomiting, diarrhea, bright red blood per rectum, melena, or hematemesis Neurologic:  No wkns, changes in mental status. Positive for vision changes. All other systems reviewed and are otherwise negative except as noted above.  Physical Exam    Blood pressure 151/44, pulse 81, temperature 98.6 F (37 C), temperature source Oral, resp. rate 18, height 4\' 11"  (1.499 m), weight 103 lb (46.72 kg), SpO2 95 %.  General: Pleasant, elderly female appearing in NAD. Psych: Normal affect. Neuro: Alert and oriented X 3. Moves all extremities spontaneously. HEENT: Normal  Neck: Supple without bruits or JVD. Lungs:  Resp regular and unlabored, CTA without wheezing or rales. Heart: RRR no s3, s4, 2/6 SEM at RUSB. Abdomen: Soft, non-tender, non-distended, BS + x 4.  Extremities: No clubbing, cyanosis or edema. DP/PT/Radials 2+ and equal bilaterally.  Labs    Troponin [facility] of Care Test)  Recent Labs  09/20/15 1446  TROPIPOC 0.01   No results for input(s): CKTOTAL, CKMB, TROPONINI in the last 72 hours. Lab Results  Component Value Date   WBC 6.1 09/20/2015   HGB 11.6* 09/20/2015   HCT 34.0* 09/20/2015   MCV 103.0* 09/20/2015   PLT 302 09/20/2015    Recent Labs Lab 09/20/15 1437 09/20/15 1447  NA 140 143  K 4.1 4.2  CL 106  104  CO2 27  --   BUN 11 13  CREATININE 0.80 0.70  CALCIUM 9.5  --   PROT 6.7  --   BILITOT 1.2  --   ALKPHOS 47  --   ALT 22  --   AST 27  --   GLUCOSE 101* 98   Lab Results  Component Value Date   CHOL 136 09/21/2015   HDL 60 09/21/2015   LDLCALC 64 09/21/2015   TRIG 59 09/21/2015   No results found for: [facility]   Radiology Studies    Ct Head Wo Contrast: 09/20/2015  CLINICAL DATA:  82 year old with amaurosis fugax involving the right eye on Saturday with loss of vision for approximately 5 min. EXAM: CT HEAD WITHOUT CONTRAST TECHNIQUE: Contiguous axial images were obtained from the base of the skull through  the vertex without intravenous contrast. COMPARISON:  None. FINDINGS: Moderate to severe cortical and deep atrophy related to patient age. Mild cerebellar atrophy. Moderate to severe changes of small vessel disease of the white matter diffusely, including the midbrain and pons. Physiologic calcification in the right basal ganglia. No mass lesion. No midline shift. No acute hemorrhage or hematoma. No extra-axial fluid collections. No evidence of acute infarction. No skull fracture or other focal osseous abnormality involving the skull. Mucosal thickening involving the sphenoid sinuses and scattered bilateral ethmoid air cells. Remaining visualized paranasal sinuses, bilateral mastoid air cells and bilateral middle ear cavities well-aerated. Extensive bilateral carotid siphon atherosclerosis. IMPRESSION: 1. No acute intracranial abnormality. 2. Moderate to severe generalized atrophy and chronic microvascular ischemic changes of the white matter. 3. Mild chronic bilateral ethmoid and sphenoid sinus disease. Electronically Signed   By: Evangeline Dakin M.D.   On: 09/20/2015 15:52   Mr Jodene Nam Head/brain [id number] Cm: 09/21/2015  CLINICAL DATA:  Initial evaluation for acute transient right-sided visual loss. EXAM: MRI HEAD WITHOUT CONTRAST MRA HEAD WITHOUT CONTRAST TECHNIQUE: Multiplanar, multiecho pulse  sequences of the brain and surrounding structures were obtained without intravenous contrast. Angiographic images of the head were obtained using MRA technique without contrast. COMPARISON:  Prior CT from earlier the same day FINDINGS: MRI HEAD FINDINGS Diffuse prominence of the CSF containing spaces is compatible with generalized age-related cerebral atrophy. Patchy T2/FLAIR hyperintensity within the periventricular, deep, and subcortical white matter both cerebral hemispheres most consistent with chronic small vessel ischemic disease, mild for age. Remote lacunar infarct present within the ventral right thalamus. No remote cortical infarct. No abnormal foci of restricted diffusion to suggest acute infarct. Gray-white matter differentiation maintained. Major intracranial vascular flow voids are preserved. No acute or chronic intracranial hemorrhage. No mass lesion, midline shift, or mass effect. Ventricular prominence related to global parenchymal volume loss without hydrocephalus. No extra-axial fluid collection. Major dural sinuses are grossly patent. Craniocervical junction within normal limits. Degenerative disc bulge noted at C3-4 with resultant mild canal stenosis. Pituitary gland within normal limits. No acute abnormality about the orbits. Patient is status post bilateral cataract extraction. Scattered mucosal thickening within the paranasal sinuses, likely inflammatory. No air-fluid levels to suggest active sinus infection. No mastoid effusion. Inner ear structures grossly normal. Bone marrow signal intensity within normal limits. No scalp soft tissue abnormality. MRA HEAD FINDINGS ANTERIOR CIRCULATION: Distal cervical segments of the internal carotid arteries are widely patent. Petrous, cavernous, and supraclinoid segments patent without flow limiting stenosis A1 segments patent. Right A1 segment hypoplastic. Anterior communicating artery normal. Anterior cerebral arteries well opacified. M1 segments  patent without stenosis or occlusion. MCA bifurcations normal. Distal MCA branches well opacified and symmetric. POSTERIOR CIRCULATION: Vertebral arteries patent to the vertebrobasilar junction. Posterior inferior cerebral arteries patent bilaterally. Basilar artery widely patent. Superior cerebellar arteries patent bilaterally. Both of the posterior cerebral arteries arise from the basilar artery no well opacified to their distal aspects. IMPRESSION: MRI HEAD IMPRESSION: 1. No acute intracranial infarct or other process identified. 2. Remote lacunar infarct within the ventral right thalamus. 3. Generalized age-related cerebral atrophy with mild chronic microvascular ischemic disease. MRA HEAD IMPRESSION: Normal intracranial MRA. No large or proximal arterial branch occlusion. No high-grade or correctable stenosis. Electronically Signed   By: Jeannine Boga M.D.   On: 09/21/2015 00:07    EKG & Cardiac Imaging    EKG:  NSR, HR 74, and no acute ST or T-wave changes  Echocardiogram: 09/21/2015 Study Conclusions: - Left ventricle:  The cavity size was normal. There was mild focal  basal hypertrophy of the septum. Systolic function was vigorous.  The estimated ejection fraction was in the range of 65% to 70%.  Wall motion was normal; there were no regional wall motion  abnormalities. Features are consistent with a pseudonormal left  ventricular filling pattern, with concomitant abnormal relaxation  and increased filling pressure (grade 2 diastolic dysfunction). - Aortic valve: Severely calcified annulus. Trileaflet. Mild  diffuse thickening and calcification, consistent with sclerosis. - Mitral valve: Calcified annulus. - Pulmonary arteries: PA peak pressure: 33 mm Hg (S).   Assessment & Plan    1. Preoperative Clearance for R Carotid Endarterectomy - presented with one episode of transient vision loss. CT Imaging and MRI showed no acute intracranial abnormalities. Carotid dopplers  have shown 60-79% right internal carotid artery stenosis by elevated end diastolic velocities, however peak systolic velocities and ICA/CCA ratio suggest 80-99% right ICA stenosis. Vascular surgery has been consulted to perform a right carotid endarterectomy. - EKG shows NSR, HR 74, and no acute ST or T-wave changes. Echo shows an EF of 65-70% with no wall motion abnormalities. Grade 2 DD noted along with aortic sclerosis but no stenosis.  - denies any recent anginal symptoms. Plays tennis several times per week without any symptoms. No prior cardiac history.  - from a cardiac perspective, she would likely be of moderate-risk for the procedure due to age alone. With her EKG imaging, normal EF by echo with no wall motion abnormalities, and active lifestyle with no anginal symptoms, would not pursue further cardiac workup prior to her procedure.  2. HLD - continue statin therapy.  3. HTN - BP has been elevated in the past 24 hours. - currently on Amlodipine 5mg  daily. Could consider titration to 10mg  daily if BP remains elevated.  Signed, Erma Heritage, PA-C 09/21/2015, 3:54 PM Pager: [phone]  I have seen and examined the patient along with Erma Heritage, PA-C.  I have reviewed the chart, notes and new data.  I agree with PA's note.  Key new complaints: high functional status, no cardiac complaints despite very active lifestyle Key examination changes: no arrhythmia, early peaking systolic aortic murmur Key new findings / data: echo essentially normal, normal ECG  PLAN: Low-to-moderate risk for major CV complications with the planned surgery. Risk is increased primarily due to chronological age (although she is biologically younger) and the vascular nature of the surgery. I do not think additional cardiac testing is necessary before the planned carotid surgery.  Systolic BP is mildly elevated, but DBP is quite low. Would not change current BP meds.  Sanda Klein, MD,  [place] [phone] 09/21/2015, 5:12 PM

## 2015-09-21 NOTE — Progress Notes (Signed)
Echocardiogram 2D Echocardiogram has been performed.  Tresa Res 09/21/2015, 2:38 PM

## 2015-09-21 NOTE — Evaluation (Signed)
SLP Cancellation Note  Patient Details Name: Tabitha Harris MRN: [id number] DOB: Feb 03, 1920   Cancelled treatment:       Reason Eval/Treat Not Completed: Other (comment) (pt with therapy at this time, will reattempt)   Luanna Salk, [facility] SLP [phone]

## 2015-09-21 NOTE — Progress Notes (Signed)
[facility] PROGRESS NOTE  Tabitha Harris [id number]:[id number] DOB: 1919/10/26 DOA: 09/20/2015 PCP: Merrilee Seashore, MD  Interim summary and HPI 82 y.o. female Who presented complaining of transient vision loss of right eye field which lasted for several minutes which occurred day prior to admission. The problem came on insidiously. Resolved on its own. Nothing she is aware of makes it better or worse. Patient presented to her ophthalmologist recommended coming to the ED for stroke workup.  Found to have TIA secondary to right ICA stenosis; vascular surgery consulted. Plan is for endarterectomy on 09/23/15 (hopefully)  Assessment/Plan: 1-transient right vision loss: appears to be TIA secondary to right ICA stenosis -no acute stroke appreciated on CT or MRI -normal ESR and CRP -normal lipid panel -A1C pending -patient will need treatment of right ICA stenosis and vascular surgery was consulted. Plan is for endarterectomy on 09/23/15 -cardiology has clear patient for surgery  2-Right ICA stenosis -as mentioned above, vascular surgery consulted and with plans for endarterectomy -will continue aspirin and statins   3-HTN: -stable overall -will continue current antihypertensive regimen   4-HLD: -will continue statins  5-hypothyroidism -continue synthroid  6-elevated intraocular pressure/glaucoma -continue xalatan and trusopt   7-GERD: -continue PPI  Code Status: Full Code Family Communication: no family at bedside  Disposition Plan: plan is for carotid endarterectomy on 09/23/15. Clear for surgery by cardiology.   Consultants:  Neurology  Vascular surgery   Cardiology service   Procedures:  2-D echo: pending  Carotid duplex: Carotid Duplex (Doppler) has been completed.  Findings suggest 60-79% right internal carotid artery stenosis by elevated end diastolic velocities, however peak systolic velocities and ICA/CCA ratio suggest 80-99% right internal carotid artery  stenosis.  Right vertebral artery is patent with antegrade flow.  Left internal carotid artery exhibits 40-59% stenosis by elevated velocities.  Left vertebral artery is patent with retrograde flow.  Antibiotics:  None   HPI/Subjective: Afebrile, no CP or SOB. Reports no further vision impairment or other neurological deficit   Objective: Filed Vitals:   09/21/15 0940 09/21/15 1342  BP: 160/70 151/44  Pulse: 81   Temp: 98.2 F (36.8 C) 98.6 F (37 C)  Resp: 18 18    Intake/Output Summary (Last 24 hours) at 09/21/15 1747 Last data filed at 09/21/15 1300  Gross per 24 hour  Intake    660 ml  Output      0 ml  Net    660 ml   Filed Weights   09/20/15 1420 09/20/15 1944  Weight: 46.72 kg (103 lb) 46.72 kg (103 lb)    Exam:   General:  Afebrile, feeling ok and no complaining of CP, SOB or further vision problems.  Cardiovascular: S1 and S2, no rubs or gallops   Respiratory: CTA bilaterally  Abdomen: soft, NT, ND, positive BS  Musculoskeletal: no edema or cyanosis   Data Reviewed: Basic Metabolic Panel:  Recent Labs Lab 09/20/15 1437 09/20/15 1447  NA 140 143  K 4.1 4.2  CL 106 104  CO2 27  --   GLUCOSE 101* 98  BUN 11 13  CREATININE 0.80 0.70  CALCIUM 9.5  --    Liver Function Tests:  Recent Labs Lab 09/20/15 1437  AST 27  ALT 22  ALKPHOS 47  BILITOT 1.2  PROT 6.7  ALBUMIN 3.9   CBC:  Recent Labs Lab 09/20/15 1437 09/20/15 1447  WBC 6.1  --   NEUTROABS 2.7  --   HGB 11.4* 11.6*  HCT 34.0* 34.0*  MCV  103.0*  --   PLT 302  --    Studies: Ct Head Wo Contrast  09/20/2015  CLINICAL DATA:  82 year old with amaurosis fugax involving the right eye on Saturday with loss of vision for approximately 5 min. EXAM: CT HEAD WITHOUT CONTRAST TECHNIQUE: Contiguous axial images were obtained from the base of the skull through the vertex without intravenous contrast. COMPARISON:  None. FINDINGS: Moderate to severe cortical and deep atrophy related  to patient age. Mild cerebellar atrophy. Moderate to severe changes of small vessel disease of the white matter diffusely, including the midbrain and pons. Physiologic calcification in the right basal ganglia. No mass lesion. No midline shift. No acute hemorrhage or hematoma. No extra-axial fluid collections. No evidence of acute infarction. No skull fracture or other focal osseous abnormality involving the skull. Mucosal thickening involving the sphenoid sinuses and scattered bilateral ethmoid air cells. Remaining visualized paranasal sinuses, bilateral mastoid air cells and bilateral middle ear cavities well-aerated. Extensive bilateral carotid siphon atherosclerosis. IMPRESSION: 1. No acute intracranial abnormality. 2. Moderate to severe generalized atrophy and chronic microvascular ischemic changes of the white matter. 3. Mild chronic bilateral ethmoid and sphenoid sinus disease. Electronically Signed   By: Evangeline Dakin M.D.   On: 09/20/2015 15:52   Mr Brain Wo Contrast  09/21/2015  CLINICAL DATA:  Initial evaluation for acute transient right-sided visual loss. EXAM: MRI HEAD WITHOUT CONTRAST MRA HEAD WITHOUT CONTRAST TECHNIQUE: Multiplanar, multiecho pulse sequences of the brain and surrounding structures were obtained without intravenous contrast. Angiographic images of the head were obtained using MRA technique without contrast. COMPARISON:  Prior CT from earlier the same day FINDINGS: MRI HEAD FINDINGS Diffuse prominence of the CSF containing spaces is compatible with generalized age-related cerebral atrophy. Patchy T2/FLAIR hyperintensity within the periventricular, deep, and subcortical white matter both cerebral hemispheres most consistent with chronic small vessel ischemic disease, mild for age. Remote lacunar infarct present within the ventral right thalamus. No remote cortical infarct. No abnormal foci of restricted diffusion to suggest acute infarct. Gray-white matter differentiation  maintained. Major intracranial vascular flow voids are preserved. No acute or chronic intracranial hemorrhage. No mass lesion, midline shift, or mass effect. Ventricular prominence related to global parenchymal volume loss without hydrocephalus. No extra-axial fluid collection. Major dural sinuses are grossly patent. Craniocervical junction within normal limits. Degenerative disc bulge noted at C3-4 with resultant mild canal stenosis. Pituitary gland within normal limits. No acute abnormality about the orbits. Patient is status post bilateral cataract extraction. Scattered mucosal thickening within the paranasal sinuses, likely inflammatory. No air-fluid levels to suggest active sinus infection. No mastoid effusion. Inner ear structures grossly normal. Bone marrow signal intensity within normal limits. No scalp soft tissue abnormality. MRA HEAD FINDINGS ANTERIOR CIRCULATION: Distal cervical segments of the internal carotid arteries are widely patent. Petrous, cavernous, and supraclinoid segments patent without flow limiting stenosis A1 segments patent. Right A1 segment hypoplastic. Anterior communicating artery normal. Anterior cerebral arteries well opacified. M1 segments patent without stenosis or occlusion. MCA bifurcations normal. Distal MCA branches well opacified and symmetric. POSTERIOR CIRCULATION: Vertebral arteries patent to the vertebrobasilar junction. Posterior inferior cerebral arteries patent bilaterally. Basilar artery widely patent. Superior cerebellar arteries patent bilaterally. Both of the posterior cerebral arteries arise from the basilar artery no well opacified to their distal aspects. IMPRESSION: MRI HEAD IMPRESSION: 1. No acute intracranial infarct or other process identified. 2. Remote lacunar infarct within the ventral right thalamus. 3. Generalized age-related cerebral atrophy with mild chronic microvascular ischemic disease. MRA HEAD IMPRESSION:  Normal intracranial MRA. No large or  proximal arterial branch occlusion. No high-grade or correctable stenosis. Electronically Signed   By: Jeannine Boga M.D.   On: 09/21/2015 00:07   Mr Jodene Nam Head/brain Wo Cm  09/21/2015  CLINICAL DATA:  Initial evaluation for acute transient right-sided visual loss. EXAM: MRI HEAD WITHOUT CONTRAST MRA HEAD WITHOUT CONTRAST TECHNIQUE: Multiplanar, multiecho pulse sequences of the brain and surrounding structures were obtained without intravenous contrast. Angiographic images of the head were obtained using MRA technique without contrast. COMPARISON:  Prior CT from earlier the same day FINDINGS: MRI HEAD FINDINGS Diffuse prominence of the CSF containing spaces is compatible with generalized age-related cerebral atrophy. Patchy T2/FLAIR hyperintensity within the periventricular, deep, and subcortical white matter both cerebral hemispheres most consistent with chronic small vessel ischemic disease, mild for age. Remote lacunar infarct present within the ventral right thalamus. No remote cortical infarct. No abnormal foci of restricted diffusion to suggest acute infarct. Gray-white matter differentiation maintained. Major intracranial vascular flow voids are preserved. No acute or chronic intracranial hemorrhage. No mass lesion, midline shift, or mass effect. Ventricular prominence related to global parenchymal volume loss without hydrocephalus. No extra-axial fluid collection. Major dural sinuses are grossly patent. Craniocervical junction within normal limits. Degenerative disc bulge noted at C3-4 with resultant mild canal stenosis. Pituitary gland within normal limits. No acute abnormality about the orbits. Patient is status post bilateral cataract extraction. Scattered mucosal thickening within the paranasal sinuses, likely inflammatory. No air-fluid levels to suggest active sinus infection. No mastoid effusion. Inner ear structures grossly normal. Bone marrow signal intensity within normal limits. No scalp  soft tissue abnormality. MRA HEAD FINDINGS ANTERIOR CIRCULATION: Distal cervical segments of the internal carotid arteries are widely patent. Petrous, cavernous, and supraclinoid segments patent without flow limiting stenosis A1 segments patent. Right A1 segment hypoplastic. Anterior communicating artery normal. Anterior cerebral arteries well opacified. M1 segments patent without stenosis or occlusion. MCA bifurcations normal. Distal MCA branches well opacified and symmetric. POSTERIOR CIRCULATION: Vertebral arteries patent to the vertebrobasilar junction. Posterior inferior cerebral arteries patent bilaterally. Basilar artery widely patent. Superior cerebellar arteries patent bilaterally. Both of the posterior cerebral arteries arise from the basilar artery no well opacified to their distal aspects. IMPRESSION: MRI HEAD IMPRESSION: 1. No acute intracranial infarct or other process identified. 2. Remote lacunar infarct within the ventral right thalamus. 3. Generalized age-related cerebral atrophy with mild chronic microvascular ischemic disease. MRA HEAD IMPRESSION: Normal intracranial MRA. No large or proximal arterial branch occlusion. No high-grade or correctable stenosis. Electronically Signed   By: Jeannine Boga M.D.   On: 09/21/2015 00:07    Scheduled Meds: . amLODipine  5 mg Oral Daily  . aspirin  81 mg Oral Daily  . atorvastatin  20 mg Oral q1800  . calcium carbonate  1 tablet Oral BID WC  . dorzolamide  1 drop Both Eyes BID  . heparin  5,000 Units Subcutaneous Q8H  . latanoprost  1 drop Both Eyes QHS  . levothyroxine  75 mcg Oral QAC breakfast  . multivitamin with minerals  1 tablet Oral Daily  . omega-3 acid ethyl esters  2 g Oral Daily  . pantoprazole  40 mg Oral Daily   Continuous Infusions:   Active Problems:   TIA (transient ischemic attack)   Stroke-like symptom   Carotid stenosis   Preoperative cardiovascular examination    Time spent: 35 minutes    Barton Dubois  [facility] Pager [phone]. If 7PM-7AM, please contact night-coverage at [email or web address],  password [facility] 09/21/2015, 5:47 PM  LOS: 1 day

## 2015-09-21 NOTE — Progress Notes (Addendum)
*  PRELIMINARY RESULTS* Vascular Ultrasound Carotid Duplex (Doppler) has been completed.   Findings suggest 60-79% right internal carotid artery stenosis by elevated end diastolic velocities, however peak systolic velocities and ICA/CCA ratio suggest 80-99% right internal carotid artery stenosis.  Right vertebral artery is patent with antegrade flow.  Left internal carotid artery exhibits 40-59% stenosis by elevated velocities.  Left vertebral artery is patent with retrograde flow.   Preliminary results discussed with Dr. Dyann Kief.  09/21/2015 12:27 PM Maudry Mayhew, RVT, RDCS, RDMS

## 2015-09-21 NOTE — Progress Notes (Signed)
Occupational Therapy Evaluation/Discharge Patient Details Name: Tabitha Harris MRN: [id number] DOB: 1919-10-19 Today's Date: 09/21/2015    History of Present Illness Patient is a 82 y/o female with hx of breast ca, HTN, HLD presents with transient vision loss of right eye, which resolved after a few minutes. MRI and CT unremarkable. Workup pending.   Clinical Impression   Patient has been evaluated by Occupational Therapy with no acute needs identified. Pt completed all ADLs and transfers at mod I level. Pt appears to have some R peripheral visual field loss (50 degrees) and encouraged pt to follow up with her opthalmologist. All education has been completed and pt has no further questions. Pt with no further acute OT needs. OT signing off.    Follow Up Recommendations  Other (comment) (follow up with ophthamologist)    Equipment Recommendations  None recommended by OT    Recommendations for Other Services       Precautions / Restrictions Precautions Precautions: Fall Restrictions Weight Bearing Restrictions: No      Mobility Bed Mobility Overal bed mobility: Modified Independent             General bed mobility comments: Up in chair upon PT arrival.   Transfers Overall transfer level: Needs assistance Equipment used: None Transfers: Sit to/from Stand Sit to Stand: Modified independent (Device/Increase time)         General transfer comment: Stood from chair without difficulty.     Balance Overall balance assessment: Needs assistance Sitting-balance support: Feet supported;No upper extremity supported Sitting balance-Leahy Scale: Good     Standing balance support: During functional activity Standing balance-Leahy Scale: Good               High level balance activites: Direction changes;Turns;Sudden stops;Head turns High Level Balance Comments: tolerated above with only mild deviations in gait path but no overt LOB. Drifting noted with head turns.   Standardized Balance Assessment Standardized Balance Assessment : Dynamic Gait Index   Dynamic Gait Index Level Surface: Normal Change in Gait Speed: Normal Gait with Horizontal Head Turns: Mild Impairment Gait with Vertical Head Turns: Mild Impairment Gait and Pivot Turn: Mild Impairment Step Over Obstacle: Normal Step Around Obstacles: Normal Steps: Mild Impairment Total Score: 20      ADL Overall ADL's : Modified independent                                       General ADL Comments: No LOB or physical assist, or DME required     Vision Vision Assessment?: Yes Eye Alignment: Within Functional Limits Ocular Range of Motion: Within Functional Limits Alignment/Gaze Preference: Within Defined Limits Tracking/Visual Pursuits: Able to track stimulus in all quads without difficulty Saccades: Within functional limits Convergence: Within functional limits Visual Fields: Right visual field deficit (50 degree R side peripheral vision loss)   Perception Perception Perception Tested?: Yes Perception Deficits: Inattention/neglect Inattention/Neglect: Appears intact   Praxis Praxis Praxis tested?: Within functional limits    Pertinent Vitals/Pain Pain Assessment: No/denies pain     Hand Dominance Right   Extremity/Trunk Assessment Upper Extremity Assessment Upper Extremity Assessment: Defer to OT evaluation   Lower Extremity Assessment Lower Extremity Assessment: Overall WFL for tasks assessed   Cervical / Trunk Assessment Cervical / Trunk Assessment: Other exceptions Cervical / Trunk Exceptions: curvature of the spine, scoliosis of some kind   Communication Communication Communication: No difficulties   Cognition  Arousal/Alertness: Awake/alert Behavior During Therapy: WFL for tasks assessed/performed Overall Cognitive Status: Within Functional Limits for tasks assessed                     General Comments       Exercises        Shoulder Instructions      Home Living Family/patient expects to be discharged to:: Private residence Living Arrangements: Alone   Type of Home: Other(Comment) (town home) Home Access: Level entry     Home Layout: Two level;Bed/bath upstairs Alternate Level Stairs-Number of Steps: 1 flight Alternate Level Stairs-Rails: Right;Left                      Prior Functioning/Environment Level of Independence: Independent        Comments: Drives, cooks, plays tennis    OT Diagnosis:     OT Problem List: Impaired vision/perception   OT Treatment/Interventions:      OT Goals(Current goals can be found in the care plan section) Acute Rehab OT Goals Patient Stated Goal: to get out of here OT Goal Formulation: With patient Time For Goal Achievement: 10/05/15 Potential to Achieve Goals: Good  OT Frequency:     Barriers to D/C:            Co-evaluation              End of Session Equipment Utilized During Treatment: Gait belt Nurse Communication: Mobility status  Activity Tolerance: Patient tolerated treatment well Patient left: in chair;with call bell/phone within reach;with chair alarm set   Time: 0922-0943 OT Time Calculation (min): 21 min Charges:  OT General Charges $OT Visit: 1 Procedure OT Evaluation $OT Eval Low Complexity: 1 Procedure G-Codes: OT G-codes **NOT FOR INPATIENT CLASS** Functional Assessment Tool Used: clinical judgement Functional Limitation: Self care Self Care Current Status [id number]): 0 percent impaired, limited or restricted Self Care Goal Status [id number]): 0 percent impaired, limited or restricted Self Care Discharge Status [id number]): 0 percent impaired, limited or restricted  Redmond Baseman, OTR/L Pager: [phone] 09/21/2015, 11:16 AM

## 2015-09-22 DIAGNOSIS — J3800 Paralysis of vocal cords and larynx, unspecified: Secondary | ICD-10-CM | POA: Diagnosis not present

## 2015-09-22 DIAGNOSIS — R490 Dysphonia: Secondary | ICD-10-CM | POA: Diagnosis not present

## 2015-09-22 DIAGNOSIS — K219 Gastro-esophageal reflux disease without esophagitis: Secondary | ICD-10-CM | POA: Diagnosis not present

## 2015-09-22 DIAGNOSIS — D62 Acute posthemorrhagic anemia: Secondary | ICD-10-CM | POA: Diagnosis not present

## 2015-09-22 DIAGNOSIS — I6521 Occlusion and stenosis of right carotid artery: Secondary | ICD-10-CM | POA: Diagnosis not present

## 2015-09-22 DIAGNOSIS — Z79899 Other long term (current) drug therapy: Secondary | ICD-10-CM | POA: Diagnosis not present

## 2015-09-22 DIAGNOSIS — R2990 Unspecified symptoms and signs involving the nervous system: Secondary | ICD-10-CM | POA: Diagnosis not present

## 2015-09-22 DIAGNOSIS — Z853 Personal history of malignant neoplasm of breast: Secondary | ICD-10-CM | POA: Diagnosis not present

## 2015-09-22 DIAGNOSIS — I110 Hypertensive heart disease with heart failure: Secondary | ICD-10-CM | POA: Diagnosis present

## 2015-09-22 DIAGNOSIS — J3801 Paralysis of vocal cords and larynx, unilateral: Secondary | ICD-10-CM | POA: Diagnosis present

## 2015-09-22 DIAGNOSIS — Z8673 Personal history of transient ischemic attack (TIA), and cerebral infarction without residual deficits: Secondary | ICD-10-CM | POA: Diagnosis not present

## 2015-09-22 DIAGNOSIS — G459 Transient cerebral ischemic attack, unspecified: Secondary | ICD-10-CM | POA: Diagnosis not present

## 2015-09-22 DIAGNOSIS — I10 Essential (primary) hypertension: Secondary | ICD-10-CM | POA: Diagnosis not present

## 2015-09-22 DIAGNOSIS — E039 Hypothyroidism, unspecified: Secondary | ICD-10-CM | POA: Diagnosis present

## 2015-09-22 DIAGNOSIS — H409 Unspecified glaucoma: Secondary | ICD-10-CM | POA: Diagnosis present

## 2015-09-22 DIAGNOSIS — Z7982 Long term (current) use of aspirin: Secondary | ICD-10-CM | POA: Diagnosis not present

## 2015-09-22 DIAGNOSIS — I700 Atherosclerosis of aorta: Secondary | ICD-10-CM | POA: Diagnosis present

## 2015-09-22 DIAGNOSIS — E785 Hyperlipidemia, unspecified: Secondary | ICD-10-CM | POA: Diagnosis not present

## 2015-09-22 DIAGNOSIS — G453 Amaurosis fugax: Secondary | ICD-10-CM | POA: Diagnosis not present

## 2015-09-22 DIAGNOSIS — I5032 Chronic diastolic (congestive) heart failure: Secondary | ICD-10-CM | POA: Diagnosis present

## 2015-09-22 DIAGNOSIS — H40059 Ocular hypertension, unspecified eye: Secondary | ICD-10-CM | POA: Diagnosis present

## 2015-09-22 LAB — HEMOGLOBIN A1C
HEMOGLOBIN A1C: 5.5 % (ref 4.8–5.6)
Mean Plasma Glucose: 111 mg/dL

## 2015-09-22 MED ORDER — DEXTROSE 5 % IV SOLN
1.5000 g | INTRAVENOUS | Status: AC
  Administered 2015-09-23: 1.5 g via INTRAVENOUS
  Filled 2015-09-22: qty 1.5

## 2015-09-22 NOTE — Progress Notes (Signed)
[facility] PROGRESS NOTE  Tabitha Harris IHK:[id number] DOB: Sep 09, 1919 DOA: 09/20/2015 PCP: Merrilee Seashore, MD  Interim summary and HPI 82 y.o. female Who presented complaining of transient vision loss of right eye field which lasted for several minutes which occurred day prior to admission. The problem came on insidiously. Resolved on its own. Nothing she is aware of makes it better or worse. Patient presented to her ophthalmologist recommended coming to the ED for stroke workup.  Found to have TIA secondary to right ICA stenosis; vascular surgery consulted. Plan is for endarterectomy on 09/23/15 (hopefully)  Assessment/Plan: 1-transient right vision loss: appears to be TIA secondary to right ICA stenosis -no acute stroke appreciated on CT or MRI -normal ESR and CRP -normal lipid panel -A1C pending -patient will need treatment of right ICA stenosis and vascular surgery was consulted. Plan is for endarterectomy on 09/23/15 -cardiology has clear patient for surgery  2-Right ICA stenosis -as mentioned above, vascular surgery consulted and with plans for endarterectomy -will continue aspirin and statins   3-HTN: -stable overall -will continue current antihypertensive regimen   4-HLD: -will continue statins  5-hypothyroidism -continue synthroid  6-elevated intraocular pressure/glaucoma -continue xalatan and trusopt   7-GERD: -continue PPI  Code Status: Full Code Family Communication: no family at bedside  Disposition Plan: plan is for carotid endarterectomy on 09/23/15. Clear for surgery by cardiology.   Consultants:  Neurology  Vascular surgery   Cardiology service   Procedures:  2-D echo: pending  Carotid duplex: Carotid Duplex (Doppler) has been completed.  Findings suggest 60-79% right internal carotid artery stenosis by elevated end diastolic velocities, however peak systolic velocities and ICA/CCA ratio suggest 80-99% right internal carotid artery  stenosis.  Right vertebral artery is patent with antegrade flow.  Left internal carotid artery exhibits 40-59% stenosis by elevated velocities.  Left vertebral artery is patent with retrograde flow.  Antibiotics:  None   HPI/Subjective: Afebrile, no CP or SOB. Reports no further vision impairment or other neurological deficit. No acute distress and no other complaints.  Objective: Filed Vitals:   09/22/15 0934 09/22/15 1300  BP: 124/42 86/48  Pulse: 72 81  Temp: 98.6 F (37 C) 98.2 F (36.8 C)  Resp: 16 18    Intake/Output Summary (Last 24 hours) at 09/22/15 1658 Last data filed at 09/22/15 0935  Gross per 24 hour  Intake    600 ml  Output      0 ml  Net    600 ml   Filed Weights   09/20/15 1420 09/20/15 1944  Weight: 46.72 kg (103 lb) 46.72 kg (103 lb)    Exam:   General:  Afebrile, feeling ok and no complaining of CP, SOB or further vision problems. Patient in agreement to proceed with endarterectomy on 09/23/15  Cardiovascular: S1 and S2, no rubs or gallops   Respiratory: CTA bilaterally  Abdomen: soft, NT, ND, positive BS  Musculoskeletal: no edema or cyanosis   Data Reviewed: Basic Metabolic Panel:  Recent Labs Lab 09/20/15 1437 09/20/15 1447  NA 140 143  K 4.1 4.2  CL 106 104  CO2 27  --   GLUCOSE 101* 98  BUN 11 13  CREATININE 0.80 0.70  CALCIUM 9.5  --    Liver Function Tests:  Recent Labs Lab 09/20/15 1437  AST 27  ALT 22  ALKPHOS 47  BILITOT 1.2  PROT 6.7  ALBUMIN 3.9   CBC:  Recent Labs Lab 09/20/15 1437 09/20/15 1447  WBC 6.1  --  NEUTROABS 2.7  --   HGB 11.4* 11.6*  HCT 34.0* 34.0*  MCV 103.0*  --   PLT 302  --    Studies: Ct Angio Neck W/cm &/or Wo/cm  09/21/2015  CLINICAL DATA:  Symptomatic right carotid bifurcations stenosis. EXAM: CT ANGIOGRAPHY NECK TECHNIQUE: Multidetector CT imaging of the neck was performed using the standard protocol during bolus administration of intravenous contrast. Multiplanar CT  image reconstructions and MIPs were obtained to evaluate the vascular anatomy. Carotid stenosis measurements (when applicable) are obtained utilizing NASCET criteria, using the distal internal carotid diameter as the denominator. CONTRAST:  50 mL Isovue 370 COMPARISON:  MRI brain and MRA head 09/20/2015 FINDINGS: Aortic arch: A 3 vessel arch configuration is present. Dense calcifications are present at the origin of the left subclavian artery with a near occlusive stenosis. There are calcifications at the origins of the left common carotid artery and innominate artery is well without significant stenosis. Right carotid system: Atherosclerotic calcifications are present throughout the course of the right common carotid artery, more prominent distally. Dense calcifications are present at the carotid bifurcation. The lumen is narrowed to 1 mm. This compares with a more distal normal luminal measurement of 4.4 mm. There is also some irregularity of the mid cervical right ICA with a beaded appearance. Left carotid system: Atherosclerotic calcifications are present within the left common carotid artery distally without focal stenosis. Calcified and noncalcified plaque is present at the left carotid bifurcation without significant stenosis. There is some regularity of the distal left ICA compatible with fibromuscular dysplasia. Vertebral arteries:The min vertebral arteries originate from the subclavian arteries bilaterally. The left vertebral artery origin is just above the area of dense calcification. Calcifications are present in the proximal left vertebral artery at the level of C7. The right vertebral artery is slightly dominant to the left. Skeleton: Multilevel degenerative changes are present within the cervical spine. There is degenerative grade 1 retrolisthesis of 4 mm at C5-6 and slight anterolisthesis at C7-T1. Endplate degenerative changes and uncovertebral spurring is present throughout. Osseous foraminal  narrowing is most pronounced on the right at C5-6. No focal lytic or blastic lesions are present. Mild mucosal disease is present in the sphenoid sinuses bilaterally. Other neck: The thyroid is atrophic. No focal lytic or blastic lesions are present. The salivary glands are within normal limits bilaterally. No significant cervical adenopathy is present. The visualized lung apices are clear. There is some pleural parenchymal scarring. IMPRESSION: 1. Greater than 70% stenosis of the right internal carotid artery through the carotid bifurcation. 2. Diffuse atherosclerotic changes within the distal common carotid arteries and both carotid bifurcations without significant stenosis on the left. 3. High-grade, near occlusive, stenosis of the proximal left subclavian artery at the aortic arch. The left vertebral artery originates just be on the area calcification without significant stenosis. Of note, flow directionality cannot be determined on the basis of CT. 4. Noncalcified and vessel irregularity and beading in the mid cervical internal carotid arteries bilaterally suggesting fibromuscular dysplasia as well. 5. Multilevel spondylosis of the cervical spine with grade 1 retrolisthesis at C5-6 and slight anterolisthesis at C7-T1. Osseous foraminal narrowing is present on the right at C5-6. Electronically Signed   By: San Morelle M.D.   On: 09/21/2015 18:16   Mr Brain Wo Contrast  09/21/2015  CLINICAL DATA:  Initial evaluation for acute transient right-sided visual loss. EXAM: MRI HEAD WITHOUT CONTRAST MRA HEAD WITHOUT CONTRAST TECHNIQUE: Multiplanar, multiecho pulse sequences of the brain and surrounding structures were  obtained without intravenous contrast. Angiographic images of the head were obtained using MRA technique without contrast. COMPARISON:  Prior CT from earlier the same day FINDINGS: MRI HEAD FINDINGS Diffuse prominence of the CSF containing spaces is compatible with generalized age-related  cerebral atrophy. Patchy T2/FLAIR hyperintensity within the periventricular, deep, and subcortical white matter both cerebral hemispheres most consistent with chronic small vessel ischemic disease, mild for age. Remote lacunar infarct present within the ventral right thalamus. No remote cortical infarct. No abnormal foci of restricted diffusion to suggest acute infarct. Gray-white matter differentiation maintained. Major intracranial vascular flow voids are preserved. No acute or chronic intracranial hemorrhage. No mass lesion, midline shift, or mass effect. Ventricular prominence related to global parenchymal volume loss without hydrocephalus. No extra-axial fluid collection. Major dural sinuses are grossly patent. Craniocervical junction within normal limits. Degenerative disc bulge noted at C3-4 with resultant mild canal stenosis. Pituitary gland within normal limits. No acute abnormality about the orbits. Patient is status post bilateral cataract extraction. Scattered mucosal thickening within the paranasal sinuses, likely inflammatory. No air-fluid levels to suggest active sinus infection. No mastoid effusion. Inner ear structures grossly normal. Bone marrow signal intensity within normal limits. No scalp soft tissue abnormality. MRA HEAD FINDINGS ANTERIOR CIRCULATION: Distal cervical segments of the internal carotid arteries are widely patent. Petrous, cavernous, and supraclinoid segments patent without flow limiting stenosis A1 segments patent. Right A1 segment hypoplastic. Anterior communicating artery normal. Anterior cerebral arteries well opacified. M1 segments patent without stenosis or occlusion. MCA bifurcations normal. Distal MCA branches well opacified and symmetric. POSTERIOR CIRCULATION: Vertebral arteries patent to the vertebrobasilar junction. Posterior inferior cerebral arteries patent bilaterally. Basilar artery widely patent. Superior cerebellar arteries patent bilaterally. Both of the  posterior cerebral arteries arise from the basilar artery no well opacified to their distal aspects. IMPRESSION: MRI HEAD IMPRESSION: 1. No acute intracranial infarct or other process identified. 2. Remote lacunar infarct within the ventral right thalamus. 3. Generalized age-related cerebral atrophy with mild chronic microvascular ischemic disease. MRA HEAD IMPRESSION: Normal intracranial MRA. No large or proximal arterial branch occlusion. No high-grade or correctable stenosis. Electronically Signed   By: Jeannine Boga M.D.   On: 09/21/2015 00:07   Mr Jodene Nam Head/brain Wo Cm  09/21/2015  CLINICAL DATA:  Initial evaluation for acute transient right-sided visual loss. EXAM: MRI HEAD WITHOUT CONTRAST MRA HEAD WITHOUT CONTRAST TECHNIQUE: Multiplanar, multiecho pulse sequences of the brain and surrounding structures were obtained without intravenous contrast. Angiographic images of the head were obtained using MRA technique without contrast. COMPARISON:  Prior CT from earlier the same day FINDINGS: MRI HEAD FINDINGS Diffuse prominence of the CSF containing spaces is compatible with generalized age-related cerebral atrophy. Patchy T2/FLAIR hyperintensity within the periventricular, deep, and subcortical white matter both cerebral hemispheres most consistent with chronic small vessel ischemic disease, mild for age. Remote lacunar infarct present within the ventral right thalamus. No remote cortical infarct. No abnormal foci of restricted diffusion to suggest acute infarct. Gray-white matter differentiation maintained. Major intracranial vascular flow voids are preserved. No acute or chronic intracranial hemorrhage. No mass lesion, midline shift, or mass effect. Ventricular prominence related to global parenchymal volume loss without hydrocephalus. No extra-axial fluid collection. Major dural sinuses are grossly patent. Craniocervical junction within normal limits. Degenerative disc bulge noted at C3-4 with  resultant mild canal stenosis. Pituitary gland within normal limits. No acute abnormality about the orbits. Patient is status post bilateral cataract extraction. Scattered mucosal thickening within the paranasal sinuses, likely inflammatory. No air-fluid levels  to suggest active sinus infection. No mastoid effusion. Inner ear structures grossly normal. Bone marrow signal intensity within normal limits. No scalp soft tissue abnormality. MRA HEAD FINDINGS ANTERIOR CIRCULATION: Distal cervical segments of the internal carotid arteries are widely patent. Petrous, cavernous, and supraclinoid segments patent without flow limiting stenosis A1 segments patent. Right A1 segment hypoplastic. Anterior communicating artery normal. Anterior cerebral arteries well opacified. M1 segments patent without stenosis or occlusion. MCA bifurcations normal. Distal MCA branches well opacified and symmetric. POSTERIOR CIRCULATION: Vertebral arteries patent to the vertebrobasilar junction. Posterior inferior cerebral arteries patent bilaterally. Basilar artery widely patent. Superior cerebellar arteries patent bilaterally. Both of the posterior cerebral arteries arise from the basilar artery no well opacified to their distal aspects. IMPRESSION: MRI HEAD IMPRESSION: 1. No acute intracranial infarct or other process identified. 2. Remote lacunar infarct within the ventral right thalamus. 3. Generalized age-related cerebral atrophy with mild chronic microvascular ischemic disease. MRA HEAD IMPRESSION: Normal intracranial MRA. No large or proximal arterial branch occlusion. No high-grade or correctable stenosis. Electronically Signed   By: Jeannine Boga M.D.   On: 09/21/2015 00:07    Scheduled Meds: . amLODipine  5 mg Oral Daily  . aspirin  81 mg Oral Daily  . atorvastatin  20 mg Oral q1800  . calcium carbonate  1 tablet Oral BID WC  . cefUROXime (ZINACEF)  IV  1.5 g Intravenous To OR  . dorzolamide  1 drop Both Eyes BID  .  heparin  5,000 Units Subcutaneous Q8H  . latanoprost  1 drop Both Eyes QHS  . levothyroxine  75 mcg Oral QAC breakfast  . multivitamin with minerals  1 tablet Oral Daily  . omega-3 acid ethyl esters  2 g Oral Daily  . pantoprazole  40 mg Oral Daily   Continuous Infusions:   Active Problems:   TIA (transient ischemic attack)   Stroke-like symptom   Carotid stenosis   Preoperative cardiovascular examination   HLD (hyperlipidemia)   Essential hypertension   Esophageal reflux    Time spent: 25 minutes    Barton Dubois  [facility] Pager [phone]. If 7PM-7AM, please contact night-coverage at [email or web address], password [facility] 09/22/2015, 4:58 PM  LOS: 2 days

## 2015-09-22 NOTE — Progress Notes (Signed)
Plan is for patient to have carotid endarterectomy tomorrow. Patient is from home alone without support at discharge. CM met with the patient to go over plan at discharge. Patient is hoping to be able to go to rehab at discharge. In the event that patient does not meet the 3 night minimum for Medicare, patient states she has [employer] long term health coverage that could assist with private duty sitters at home. During our meeting patient able to call her daughter in [place] and she is going to be arriving Sunday to assist with needs at discharge. Per patient daughter is only going to be able to stay a few days. CM will provide the patient with a list of private duty agencies and the list of SNF rehabs in the [place] area. CM will continue to follow for d/c needs.

## 2015-09-22 NOTE — Progress Notes (Signed)
Patient ID: Tabitha Harris, female   DOB: 1919-07-30, 82 y.o.   MRN: [id number] Vascular Surgery Progress Note  Subjective: Status post right brain TIA with 3 minutes of blindness right eye with severe right ICA stenosis. Patient has had no recurrent symptoms since in the hospital. She was on aspirin daily prior to this event. She has no history of stroke. She would like to proceed with right carotid endarterectomy tomorrow. Cardiac evaluation has deemed her a low to moderate risk based on age only. She has no cardiac symptoms.  Objective:  Filed Vitals:   09/22/15 0532 09/22/15 0934  BP: 103/69 124/42  Pulse: 63 72  Temp: 98.6 F (37 C) 98.6 F (37 C)  Resp: 18 16    Gen. alert and oriented 3 no apparent distress Neurologic exam remains normal   Labs:  Recent Labs Lab 09/20/15 1437 09/20/15 1447  CREATININE 0.80 0.70    Recent Labs Lab 09/20/15 1437 09/20/15 1447  NA 140 143  K 4.1 4.2  CL 106 104  CO2 27  --   BUN 11 13  CREATININE 0.80 0.70  GLUCOSE 101* 98  CALCIUM 9.5  --     Recent Labs Lab 09/20/15 1437 09/20/15 1447  WBC 6.1  --   HGB 11.4* 11.6*  HCT 34.0* 34.0*  PLT 302  --     Recent Labs Lab 09/20/15 1437  INR 1.07    I/O last 3 completed shifts: In: 900 [P.O.:900] Out: -   Imaging: Ct Head Wo Contrast  09/20/2015  CLINICAL DATA:  82 year old with amaurosis fugax involving the right eye on Saturday with loss of vision for approximately 5 min. EXAM: CT HEAD WITHOUT CONTRAST TECHNIQUE: Contiguous axial images were obtained from the base of the skull through the vertex without intravenous contrast. COMPARISON:  None. FINDINGS: Moderate to severe cortical and deep atrophy related to patient age. Mild cerebellar atrophy. Moderate to severe changes of small vessel disease of the white matter diffusely, including the midbrain and pons. Physiologic calcification in the right basal ganglia. No mass lesion. No midline shift. No acute hemorrhage or  hematoma. No extra-axial fluid collections. No evidence of acute infarction. No skull fracture or other focal osseous abnormality involving the skull. Mucosal thickening involving the sphenoid sinuses and scattered bilateral ethmoid air cells. Remaining visualized paranasal sinuses, bilateral mastoid air cells and bilateral middle ear cavities well-aerated. Extensive bilateral carotid siphon atherosclerosis. IMPRESSION: 1. No acute intracranial abnormality. 2. Moderate to severe generalized atrophy and chronic microvascular ischemic changes of the white matter. 3. Mild chronic bilateral ethmoid and sphenoid sinus disease. Electronically Signed   By: Evangeline Dakin M.D.   On: 09/20/2015 15:52   Ct Angio Neck W/cm &/or Wo/cm  09/21/2015  CLINICAL DATA:  Symptomatic right carotid bifurcations stenosis. EXAM: CT ANGIOGRAPHY NECK TECHNIQUE: Multidetector CT imaging of the neck was performed using the standard protocol during bolus administration of intravenous contrast. Multiplanar CT image reconstructions and MIPs were obtained to evaluate the vascular anatomy. Carotid stenosis measurements (when applicable) are obtained utilizing NASCET criteria, using the distal internal carotid diameter as the denominator. CONTRAST:  50 mL Isovue 370 COMPARISON:  MRI brain and MRA head 09/20/2015 FINDINGS: Aortic arch: A 3 vessel arch configuration is present. Dense calcifications are present at the origin of the left subclavian artery with a near occlusive stenosis. There are calcifications at the origins of the left common carotid artery and innominate artery is well without significant stenosis. Right carotid system: Atherosclerotic calcifications  are present throughout the course of the right common carotid artery, more prominent distally. Dense calcifications are present at the carotid bifurcation. The lumen is narrowed to 1 mm. This compares with a more distal normal luminal measurement of 4.4 mm. There is also some  irregularity of the mid cervical right ICA with a beaded appearance. Left carotid system: Atherosclerotic calcifications are present within the left common carotid artery distally without focal stenosis. Calcified and noncalcified plaque is present at the left carotid bifurcation without significant stenosis. There is some regularity of the distal left ICA compatible with fibromuscular dysplasia. Vertebral arteries:The min vertebral arteries originate from the subclavian arteries bilaterally. The left vertebral artery origin is just above the area of dense calcification. Calcifications are present in the proximal left vertebral artery at the level of C7. The right vertebral artery is slightly dominant to the left. Skeleton: Multilevel degenerative changes are present within the cervical spine. There is degenerative grade 1 retrolisthesis of 4 mm at C5-6 and slight anterolisthesis at C7-T1. Endplate degenerative changes and uncovertebral spurring is present throughout. Osseous foraminal narrowing is most pronounced on the right at C5-6. No focal lytic or blastic lesions are present. Mild mucosal disease is present in the sphenoid sinuses bilaterally. Other neck: The thyroid is atrophic. No focal lytic or blastic lesions are present. The salivary glands are within normal limits bilaterally. No significant cervical adenopathy is present. The visualized lung apices are clear. There is some pleural parenchymal scarring. IMPRESSION: 1. Greater than 70% stenosis of the right internal carotid artery through the carotid bifurcation. 2. Diffuse atherosclerotic changes within the distal common carotid arteries and both carotid bifurcations without significant stenosis on the left. 3. High-grade, near occlusive, stenosis of the proximal left subclavian artery at the aortic arch. The left vertebral artery originates just be on the area calcification without significant stenosis. Of note, flow directionality cannot be determined  on the basis of CT. 4. Noncalcified and vessel irregularity and beading in the mid cervical internal carotid arteries bilaterally suggesting fibromuscular dysplasia as well. 5. Multilevel spondylosis of the cervical spine with grade 1 retrolisthesis at C5-6 and slight anterolisthesis at C7-T1. Osseous foraminal narrowing is present on the right at C5-6. Electronically Signed   By: San Morelle M.D.   On: 09/21/2015 18:16   Mr Brain Wo Contrast  09/21/2015  CLINICAL DATA:  Initial evaluation for acute transient right-sided visual loss. EXAM: MRI HEAD WITHOUT CONTRAST MRA HEAD WITHOUT CONTRAST TECHNIQUE: Multiplanar, multiecho pulse sequences of the brain and surrounding structures were obtained without intravenous contrast. Angiographic images of the head were obtained using MRA technique without contrast. COMPARISON:  Prior CT from earlier the same day FINDINGS: MRI HEAD FINDINGS Diffuse prominence of the CSF containing spaces is compatible with generalized age-related cerebral atrophy. Patchy T2/FLAIR hyperintensity within the periventricular, deep, and subcortical white matter both cerebral hemispheres most consistent with chronic small vessel ischemic disease, mild for age. Remote lacunar infarct present within the ventral right thalamus. No remote cortical infarct. No abnormal foci of restricted diffusion to suggest acute infarct. Gray-white matter differentiation maintained. Major intracranial vascular flow voids are preserved. No acute or chronic intracranial hemorrhage. No mass lesion, midline shift, or mass effect. Ventricular prominence related to global parenchymal volume loss without hydrocephalus. No extra-axial fluid collection. Major dural sinuses are grossly patent. Craniocervical junction within normal limits. Degenerative disc bulge noted at C3-4 with resultant mild canal stenosis. Pituitary gland within normal limits. No acute abnormality about the orbits. Patient is status  post  bilateral cataract extraction. Scattered mucosal thickening within the paranasal sinuses, likely inflammatory. No air-fluid levels to suggest active sinus infection. No mastoid effusion. Inner ear structures grossly normal. Bone marrow signal intensity within normal limits. No scalp soft tissue abnormality. MRA HEAD FINDINGS ANTERIOR CIRCULATION: Distal cervical segments of the internal carotid arteries are widely patent. Petrous, cavernous, and supraclinoid segments patent without flow limiting stenosis A1 segments patent. Right A1 segment hypoplastic. Anterior communicating artery normal. Anterior cerebral arteries well opacified. M1 segments patent without stenosis or occlusion. MCA bifurcations normal. Distal MCA branches well opacified and symmetric. POSTERIOR CIRCULATION: Vertebral arteries patent to the vertebrobasilar junction. Posterior inferior cerebral arteries patent bilaterally. Basilar artery widely patent. Superior cerebellar arteries patent bilaterally. Both of the posterior cerebral arteries arise from the basilar artery no well opacified to their distal aspects. IMPRESSION: MRI HEAD IMPRESSION: 1. No acute intracranial infarct or other process identified. 2. Remote lacunar infarct within the ventral right thalamus. 3. Generalized age-related cerebral atrophy with mild chronic microvascular ischemic disease. MRA HEAD IMPRESSION: Normal intracranial MRA. No large or proximal arterial branch occlusion. No high-grade or correctable stenosis. Electronically Signed   By: Jeannine Boga M.D.   On: 09/21/2015 00:07   Mr Jodene Nam Head/brain Wo Cm  09/21/2015  CLINICAL DATA:  Initial evaluation for acute transient right-sided visual loss. EXAM: MRI HEAD WITHOUT CONTRAST MRA HEAD WITHOUT CONTRAST TECHNIQUE: Multiplanar, multiecho pulse sequences of the brain and surrounding structures were obtained without intravenous contrast. Angiographic images of the head were obtained using MRA technique without  contrast. COMPARISON:  Prior CT from earlier the same day FINDINGS: MRI HEAD FINDINGS Diffuse prominence of the CSF containing spaces is compatible with generalized age-related cerebral atrophy. Patchy T2/FLAIR hyperintensity within the periventricular, deep, and subcortical white matter both cerebral hemispheres most consistent with chronic small vessel ischemic disease, mild for age. Remote lacunar infarct present within the ventral right thalamus. No remote cortical infarct. No abnormal foci of restricted diffusion to suggest acute infarct. Gray-white matter differentiation maintained. Major intracranial vascular flow voids are preserved. No acute or chronic intracranial hemorrhage. No mass lesion, midline shift, or mass effect. Ventricular prominence related to global parenchymal volume loss without hydrocephalus. No extra-axial fluid collection. Major dural sinuses are grossly patent. Craniocervical junction within normal limits. Degenerative disc bulge noted at C3-4 with resultant mild canal stenosis. Pituitary gland within normal limits. No acute abnormality about the orbits. Patient is status post bilateral cataract extraction. Scattered mucosal thickening within the paranasal sinuses, likely inflammatory. No air-fluid levels to suggest active sinus infection. No mastoid effusion. Inner ear structures grossly normal. Bone marrow signal intensity within normal limits. No scalp soft tissue abnormality. MRA HEAD FINDINGS ANTERIOR CIRCULATION: Distal cervical segments of the internal carotid arteries are widely patent. Petrous, cavernous, and supraclinoid segments patent without flow limiting stenosis A1 segments patent. Right A1 segment hypoplastic. Anterior communicating artery normal. Anterior cerebral arteries well opacified. M1 segments patent without stenosis or occlusion. MCA bifurcations normal. Distal MCA branches well opacified and symmetric. POSTERIOR CIRCULATION: Vertebral arteries patent to the  vertebrobasilar junction. Posterior inferior cerebral arteries patent bilaterally. Basilar artery widely patent. Superior cerebellar arteries patent bilaterally. Both of the posterior cerebral arteries arise from the basilar artery no well opacified to their distal aspects. IMPRESSION: MRI HEAD IMPRESSION: 1. No acute intracranial infarct or other process identified. 2. Remote lacunar infarct within the ventral right thalamus. 3. Generalized age-related cerebral atrophy with mild chronic microvascular ischemic disease. MRA HEAD IMPRESSION: Normal intracranial MRA. No  large or proximal arterial branch occlusion. No high-grade or correctable stenosis. Electronically Signed   By: Jeannine Boga M.D.   On: 09/21/2015 00:07    Assessment/Plan:    LOS: 2 days  s/p Procedure(s): ENDARTERECTOMY CAROTID  Plan right carotid endarterectomy tomorrow for prevention of stroke in this very active independent 82 year old female Risks and benefits thoroughly discussed She has having some difficulty arranging family members for her post hospital care and will have social services evaluate her for possible short term SNF following surgery if needed Plan surgery in a.m.   Tinnie Gens, MD 09/22/2015 11:12 AM

## 2015-09-22 NOTE — Progress Notes (Signed)
SLP Cancellation Note  Patient Details Name: Tabitha Harris MRN: [id number] DOB: 1919/05/14   Cancelled treatment:       Reason Eval/Treat Not Completed: SLP screened, no needs identified, will sign off.    Germain Osgood, M.A. CCC-SLP [phone]  Germain Osgood 09/22/2015, 3:53 PM

## 2015-09-22 NOTE — Anesthesia Preprocedure Evaluation (Addendum)
Anesthesia Evaluation  Patient identified by MRN, date of birth, ID band Patient awake    Reviewed: Allergy & Precautions, NPO status , Patient's Chart, lab work & pertinent test results  History of Anesthesia Complications Negative for: history of anesthetic complications  Airway Mallampati: I  TM Distance: >3 FB Neck ROM: Full    Dental  (+) Dental Advisory Given   Pulmonary neg pulmonary ROS,    breath sounds clear to auscultation       Cardiovascular hypertension, Pt. on medications (-) angina+ Peripheral Vascular Disease   Rhythm:Regular Rate:Normal  09/21/15 ECHO: EF [id number], grade 2 diastolic dysfunction   Neuro/Psych TIA   GI/Hepatic Neg liver ROS, GERD  Controlled and Medicated,  Endo/Other  Hypothyroidism   Renal/GU negative Renal ROS     Musculoskeletal   Abdominal   Peds  Hematology negative hematology ROS (+)   Anesthesia Other Findings H/o breast cancer  Reproductive/Obstetrics                            Anesthesia Physical Anesthesia Plan  ASA: III  Anesthesia Plan: General   Post-op Pain Management:    Induction: Intravenous  Airway Management Planned: Oral ETT  Additional Equipment:   Intra-op Plan:   Post-operative Plan: Extubation in OR  Informed Consent: I have reviewed the patients History and Physical, chart, labs and discussed the procedure including the risks, benefits and alternatives for the proposed anesthesia with the patient or authorized representative who has indicated his/her understanding and acceptance.   Dental advisory given  Plan Discussed with: CRNA and Surgeon  Anesthesia Plan Comments: (Plan routine monitors, GETA)        Anesthesia Quick Evaluation

## 2015-09-22 NOTE — Progress Notes (Signed)
STROKE TEAM PROGRESS NOTE   SUBJECTIVE (INTERVAL HISTORY) She is stable from neurological standpoint. Carotid ultrasound and CT angiograms suggest significant proximal right ICA stenosis which is symptomatic   OBJECTIVE Temp:  [98.2 F (36.8 C)-98.9 F (37.2 C)] 98.2 F (36.8 C) (06/01 1300) Pulse Rate:  [56-83] 81 (06/01 1300) Cardiac Rhythm:  [-] Normal sinus rhythm (06/01 0743) Resp:  [16-18] 18 (06/01 1300) BP: (86-135)/(42-69) 86/48 mmHg (06/01 1300) SpO2:  [93 %-98 %] 98 % (06/01 1300)  CBC:   Recent Labs Lab 09/20/15 1437 09/20/15 1447  WBC 6.1  --   NEUTROABS 2.7  --   HGB 11.4* 11.6*  HCT 34.0* 34.0*  MCV 103.0*  --   PLT 302  --    Basic Metabolic Panel:   Recent Labs Lab 09/20/15 1437 09/20/15 1447  NA 140 143  K 4.1 4.2  CL 106 104  CO2 27  --   GLUCOSE 101* 98  BUN 11 13  CREATININE 0.80 0.70  CALCIUM 9.5  --     Lipid Panel:     Component Value Date/Time   CHOL 136 09/21/2015 0519   TRIG 59 09/21/2015 0519   HDL 60 09/21/2015 0519   CHOLHDL 2.3 09/21/2015 0519   VLDL 12 09/21/2015 0519   LDLCALC 64 09/21/2015 0519   HgbA1c:  Lab Results  Component Value Date   HGBA1C 5.5 09/21/2015   Urine Drug Screen: No results found for: LABOPIA, COCAINSCRNUR, LABBENZ, AMPHETMU, THCU, LABBARB   IMAGING  Ct Head Wo Contrast  09/20/2015  CLINICAL DATA:  82 year old with amaurosis fugax involving the right eye on Saturday with loss of vision for approximately 5 min. EXAM: CT HEAD WITHOUT CONTRAST TECHNIQUE: Contiguous axial images were obtained from the base of the skull through the vertex without intravenous contrast. COMPARISON:  None. FINDINGS: Moderate to severe cortical and deep atrophy related to 80. Mild cerebellar atrophy. Moderate to severe changes of small vessel disease of the white matter diffusely, including the midbrain and pons. Physiologic calcification in the right basal ganglia. No mass lesion. No midline shift. No acute  hemorrhage or hematoma. No extra-axial fluid collections. No evidence of acute infarction. No skull fracture or other focal osseous abnormality involving the skull. Mucosal thickening involving the sphenoid sinuses and scattered bilateral ethmoid air cells. Remaining visualized paranasal sinuses, bilateral mastoid air cells and bilateral middle ear cavities well-aerated. Extensive bilateral carotid siphon atherosclerosis. IMPRESSION: 1. No acute intracranial abnormality. 2. Moderate to severe generalized atrophy and chronic microvascular ischemic changes of the white matter. 3. Mild chronic bilateral ethmoid and sphenoid sinus disease. Electronically Signed   By: Evangeline Dakin M.D.   On: 09/20/2015 15:52   Ct Angio Neck W/cm &/or Wo/cm  09/21/2015  CLINICAL DATA:  Symptomatic right carotid bifurcations stenosis. EXAM: CT ANGIOGRAPHY NECK TECHNIQUE: Multidetector CT imaging of the neck was performed using the standard protocol during bolus administration of intravenous contrast. Multiplanar CT image reconstructions and MIPs were obtained to evaluate the vascular anatomy. Carotid stenosis measurements (when applicable) are obtained utilizing NASCET criteria, using the distal internal carotid diameter as the denominator. CONTRAST:  50 mL Isovue 370 COMPARISON:  MRI brain and MRA head 09/20/2015 FINDINGS: Aortic arch: A 3 vessel arch configuration is present. Dense calcifications are present at the origin of the left subclavian artery with a near occlusive stenosis. There are calcifications at the origins of the left common carotid artery and innominate artery is well without significant stenosis. Right carotid system: Atherosclerotic calcifications  are present throughout the course of the right common carotid artery, more prominent distally. Dense calcifications are present at the carotid bifurcation. The lumen is narrowed to 1 mm. This compares with a more distal normal luminal measurement of 4.4 mm. There is  also some irregularity of the mid cervical right ICA with a beaded appearance. Left carotid system: Atherosclerotic calcifications are present within the left common carotid artery distally without focal stenosis. Calcified and noncalcified plaque is present at the left carotid bifurcation without significant stenosis. There is some regularity of the distal left ICA compatible with fibromuscular dysplasia. Vertebral arteries:The min vertebral arteries originate from the subclavian arteries bilaterally. The left vertebral artery origin is just above the area of dense calcification. Calcifications are present in the proximal left vertebral artery at the level of C7. The right vertebral artery is slightly dominant to the left. Skeleton: Multilevel degenerative changes are present within the cervical spine. There is degenerative grade 1 retrolisthesis of 4 mm at C5-6 and slight anterolisthesis at C7-T1. Endplate degenerative changes and uncovertebral spurring is present throughout. Osseous foraminal narrowing is most pronounced on the right at C5-6. No focal lytic or blastic lesions are present. Mild mucosal disease is present in the sphenoid sinuses bilaterally. Other neck: The thyroid is atrophic. No focal lytic or blastic lesions are present. The salivary glands are within normal limits bilaterally. No significant cervical adenopathy is present. The visualized lung apices are clear. There is some pleural parenchymal scarring. IMPRESSION: 1. Greater than 70% stenosis of the right internal carotid artery through the carotid bifurcation. 2. Diffuse atherosclerotic changes within the distal common carotid arteries and both carotid bifurcations without significant stenosis on the left. 3. High-grade, near occlusive, stenosis of the proximal left subclavian artery at the aortic arch. The left vertebral artery originates just be on the area calcification without significant stenosis. Of note, flow directionality cannot be  determined on the basis of CT. 4. Noncalcified and vessel irregularity and beading in the mid cervical internal carotid arteries bilaterally suggesting fibromuscular dysplasia as well. 5. Multilevel spondylosis of the cervical spine with grade 1 retrolisthesis at C5-6 and slight anterolisthesis at C7-T1. Osseous foraminal narrowing is present on the right at C5-6. Electronically Signed   By: San Morelle M.D.   On: 09/21/2015 18:16   Mr Brain Wo Contrast  09/21/2015  CLINICAL DATA:  Initial evaluation for acute transient right-sided visual loss. EXAM: MRI HEAD WITHOUT CONTRAST MRA HEAD WITHOUT CONTRAST TECHNIQUE: Multiplanar, multiecho pulse sequences of the brain and surrounding structures were obtained without intravenous contrast. Angiographic images of the head were obtained using MRA technique without contrast. COMPARISON:  Prior CT from earlier the same day FINDINGS: MRI HEAD FINDINGS Diffuse prominence of the CSF containing spaces is compatible with generalized age-related cerebral atrophy. Patchy T2/FLAIR hyperintensity within the periventricular, deep, and subcortical white matter both cerebral hemispheres most consistent with chronic small vessel ischemic disease, mild for age. Remote lacunar infarct present within the ventral right thalamus. No remote cortical infarct. No abnormal foci of restricted diffusion to suggest acute infarct. Gray-white matter differentiation maintained. Major intracranial vascular flow voids are preserved. No acute or chronic intracranial hemorrhage. No mass lesion, midline shift, or mass effect. Ventricular prominence related to global parenchymal volume loss without hydrocephalus. No extra-axial fluid collection. Major dural sinuses are grossly patent. Craniocervical junction within normal limits. Degenerative disc bulge noted at C3-4 with resultant mild canal stenosis. Pituitary gland within normal limits. No acute abnormality about the orbits. Patient is status  post bilateral cataract extraction. Scattered mucosal thickening within the paranasal sinuses, likely inflammatory. No air-fluid levels to suggest active sinus infection. No mastoid effusion. Inner ear structures grossly normal. Bone marrow signal intensity within normal limits. No scalp soft tissue abnormality. MRA HEAD FINDINGS ANTERIOR CIRCULATION: Distal cervical segments of the internal carotid arteries are widely patent. Petrous, cavernous, and supraclinoid segments patent without flow limiting stenosis A1 segments patent. Right A1 segment hypoplastic. Anterior communicating artery normal. Anterior cerebral arteries well opacified. M1 segments patent without stenosis or occlusion. MCA bifurcations normal. Distal MCA branches well opacified and symmetric. POSTERIOR CIRCULATION: Vertebral arteries patent to the vertebrobasilar junction. Posterior inferior cerebral arteries patent bilaterally. Basilar artery widely patent. Superior cerebellar arteries patent bilaterally. Both of the posterior cerebral arteries arise from the basilar artery no well opacified to their distal aspects. IMPRESSION: MRI HEAD IMPRESSION: 1. No acute intracranial infarct or other process identified. 2. Remote lacunar infarct within the ventral right thalamus. 3. Generalized age-related cerebral atrophy with mild chronic microvascular ischemic disease. MRA HEAD IMPRESSION: Normal intracranial MRA. No large or proximal arterial branch occlusion. No high-grade or correctable stenosis. Electronically Signed   By: Jeannine Boga M.D.   On: 09/21/2015 00:07   Mr Jodene Nam Head/brain Wo Cm  09/21/2015  CLINICAL DATA:  Initial evaluation for acute transient right-sided visual loss. EXAM: MRI HEAD WITHOUT CONTRAST MRA HEAD WITHOUT CONTRAST TECHNIQUE: Multiplanar, multiecho pulse sequences of the brain and surrounding structures were obtained without intravenous contrast. Angiographic images of the head were obtained using MRA technique without  contrast. COMPARISON:  Prior CT from earlier the same day FINDINGS: MRI HEAD FINDINGS Diffuse prominence of the CSF containing spaces is compatible with generalized age-related cerebral atrophy. Patchy T2/FLAIR hyperintensity within the periventricular, deep, and subcortical white matter both cerebral hemispheres most consistent with chronic small vessel ischemic disease, mild for age. Remote lacunar infarct present within the ventral right thalamus. No remote cortical infarct. No abnormal foci of restricted diffusion to suggest acute infarct. Gray-white matter differentiation maintained. Major intracranial vascular flow voids are preserved. No acute or chronic intracranial hemorrhage. No mass lesion, midline shift, or mass effect. Ventricular prominence related to global parenchymal volume loss without hydrocephalus. No extra-axial fluid collection. Major dural sinuses are grossly patent. Craniocervical junction within normal limits. Degenerative disc bulge noted at C3-4 with resultant mild canal stenosis. Pituitary gland within normal limits. No acute abnormality about the orbits. Patient is status post bilateral cataract extraction. Scattered mucosal thickening within the paranasal sinuses, likely inflammatory. No air-fluid levels to suggest active sinus infection. No mastoid effusion. Inner ear structures grossly normal. Bone marrow signal intensity within normal limits. No scalp soft tissue abnormality. MRA HEAD FINDINGS ANTERIOR CIRCULATION: Distal cervical segments of the internal carotid arteries are widely patent. Petrous, cavernous, and supraclinoid segments patent without flow limiting stenosis A1 segments patent. Right A1 segment hypoplastic. Anterior communicating artery normal. Anterior cerebral arteries well opacified. M1 segments patent without stenosis or occlusion. MCA bifurcations normal. Distal MCA branches well opacified and symmetric. POSTERIOR CIRCULATION: Vertebral arteries patent to the  vertebrobasilar junction. Posterior inferior cerebral arteries patent bilaterally. Basilar artery widely patent. Superior cerebellar arteries patent bilaterally. Both of the posterior cerebral arteries arise from the basilar artery no well opacified to their distal aspects. IMPRESSION: MRI HEAD IMPRESSION: 1. No acute intracranial infarct or other process identified. 2. Remote lacunar infarct within the ventral right thalamus. 3. Generalized age-related cerebral atrophy with mild chronic microvascular ischemic disease. MRA HEAD IMPRESSION: Normal intracranial MRA. No large  or proximal arterial branch occlusion. No high-grade or correctable stenosis. Electronically Signed   By: Jeannine Boga M.D.   On: 09/21/2015 00:07   Carotid Doppler   Findings suggest 60-79% right internal carotid artery stenosis by elevated end diastolic velocities, however peak systolic velocities and ICA/CCA ratio suggest 80-99% right internal carotid artery stenosis. Right vertebral artery is patent with antegrade flow. Left internal carotid artery exhibits 40-59% stenosis by elevated velocities. Left vertebral artery is patent with retrograde flow.  2D Echocardiogram  - Left ventricle: The cavity size was normal. There was mild focal basal hypertrophy of the septum. Systolic function was vigorous. The estimated ejection fraction was in the range of 65% to 70%. Wall motion was normal; there were no regional wall motion abnormalities. Features are consistent with a pseudonormal left ventricular filling pattern, with concomitant abnormal relaxation and increased filling pressure (grade 2 diastolic dysfunction). - Aortic valve: Severely calcified annulus. Trileaflet. Mild diffuse thickening and calcification, consistent with sclerosis. - Mitral valve: Calcified annulus. - Pulmonary arteries: PA peak pressure: 33 mm Hg (S).   PHYSICAL EXAM GENERAL- alert, co-operative, appears as stated age, not in any distress, sitting in  recliner at bedside. HEENT- Atraumatic, normocephalic, appears to have a cardiac murmur transmitting to her carotids.  CARDIAC- RRR, 3/6 systolic murmurs, no rubs or gallops. RESP- Moving equal volumes of air, and no wheezes or crackles. ABDOMEN- Soft, non tender. NEURO- awakeAlert, oriented x 3, Visual fields intact, EOMI,fundi not visualized. Vision acuity seems adequate. Normal hearing and facial strength.  Motor system exam shows a normal  , strength in  upper and lower extremities- 5/5, finger to nose test normal bilat, rapid alternating movement- intact. EXTREMITIES- warm, moving extremities spontaneously.   ASSESSMENT/PLAN Ms. Tabitha Harris is a 82 y.o. female with history of HTN, HLD, L breast cancer, thyroid disease and GERD presenting with R eye vision loss thought to be amaurosis fugax in setting of negative ESR & CRP. She did not receive IV t-PA due to resolved symptoms.    amaurosis fugax secondary to right carotid artery stenosis  MRI  No acute stroke. Old R thalamic lacune. Atrophy. small vessel disease.  MRA  normal  Carotid Doppler  R 60-79% stenosis, L 40-59%. L VA flow retrograde  2D Echo  EF 65-70%. No source of embolus   LDL 64  HgbA1c 5.5  Heparin 5000 units sq tid for VTE prophylaxis Diet Heart Room service appropriate?: Yes; Fluid consistency:: Thin Diet NPO time specified Except for: Sips with Meds  No antithrombotic prior to admission, now on aspirin 81 mg daily  Patient counseled to be compliant with her antithrombotic medications  Vascular surgical eval- for CEA in am  Ongoing aggressive stroke risk factor management  Therapy recommendations:  No PT follow up  Disposition:  Home  Carotid Artery Stenosis  Aspirin, Statin, HTN control  Vascular Surg eval.  Hypertension  Stable  Hyperlipidemia  Home meds:  lipitor 20 and fish oil, statin resumed in hospital  LDL 64, goal < 70  Continue statin at discharge  Other Stroke Risk  Factors  Advanced age  ETOH use  Other Active Problems  Thyroid disease on synthroid.  Hospital day # 2  I have personally examined this patient, reviewed notes, independently viewed imaging studies, participated in medical decision making and plan of care. I have made any additions or clarifications directly to the above note.  Patient is scheduled for elective carotid surgery tomorrow. No new recommendations today. Antony Contras, MD  Medical Director [facility] Pager: [phone] 09/22/2015 5:16 PM

## 2015-09-23 ENCOUNTER — Encounter (HOSPITAL_COMMUNITY): Payer: Self-pay | Admitting: Certified Registered Nurse Anesthetist

## 2015-09-23 ENCOUNTER — Encounter (HOSPITAL_COMMUNITY)
Admission: EM | Disposition: A | Discharge status: Home Health | Payer: Self-pay | Source: Home / Self Care | Attending: Internal Medicine

## 2015-09-23 ENCOUNTER — Inpatient Hospital Stay (HOSPITAL_COMMUNITY): Payer: Medicare Other | Admitting: Certified Registered Nurse Anesthetist

## 2015-09-23 HISTORY — PX: ENDARTERECTOMY: SHX5162

## 2015-09-23 LAB — ABO/RH: ABO/RH(D): A POS

## 2015-09-23 LAB — MRSA PCR SCREENING: MRSA by PCR: NEGATIVE

## 2015-09-23 LAB — TYPE AND SCREEN
ABO/RH(D): A POS
Antibody Screen: NEGATIVE

## 2015-09-23 SURGERY — ENDARTERECTOMY, CAROTID
Anesthesia: General | Laterality: Right

## 2015-09-23 MED ORDER — LACTATED RINGERS IV SOLN
INTRAVENOUS | Status: DC | PRN
  Administered 2015-09-23: 07:00:00 via INTRAVENOUS

## 2015-09-23 MED ORDER — MORPHINE SULFATE (PF) 2 MG/ML IV SOLN
1.0000 mg | INTRAVENOUS | Status: DC | PRN
  Administered 2015-09-23 – 2015-09-24 (×4): 1 mg via INTRAVENOUS
  Filled 2015-09-23 (×4): qty 1

## 2015-09-23 MED ORDER — 0.9 % SODIUM CHLORIDE (POUR BTL) OPTIME
TOPICAL | Status: DC | PRN
  Administered 2015-09-23: 2000 mL

## 2015-09-23 MED ORDER — TRAMADOL HCL 50 MG PO TABS: 50.0000 mg | ORAL_TABLET | Freq: Three times a day (TID) | ORAL | Status: DC | PRN

## 2015-09-23 MED ORDER — SODIUM CHLORIDE 0.9 % IV SOLN
INTRAVENOUS | Status: DC
  Administered 2015-09-23: 10:00:00 via INTRAVENOUS

## 2015-09-23 MED ORDER — SODIUM CHLORIDE 0.9 % IV SOLN
500.0000 mL | Freq: Once | INTRAVENOUS | Status: AC | PRN
  Administered 2015-09-23: 500 mL via INTRAVENOUS

## 2015-09-23 MED ORDER — FENTANYL CITRATE (PF) 250 MCG/5ML IJ SOLN
INTRAMUSCULAR | Status: AC
  Filled 2015-09-23: qty 5

## 2015-09-23 MED ORDER — PROPOFOL 10 MG/ML IV BOLUS
INTRAVENOUS | Status: AC
  Filled 2015-09-23: qty 40

## 2015-09-23 MED ORDER — GUAIFENESIN-DM 100-10 MG/5ML PO SYRP: 15.0000 mL | ORAL_SOLUTION | ORAL | Status: DC | PRN

## 2015-09-23 MED ORDER — ONDANSETRON HCL 4 MG/2ML IJ SOLN
INTRAMUSCULAR | Status: DC | PRN
  Administered 2015-09-23: 4 mg via INTRAVENOUS

## 2015-09-23 MED ORDER — MIDAZOLAM HCL 2 MG/2ML IJ SOLN: 0.5000 mg | Freq: Once | INTRAMUSCULAR | Status: DC | PRN

## 2015-09-23 MED ORDER — FENTANYL CITRATE (PF) 100 MCG/2ML IJ SOLN
INTRAMUSCULAR | Status: DC | PRN
  Administered 2015-09-23: 50 ug via INTRAVENOUS

## 2015-09-23 MED ORDER — FENTANYL CITRATE (PF) 100 MCG/2ML IJ SOLN
25.0000 ug | INTRAMUSCULAR | Status: DC | PRN
  Administered 2015-09-23 (×3): 25 ug via INTRAVENOUS
  Administered 2015-09-23: 50 ug via INTRAVENOUS

## 2015-09-23 MED ORDER — PROPOFOL 10 MG/ML IV BOLUS
INTRAVENOUS | Status: DC | PRN
  Administered 2015-09-23: 80 mg via INTRAVENOUS

## 2015-09-23 MED ORDER — PHENOL 1.4 % MT LIQD
1.0000 | OROMUCOSAL | Status: DC | PRN
  Administered 2015-09-23: 1 via OROMUCOSAL
  Filled 2015-09-23: qty 177

## 2015-09-23 MED ORDER — GLYCOPYRROLATE 0.2 MG/ML IV SOSY
PREFILLED_SYRINGE | INTRAVENOUS | Status: DC | PRN
  Administered 2015-09-23: .2 mg via INTRAVENOUS

## 2015-09-23 MED ORDER — ACETAMINOPHEN 325 MG RE SUPP: 325.0000 mg | RECTAL | Status: DC | PRN

## 2015-09-23 MED ORDER — POTASSIUM CHLORIDE CRYS ER 20 MEQ PO TBCR: 20.0000 meq | EXTENDED_RELEASE_TABLET | Freq: Every day | ORAL | Status: DC | PRN

## 2015-09-23 MED ORDER — SODIUM CHLORIDE 0.9 % IV SOLN
0.0125 ug/kg/min | INTRAVENOUS | Status: AC
  Administered 2015-09-23: .1 ug/kg/min via INTRAVENOUS
  Filled 2015-09-23: qty 2000

## 2015-09-23 MED ORDER — ONDANSETRON HCL 4 MG/2ML IJ SOLN: 4.0000 mg | Freq: Four times a day (QID) | INTRAMUSCULAR | Status: DC | PRN

## 2015-09-23 MED ORDER — DOCUSATE SODIUM 100 MG PO CAPS
100.0000 mg | ORAL_CAPSULE | Freq: Every day | ORAL | Status: DC
  Administered 2015-09-24 – 2015-09-25 (×2): 100 mg via ORAL
  Filled 2015-09-23 (×2): qty 1

## 2015-09-23 MED ORDER — FENTANYL CITRATE (PF) 100 MCG/2ML IJ SOLN
INTRAMUSCULAR | Status: AC
  Administered 2015-09-23: 50 ug via INTRAVENOUS
  Filled 2015-09-23: qty 2

## 2015-09-23 MED ORDER — PANTOPRAZOLE SODIUM 40 MG PO TBEC: 40.0000 mg | DELAYED_RELEASE_TABLET | Freq: Every day | ORAL | Status: DC

## 2015-09-23 MED ORDER — LACTATED RINGERS IV SOLN
INTRAVENOUS | Status: DC | PRN
  Administered 2015-09-23: 06:00:00 via INTRAVENOUS

## 2015-09-23 MED ORDER — METOPROLOL TARTRATE 5 MG/5ML IV SOLN
2.0000 mg | INTRAVENOUS | Status: DC | PRN
Start: 2015-09-23 — End: 2015-09-25

## 2015-09-23 MED ORDER — HEPARIN SODIUM (PORCINE) 1000 UNIT/ML IJ SOLN
INTRAMUSCULAR | Status: DC | PRN
  Administered 2015-09-23: 6000 [IU] via INTRAVENOUS

## 2015-09-23 MED ORDER — SUGAMMADEX SODIUM 200 MG/2ML IV SOLN
INTRAVENOUS | Status: DC | PRN
  Administered 2015-09-23: 93.4 mg via INTRAVENOUS

## 2015-09-23 MED ORDER — DEXTROSE 5 % IV SOLN
10.0000 mg | INTRAVENOUS | Status: DC | PRN
Start: 2015-09-23 — End: 2015-09-23
  Administered 2015-09-23: 25 ug/min via INTRAVENOUS

## 2015-09-23 MED ORDER — LIDOCAINE HCL (PF) 1 % IJ SOLN: INTRAMUSCULAR | Status: DC | PRN

## 2015-09-23 MED ORDER — LABETALOL HCL 5 MG/ML IV SOLN: 10.0000 mg | INTRAVENOUS | Status: DC | PRN

## 2015-09-23 MED ORDER — MEPERIDINE HCL 25 MG/ML IJ SOLN: 6.2500 mg | INTRAMUSCULAR | Status: DC | PRN

## 2015-09-23 MED ORDER — EPHEDRINE SULFATE-NACL 50-0.9 MG/10ML-% IV SOSY
PREFILLED_SYRINGE | INTRAVENOUS | Status: DC | PRN
  Administered 2015-09-23: 10 mg via INTRAVENOUS

## 2015-09-23 MED ORDER — PROTAMINE SULFATE 10 MG/ML IV SOLN
INTRAVENOUS | Status: DC | PRN
  Administered 2015-09-23 (×5): 10 mg via INTRAVENOUS

## 2015-09-23 MED ORDER — SODIUM CHLORIDE 0.9 % IV SOLN
INTRAVENOUS | Status: DC | PRN
  Administered 2015-09-23: 08:00:00

## 2015-09-23 MED ORDER — DEXTROSE 5 % IV SOLN
1.5000 g | Freq: Two times a day (BID) | INTRAVENOUS | Status: AC
  Administered 2015-09-23 – 2015-09-24 (×2): 1.5 g via INTRAVENOUS
  Filled 2015-09-23 (×2): qty 1.5

## 2015-09-23 MED ORDER — LIDOCAINE HCL (PF) 1 % IJ SOLN
INTRAMUSCULAR | Status: AC
  Filled 2015-09-23: qty 30

## 2015-09-23 MED ORDER — PROMETHAZINE HCL 25 MG/ML IJ SOLN: 6.2500 mg | INTRAMUSCULAR | Status: DC | PRN

## 2015-09-23 MED ORDER — HYDRALAZINE HCL 20 MG/ML IJ SOLN: 5.0000 mg | INTRAMUSCULAR | Status: DC | PRN

## 2015-09-23 MED ORDER — FENTANYL CITRATE (PF) 100 MCG/2ML IJ SOLN
INTRAMUSCULAR | Status: AC
  Administered 2015-09-23: 25 ug via INTRAVENOUS
  Filled 2015-09-23: qty 2

## 2015-09-23 MED ORDER — ALUM & MAG HYDROXIDE-SIMETH 200-200-20 MG/5ML PO SUSP: 15.0000 mL | ORAL | Status: DC | PRN

## 2015-09-23 MED ORDER — LIDOCAINE 2% (20 MG/ML) 5 ML SYRINGE
INTRAMUSCULAR | Status: DC | PRN
  Administered 2015-09-23: 20 mg via INTRAVENOUS

## 2015-09-23 MED ORDER — HEPARIN SODIUM (PORCINE) 1000 UNIT/ML IJ SOLN: INTRAMUSCULAR | Status: DC | PRN

## 2015-09-23 MED ORDER — VECURONIUM BROMIDE 10 MG IV SOLR
INTRAVENOUS | Status: DC | PRN
  Administered 2015-09-23: 6 mg via INTRAVENOUS

## 2015-09-23 MED ORDER — ACETAMINOPHEN 325 MG PO TABS
325.0000 mg | ORAL_TABLET | ORAL | Status: DC | PRN
  Administered 2015-09-24: 650 mg via ORAL

## 2015-09-23 SURGICAL SUPPLY — 43 items
CANISTER SUCTION 2500CC (MISCELLANEOUS) ×3 IMPLANT
CATH ROBINSON RED A/P 18FR (CATHETERS) ×3 IMPLANT
CATH SUCT 10FR WHISTLE TIP (CATHETERS) ×3 IMPLANT
CLIP TI MEDIUM 24 (CLIP) ×3 IMPLANT
CLIP TI WIDE RED SMALL 24 (CLIP) ×3 IMPLANT
CRADLE DONUT ADULT HEAD (MISCELLANEOUS) ×3 IMPLANT
DECANTER SPIKE VIAL GLASS SM (MISCELLANEOUS) IMPLANT
DRAIN HEMOVAC 1/8 X 5 (WOUND CARE) IMPLANT
DRAPE WARM FLUID 44X44 (DRAPE) ×3 IMPLANT
DRSG COVADERM 4X6 (GAUZE/BANDAGES/DRESSINGS) ×3 IMPLANT
DRSG COVADERM 4X8 (GAUZE/BANDAGES/DRESSINGS) ×3 IMPLANT
ELECT REM PT RETURN 9FT ADLT (ELECTROSURGICAL) ×3
ELECTRODE REM PT RTRN 9FT ADLT (ELECTROSURGICAL) ×1 IMPLANT
EVACUATOR SILICONE 100CC (DRAIN) IMPLANT
GAUZE SPONGE 4X4 12PLY STRL (GAUZE/BANDAGES/DRESSINGS) ×3 IMPLANT
GLOVE SS BIOGEL STRL SZ 7 (GLOVE) ×1 IMPLANT
GLOVE SUPERSENSE BIOGEL SZ 7 (GLOVE) ×2
GOWN STRL REUS W/ TWL LRG LVL3 (GOWN DISPOSABLE) ×3 IMPLANT
GOWN STRL REUS W/TWL LRG LVL3 (GOWN DISPOSABLE) ×6
INSERT FOGARTY SM (MISCELLANEOUS) ×3 IMPLANT
KIT BASIN OR (CUSTOM PROCEDURE TRAY) ×3 IMPLANT
KIT ROOM TURNOVER OR (KITS) ×3 IMPLANT
NEEDLE 22X1 1/2 (OR ONLY) (NEEDLE) IMPLANT
NEEDLE HYPO 25GX1X1/2 BEV (NEEDLE) ×3 IMPLANT
NS IRRIG 1000ML POUR BTL (IV SOLUTION) ×6 IMPLANT
PACK CAROTID (CUSTOM PROCEDURE TRAY) ×3 IMPLANT
PAD ARMBOARD 7.5X6 YLW CONV (MISCELLANEOUS) ×6 IMPLANT
PATCH HEMASHIELD 8X75 (Vascular Products) ×3 IMPLANT
SHUNT CAROTID BYPASS 12FRX15.5 (VASCULAR PRODUCTS) IMPLANT
SPONGE INTESTINAL PEANUT (DISPOSABLE) ×6 IMPLANT
SUT PROLENE 6 0 C 1 24 (SUTURE) ×3 IMPLANT
SUT PROLENE 6 0 C 1 30 (SUTURE) ×3 IMPLANT
SUT PROLENE 6 0 CC (SUTURE) ×6 IMPLANT
SUT SILK 2 0 FS (SUTURE) ×3 IMPLANT
SUT SILK 3 0 TIES 17X18 (SUTURE)
SUT SILK 3-0 18XBRD TIE BLK (SUTURE) IMPLANT
SUT VIC AB 2-0 CT1 27 (SUTURE) ×2
SUT VIC AB 2-0 CT1 TAPERPNT 27 (SUTURE) ×1 IMPLANT
SUT VIC AB 3-0 X1 27 (SUTURE) ×3 IMPLANT
SYR CONTROL 10ML LL (SYRINGE) IMPLANT
SYR TB 1ML LUER SLIP (SYRINGE) ×3 IMPLANT
TAPE UMBILICAL COTTON 1/8X30 (MISCELLANEOUS) ×3 IMPLANT
WATER STERILE IRR 1000ML POUR (IV SOLUTION) ×3 IMPLANT

## 2015-09-23 NOTE — Progress Notes (Signed)
[facility] PROGRESS NOTE  Tabitha Harris YIR:[id number] DOB: 10-May-1919 DOA: 09/20/2015 PCP: Merrilee Seashore, MD  Interim summary and HPI 82 y.o. female Who presented complaining of transient vision loss of right eye field which lasted for several minutes which occurred day prior to admission. The problem came on insidiously. Resolved on its own. Nothing she is aware of makes it better or worse. Patient presented to her ophthalmologist recommended coming to the ED for stroke workup.  Found to have TIA secondary to right ICA stenosis; vascular surgery consulted. Plan is for endarterectomy later today 6/2/1. Will follow recovery and discharge plans base on clinical response/needs  Assessment/Plan: 1-transient right vision loss: appears to be TIA secondary to right ICA stenosis -no acute stroke appreciated on CT or MRI -normal ESR and CRP -normal lipid panel -A1C 5.5 -patient will need treatment of right ICA stenosis and vascular surgery was consulted. Plan is for endarterectomy on 09/23/15 -cardiology has clear patient for surgery  2-Right ICA stenosis -as mentioned above, vascular surgery consulted and with plans for endarterectomy -will continue aspirin and statins   3-HTN: -stable overall -will continue current antihypertensive regimen   4-HLD: -will continue statins  5-hypothyroidism -continue synthroid  6-elevated intraocular pressure/glaucoma -continue xalatan and trusopt   7-GERD: -continue PPI  Code Status: Full Code Family Communication: no family at bedside  Disposition Plan: plan is for carotid endarterectomy later today 09/23/15. Clear for surgery by cardiology.   Consultants:  Neurology  Vascular surgery   Cardiology service   Procedures:  2-D echo: pending  Carotid duplex: Carotid Duplex (Doppler) has been completed.  Findings suggest 60-79% right internal carotid artery stenosis by elevated end diastolic velocities, however peak systolic  velocities and ICA/CCA ratio suggest 80-99% right internal carotid artery stenosis.  Right vertebral artery is patent with antegrade flow.  Left internal carotid artery exhibits 40-59% stenosis by elevated velocities.  Left vertebral artery is patent with retrograde flow.  Antibiotics:  None   HPI/Subjective: Afebrile, no CP or SOB. Reports no neurological deficit. Scared from upcoming surgery    Objective: Filed Vitals:   09/23/15 1520 09/23/15 1700  BP: 95/58 108/67  Pulse: 89 77  Temp: 97.8 F (36.6 C)   Resp: 19 15    Intake/Output Summary (Last 24 hours) at 09/23/15 1757 Last data filed at 09/23/15 1100  Gross per 24 hour  Intake    950 ml  Output    220 ml  Net    730 ml   Filed Weights   09/20/15 1420 09/20/15 1944 09/23/15 1520  Weight: 46.72 kg (103 lb) 46.72 kg (103 lb) 50.1 kg (110 lb 7.2 oz)    Exam:   General:  Afebrile, feeling ok and no complaining of CP, SOB or further vision problems. Slightly scared from upcoming surgery.  Cardiovascular: S1 and S2, no rubs or gallops   Respiratory: CTA bilaterally  Abdomen: soft, NT, ND, positive BS  Musculoskeletal: no edema or cyanosis   Data Reviewed: Basic Metabolic Panel:  Recent Labs Lab 09/20/15 1437 09/20/15 1447  NA 140 143  K 4.1 4.2  CL 106 104  CO2 27  --   GLUCOSE 101* 98  BUN 11 13  CREATININE 0.80 0.70  CALCIUM 9.5  --    Liver Function Tests:  Recent Labs Lab 09/20/15 1437  AST 27  ALT 22  ALKPHOS 47  BILITOT 1.2  PROT 6.7  ALBUMIN 3.9   CBC:  Recent Labs Lab 09/20/15 1437 09/20/15 1447  WBC 6.1  --  NEUTROABS 2.7  --   HGB 11.4* 11.6*  HCT 34.0* 34.0*  MCV 103.0*  --   PLT 302  --    Studies: No results found.  Scheduled Meds: . amLODipine  5 mg Oral Daily  . aspirin  81 mg Oral Daily  . atorvastatin  20 mg Oral q1800  . calcium carbonate  1 tablet Oral BID WC  . cefUROXime (ZINACEF)  IV  1.5 g Intravenous Q12H  . [START ON 09/24/2015] docusate  sodium  100 mg Oral Daily  . dorzolamide  1 drop Both Eyes BID  . latanoprost  1 drop Both Eyes QHS  . levothyroxine  75 mcg Oral QAC breakfast  . multivitamin with minerals  1 tablet Oral Daily  . omega-3 acid ethyl esters  2 g Oral Daily  . pantoprazole  40 mg Oral Daily   Continuous Infusions: . sodium chloride 50 mL/hr at 09/23/15 1000    Active Problems:   TIA (transient ischemic attack)   Stroke-like symptom   Carotid stenosis   Preoperative cardiovascular examination   HLD (hyperlipidemia)   Essential hypertension   Esophageal reflux    Time spent: 25 minutes    Barton Dubois  [facility] Pager [phone]. If 7PM-7AM, please contact night-coverage at [email or web address], password [facility] 09/23/2015, 5:57 PM  LOS: 3 days

## 2015-09-23 NOTE — Progress Notes (Signed)
  Day of Surgery Note    Subjective:  No complaints  Filed Vitals:   09/23/15 0113 09/23/15 0456  BP: 88/36 121/79  Pulse: 70 67  Temp: 98.2 F (36.8 C) 97.7 F (36.5 C)  Resp: 16 16    Incisions:   Bandage is dry Extremities:  Moving all extremities equally Cardiac:  regular Lungs:  Non labored Neuro:  In tact; tongue is midline  Assessment/Plan:  This is a 82 y.o. female who is s/p right carotid endarterectomy  -pt doing well and is neurologically in tact -to [place] when bed available   Leontine Locket, PA-C 09/23/2015 10:15 AM

## 2015-09-23 NOTE — Progress Notes (Signed)
Patients telemetry order expired over 24 hours ago she is going to OR at 0615 for right carotid endarterectomy if telemetry is needed post surgery a new order will be needed.

## 2015-09-23 NOTE — Anesthesia Postprocedure Evaluation (Signed)
Anesthesia Post Note  Patient: Tabitha Harris  Procedure(s) Performed: Procedure(s) (LRB): ENDARTERECTOMY CAROTID WITH DACRON PATCH ANGIOPLASTY (Right)  Patient location during evaluation: PACU Anesthesia Type: General Level of consciousness: awake and alert, oriented and patient cooperative Pain management: pain level controlled Vital Signs Assessment: post-procedure vital signs reviewed and stable Respiratory status: spontaneous breathing, nonlabored ventilation, respiratory function stable and patient connected to nasal cannula oxygen Cardiovascular status: blood pressure returned to baseline and stable Postop Assessment: no signs of nausea or vomiting Anesthetic complications: no    Last Vitals:  Filed Vitals:   09/23/15 1230 09/23/15 1245  BP: 96/51 89/53  Pulse: 78 75  Temp:    Resp: 15 13    Last Pain:  Filed Vitals:   09/23/15 1251  PainSc: 8                  Kumar Falwell,E. Odies Desa

## 2015-09-23 NOTE — Op Note (Signed)
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 Following induction of satisfactory general endotracheal anesthesia the right neck was prepped and draped in a routine sterile manner. Incision was made on the anterior border of the sternocleidomastoid muscle and carried down through the subcutaneous tissue and platysma using the Bovie. Care was taken not to injure the hypoglossal nerve.. The common internal and external carotid arteries were dissected free. There was a calcified atherosclerotic plaque at the carotid bifurcation extending up the internal carotid artery. The plaque also extended down the common carotid artery to just above the clavicle at the crossing of the omohyoid muscle.. A #10 shunt was then prepared and the patient was heparinized. The carotid vessels were occluded with vascular clamps. A longitudinal opening was made in the common carotid with a 15 blade extended up the internal carotid with the Potts scissors to a point distal to the disease. The plaque was approximately 95 % stenotic in severity. The distal vessel appeared normal. Shunt was inserted without difficulty reestablishing flow in about 2 minutes. A standard endarterectomy was performed with an eversion endarterectomy of the external carotid. The plaque feathered off  the distal internal carotid artery nicely not requiring any tacking sutures. The lumen was thoroughly irrigated with heparinized saline and loose debris all  carefully removed. The common carotid artery was closed primarily with continuous 6-0 Prolene since this was a long arthrotomy and it was closed up to just below the bifurcation. Dacron patch was then sewn as a patch angioplasty onto the internal carotid down to the common carotid artery area and. Prior to completion of the  Closure the  shunt was removed after approximately 50  minutes of shunt time. Flow was then reestablished up the external branch initially followed by the internal branch. Protamine was given to her reverse the heparin.Following adequate hemostasis the wound was irrigated with saline and closed in layers with Vicryl ain a subcuticular fashion. Sterile dressing was applied and the patient taken to the recovery room in stable condition.  Tinnie Gens, MD 09/23/2015 9:44 AM

## 2015-09-23 NOTE — H&P (View-Only) (Signed)
Patient ID: Tabitha Harris, female   DOB: 1919/11/10, 82 y.o.   MRN: [id number] Vascular Surgery Progress Note  Subjective: Status post right brain TIA with 3 minutes of blindness right eye with severe right ICA stenosis. Patient has had no recurrent symptoms since in the hospital. She was on aspirin daily prior to this event. She has no history of stroke. She would like to proceed with right carotid endarterectomy tomorrow. Cardiac evaluation has deemed her a low to moderate risk based on age only. She has no cardiac symptoms.  Objective:  Filed Vitals:   09/22/15 0532 09/22/15 0934  BP: 103/69 124/42  Pulse: 63 72  Temp: 98.6 F (37 C) 98.6 F (37 C)  Resp: 18 16    Gen. alert and oriented 3 no apparent distress Neurologic exam remains normal   Labs:  Recent Labs Lab 09/20/15 1437 09/20/15 1447  CREATININE 0.80 0.70    Recent Labs Lab 09/20/15 1437 09/20/15 1447  NA 140 143  K 4.1 4.2  CL 106 104  CO2 27  --   BUN 11 13  CREATININE 0.80 0.70  GLUCOSE 101* 98  CALCIUM 9.5  --     Recent Labs Lab 09/20/15 1437 09/20/15 1447  WBC 6.1  --   HGB 11.4* 11.6*  HCT 34.0* 34.0*  PLT 302  --     Recent Labs Lab 09/20/15 1437  INR 1.07    I/O last 3 completed shifts: In: 900 [P.O.:900] Out: -   Imaging: Ct Head Wo Contrast  09/20/2015  CLINICAL DATA:  82 year old with amaurosis fugax involving the right eye on Saturday with loss of vision for approximately 5 min. EXAM: CT HEAD WITHOUT CONTRAST TECHNIQUE: Contiguous axial images were obtained from the base of the skull through the vertex without intravenous contrast. COMPARISON:  None. FINDINGS: Moderate to severe cortical and deep atrophy related to patient age. Mild cerebellar atrophy. Moderate to severe changes of small vessel disease of the white matter diffusely, including the midbrain and pons. Physiologic calcification in the right basal ganglia. No mass lesion. No midline shift. No acute hemorrhage or  hematoma. No extra-axial fluid collections. No evidence of acute infarction. No skull fracture or other focal osseous abnormality involving the skull. Mucosal thickening involving the sphenoid sinuses and scattered bilateral ethmoid air cells. Remaining visualized paranasal sinuses, bilateral mastoid air cells and bilateral middle ear cavities well-aerated. Extensive bilateral carotid siphon atherosclerosis. IMPRESSION: 1. No acute intracranial abnormality. 2. Moderate to severe generalized atrophy and chronic microvascular ischemic changes of the white matter. 3. Mild chronic bilateral ethmoid and sphenoid sinus disease. Electronically Signed   By: Evangeline Dakin M.D.   On: 09/20/2015 15:52   Ct Angio Neck W/cm &/or Wo/cm  09/21/2015  CLINICAL DATA:  Symptomatic right carotid bifurcations stenosis. EXAM: CT ANGIOGRAPHY NECK TECHNIQUE: Multidetector CT imaging of the neck was performed using the standard protocol during bolus administration of intravenous contrast. Multiplanar CT image reconstructions and MIPs were obtained to evaluate the vascular anatomy. Carotid stenosis measurements (when applicable) are obtained utilizing NASCET criteria, using the distal internal carotid diameter as the denominator. CONTRAST:  50 mL Isovue 370 COMPARISON:  MRI brain and MRA head 09/20/2015 FINDINGS: Aortic arch: A 3 vessel arch configuration is present. Dense calcifications are present at the origin of the left subclavian artery with a near occlusive stenosis. There are calcifications at the origins of the left common carotid artery and innominate artery is well without significant stenosis. Right carotid system: Atherosclerotic calcifications  are present throughout the course of the right common carotid artery, more prominent distally. Dense calcifications are present at the carotid bifurcation. The lumen is narrowed to 1 mm. This compares with a more distal normal luminal measurement of 4.4 mm. There is also some  irregularity of the mid cervical right ICA with a beaded appearance. Left carotid system: Atherosclerotic calcifications are present within the left common carotid artery distally without focal stenosis. Calcified and noncalcified plaque is present at the left carotid bifurcation without significant stenosis. There is some regularity of the distal left ICA compatible with fibromuscular dysplasia. Vertebral arteries:The min vertebral arteries originate from the subclavian arteries bilaterally. The left vertebral artery origin is just above the area of dense calcification. Calcifications are present in the proximal left vertebral artery at the level of C7. The right vertebral artery is slightly dominant to the left. Skeleton: Multilevel degenerative changes are present within the cervical spine. There is degenerative grade 1 retrolisthesis of 4 mm at C5-6 and slight anterolisthesis at C7-T1. Endplate degenerative changes and uncovertebral spurring is present throughout. Osseous foraminal narrowing is most pronounced on the right at C5-6. No focal lytic or blastic lesions are present. Mild mucosal disease is present in the sphenoid sinuses bilaterally. Other neck: The thyroid is atrophic. No focal lytic or blastic lesions are present. The salivary glands are within normal limits bilaterally. No significant cervical adenopathy is present. The visualized lung apices are clear. There is some pleural parenchymal scarring. IMPRESSION: 1. Greater than 70% stenosis of the right internal carotid artery through the carotid bifurcation. 2. Diffuse atherosclerotic changes within the distal common carotid arteries and both carotid bifurcations without significant stenosis on the left. 3. High-grade, near occlusive, stenosis of the proximal left subclavian artery at the aortic arch. The left vertebral artery originates just be on the area calcification without significant stenosis. Of note, flow directionality cannot be determined  on the basis of CT. 4. Noncalcified and vessel irregularity and beading in the mid cervical internal carotid arteries bilaterally suggesting fibromuscular dysplasia as well. 5. Multilevel spondylosis of the cervical spine with grade 1 retrolisthesis at C5-6 and slight anterolisthesis at C7-T1. Osseous foraminal narrowing is present on the right at C5-6. Electronically Signed   By: San Morelle M.D.   On: 09/21/2015 18:16   Mr Brain Wo Contrast  09/21/2015  CLINICAL DATA:  Initial evaluation for acute transient right-sided visual loss. EXAM: MRI HEAD WITHOUT CONTRAST MRA HEAD WITHOUT CONTRAST TECHNIQUE: Multiplanar, multiecho pulse sequences of the brain and surrounding structures were obtained without intravenous contrast. Angiographic images of the head were obtained using MRA technique without contrast. COMPARISON:  Prior CT from earlier the same day FINDINGS: MRI HEAD FINDINGS Diffuse prominence of the CSF containing spaces is compatible with generalized age-related cerebral atrophy. Patchy T2/FLAIR hyperintensity within the periventricular, deep, and subcortical white matter both cerebral hemispheres most consistent with chronic small vessel ischemic disease, mild for age. Remote lacunar infarct present within the ventral right thalamus. No remote cortical infarct. No abnormal foci of restricted diffusion to suggest acute infarct. Gray-white matter differentiation maintained. Major intracranial vascular flow voids are preserved. No acute or chronic intracranial hemorrhage. No mass lesion, midline shift, or mass effect. Ventricular prominence related to global parenchymal volume loss without hydrocephalus. No extra-axial fluid collection. Major dural sinuses are grossly patent. Craniocervical junction within normal limits. Degenerative disc bulge noted at C3-4 with resultant mild canal stenosis. Pituitary gland within normal limits. No acute abnormality about the orbits. Patient is status  post  bilateral cataract extraction. Scattered mucosal thickening within the paranasal sinuses, likely inflammatory. No air-fluid levels to suggest active sinus infection. No mastoid effusion. Inner ear structures grossly normal. Bone marrow signal intensity within normal limits. No scalp soft tissue abnormality. MRA HEAD FINDINGS ANTERIOR CIRCULATION: Distal cervical segments of the internal carotid arteries are widely patent. Petrous, cavernous, and supraclinoid segments patent without flow limiting stenosis A1 segments patent. Right A1 segment hypoplastic. Anterior communicating artery normal. Anterior cerebral arteries well opacified. M1 segments patent without stenosis or occlusion. MCA bifurcations normal. Distal MCA branches well opacified and symmetric. POSTERIOR CIRCULATION: Vertebral arteries patent to the vertebrobasilar junction. Posterior inferior cerebral arteries patent bilaterally. Basilar artery widely patent. Superior cerebellar arteries patent bilaterally. Both of the posterior cerebral arteries arise from the basilar artery no well opacified to their distal aspects. IMPRESSION: MRI HEAD IMPRESSION: 1. No acute intracranial infarct or other process identified. 2. Remote lacunar infarct within the ventral right thalamus. 3. Generalized age-related cerebral atrophy with mild chronic microvascular ischemic disease. MRA HEAD IMPRESSION: Normal intracranial MRA. No large or proximal arterial branch occlusion. No high-grade or correctable stenosis. Electronically Signed   By: Jeannine Boga M.D.   On: 09/21/2015 00:07   Mr Jodene Nam Head/brain Wo Cm  09/21/2015  CLINICAL DATA:  Initial evaluation for acute transient right-sided visual loss. EXAM: MRI HEAD WITHOUT CONTRAST MRA HEAD WITHOUT CONTRAST TECHNIQUE: Multiplanar, multiecho pulse sequences of the brain and surrounding structures were obtained without intravenous contrast. Angiographic images of the head were obtained using MRA technique without  contrast. COMPARISON:  Prior CT from earlier the same day FINDINGS: MRI HEAD FINDINGS Diffuse prominence of the CSF containing spaces is compatible with generalized age-related cerebral atrophy. Patchy T2/FLAIR hyperintensity within the periventricular, deep, and subcortical white matter both cerebral hemispheres most consistent with chronic small vessel ischemic disease, mild for age. Remote lacunar infarct present within the ventral right thalamus. No remote cortical infarct. No abnormal foci of restricted diffusion to suggest acute infarct. Gray-white matter differentiation maintained. Major intracranial vascular flow voids are preserved. No acute or chronic intracranial hemorrhage. No mass lesion, midline shift, or mass effect. Ventricular prominence related to global parenchymal volume loss without hydrocephalus. No extra-axial fluid collection. Major dural sinuses are grossly patent. Craniocervical junction within normal limits. Degenerative disc bulge noted at C3-4 with resultant mild canal stenosis. Pituitary gland within normal limits. No acute abnormality about the orbits. Patient is status post bilateral cataract extraction. Scattered mucosal thickening within the paranasal sinuses, likely inflammatory. No air-fluid levels to suggest active sinus infection. No mastoid effusion. Inner ear structures grossly normal. Bone marrow signal intensity within normal limits. No scalp soft tissue abnormality. MRA HEAD FINDINGS ANTERIOR CIRCULATION: Distal cervical segments of the internal carotid arteries are widely patent. Petrous, cavernous, and supraclinoid segments patent without flow limiting stenosis A1 segments patent. Right A1 segment hypoplastic. Anterior communicating artery normal. Anterior cerebral arteries well opacified. M1 segments patent without stenosis or occlusion. MCA bifurcations normal. Distal MCA branches well opacified and symmetric. POSTERIOR CIRCULATION: Vertebral arteries patent to the  vertebrobasilar junction. Posterior inferior cerebral arteries patent bilaterally. Basilar artery widely patent. Superior cerebellar arteries patent bilaterally. Both of the posterior cerebral arteries arise from the basilar artery no well opacified to their distal aspects. IMPRESSION: MRI HEAD IMPRESSION: 1. No acute intracranial infarct or other process identified. 2. Remote lacunar infarct within the ventral right thalamus. 3. Generalized age-related cerebral atrophy with mild chronic microvascular ischemic disease. MRA HEAD IMPRESSION: Normal intracranial MRA. No  large or proximal arterial branch occlusion. No high-grade or correctable stenosis. Electronically Signed   By: Jeannine Boga M.D.   On: 09/21/2015 00:07    Assessment/Plan:    LOS: 2 days  s/p Procedure(s): ENDARTERECTOMY CAROTID  Plan right carotid endarterectomy tomorrow for prevention of stroke in this very active independent 82 year old female Risks and benefits thoroughly discussed She has having some difficulty arranging family members for her post hospital care and will have social services evaluate her for possible short term SNF following surgery if needed Plan surgery in a.m.   Tinnie Gens, MD 09/22/2015 11:12 AM

## 2015-09-23 NOTE — Care Management Note (Signed)
Case Management Note  Patient Details  Name: Tabitha Harris MRN: [id number] Date of Birth: April 13, 1920  Subjective/Objective:  Patient is from home alone, s/p right carotid endarterectomy, NCM will cont to follow for dc needs.                  Action/Plan:   Expected Discharge Date:                  Expected Discharge Plan:  [place]  In-House Referral:     Discharge planning Services  CM Consult  Post Acute Care Choice:    Choice offered to:     DME Arranged:    DME Agency:     HH Arranged:    [name] Agency:     Status of Service:  In process, will continue to follow  Medicare Important Message Given:    Date Medicare IM Given:    Medicare IM give by:    Date Additional Medicare IM Given:    Additional Medicare Important Message give by:     If discussed at [place] of Stay Meetings, dates discussed:    Additional Comments:  Zenon Mayo, RN 09/23/2015, 5:36 PM

## 2015-09-23 NOTE — Progress Notes (Signed)
Patient leaving floor for right carotid endarterectomy do expect her to return to this floor baring any complications.

## 2015-09-23 NOTE — Anesthesia Procedure Notes (Signed)
Procedure Name: Intubation Date/Time: 09/23/2015 7:52 AM Performed by: Bethel Born Pre-anesthesia Checklist: Patient identified, Emergency Drugs available, Suction available, Patient being monitored and Timeout performed Patient Re-evaluated:Patient Re-evaluated prior to inductionOxygen Delivery Method: Circle system utilized Preoxygenation: Pre-oxygenation with 100% oxygen Intubation Type: IV induction Ventilation: Mask ventilation without difficulty Laryngoscope Size: Miller and 2 Grade View: Grade I Tube type: Oral Tube size: 7.0 mm Number of attempts: 1 Airway Equipment and Method: Stylet Secured at: 23 cm Tube secured with: Tape Dental Injury: Teeth and Oropharynx as per pre-operative assessment

## 2015-09-23 NOTE — Interval H&P Note (Signed)
History and Physical Interval Note:  09/23/2015 7:26 AM  Tabitha Harris  has presented today for surgery, with the diagnosis of Right carotid artery stenosis I65.21  The various methods of treatment have been discussed with the patient and family. After consideration of risks, benefits and other options for treatment, the patient has consented to  Procedure(s): ENDARTERECTOMY CAROTID (Right) as a surgical intervention .  The patient's history has been reviewed, patient examined, no change in status, stable for surgery.  I have reviewed the patient's chart and labs.  Questions were answered to the patient's satisfaction.     Tinnie Gens

## 2015-09-23 NOTE — Transfer of Care (Signed)
Immediate Anesthesia Transfer of Care Note  Patient: Tabitha Harris  Procedure(s) Performed: Procedure(s): ENDARTERECTOMY CAROTID WITH DACRON PATCH ANGIOPLASTY (Right)  Patient Location: PACU  Anesthesia Type:General  Level of Consciousness: awake, alert  and oriented  Airway & Oxygen Therapy: Patient Spontanous Breathing and Patient connected to nasal cannula oxygen  Post-op Assessment: Report given to RN and Post -op Vital signs reviewed and stable  Post vital signs: Reviewed and stable  Last Vitals:  Filed Vitals:   09/23/15 0113 09/23/15 0456  BP: 88/36 121/79  Pulse: 70 67  Temp: 36.8 C 36.5 C  Resp: 16 16    Last Pain:  Filed Vitals:   09/23/15 0456  PainSc: 0-No pain         Complications: No apparent anesthesia complications

## 2015-09-24 DIAGNOSIS — J3801 Paralysis of vocal cords and larynx, unilateral: Secondary | ICD-10-CM | POA: Insufficient documentation

## 2015-09-24 LAB — CBC
HCT: 29.5 % — ABNORMAL LOW (ref 36.0–46.0)
Hemoglobin: 9.7 g/dL — ABNORMAL LOW (ref 12.0–15.0)
MCH: 33.4 pg (ref 26.0–34.0)
MCHC: 32.9 g/dL (ref 30.0–36.0)
MCV: 101.7 fL — ABNORMAL HIGH (ref 78.0–100.0)
PLATELETS: 272 10*3/uL (ref 150–400)
RBC: 2.9 MIL/uL — AB (ref 3.87–5.11)
RDW: 14.7 % (ref 11.5–15.5)
WBC: 10.3 10*3/uL (ref 4.0–10.5)

## 2015-09-24 LAB — BASIC METABOLIC PANEL
ANION GAP: 7 (ref 5–15)
BUN: 11 mg/dL (ref 6–20)
CALCIUM: 8.5 mg/dL — AB (ref 8.9–10.3)
CHLORIDE: 107 mmol/L (ref 101–111)
CO2: 23 mmol/L (ref 22–32)
CREATININE: 0.67 mg/dL (ref 0.44–1.00)
GFR calc non Af Amer: >60 mL/min (ref >60)
Glucose, Bld: 108 mg/dL — ABNORMAL HIGH (ref 65–99)
Potassium: 3.7 mmol/L (ref 3.5–5.1)
Sodium: 137 mmol/L (ref 135–145)

## 2015-09-24 MED ORDER — RACEPINEPHRINE HCL 2.25 % IN NEBU
0.5000 mL | INHALATION_SOLUTION | Freq: Once | RESPIRATORY_TRACT | Status: AC
  Administered 2015-09-24: 0.5 mL via RESPIRATORY_TRACT
  Filled 2015-09-24: qty 0.5

## 2015-09-24 MED ORDER — WHITE PETROLATUM GEL
Status: AC
  Administered 2015-09-24: 0.2
  Filled 2015-09-24: qty 1

## 2015-09-24 NOTE — Consult Note (Signed)
Reason for Consult: Post-op hoarseness Referring Physician: Ruta Hinds, MD  HPI:  Tabitha Harris is an 82 y.o. female who underwent right CEA yesterday by Dr. Kellie Simmering to treat her carotid artery stenosis. The patient has been c/o hoarseness since her surgery. Her initial stridor has resolved. She has no previous history of ENT surgery. No breathing difficulty.  Past Medical History  Diagnosis Date  . History of breast cancer     left  . Hypertension   . Hyperlipidemia   . GERD (gastroesophageal reflux disease)   . Thyroid disease     hypothyroidism  . Hernia     Past Surgical History  Procedure Laterality Date  . Breast lumpectomy  2012    left    Family History  Problem Relation Age of Onset  . Cancer Mother     breast    Social History:  reports that she has never smoked. She does not have any smokeless tobacco history on file. She reports that she drinks alcohol. She reports that she does not use illicit drugs.  Allergies:  Allergies  Allergen Reactions  . Codeine     Prior to Admission medications   Medication Sig Start Date End Date Taking? Authorizing Provider  amLODipine (NORVASC) 5 MG tablet Take 5 mg by mouth daily.     Yes Historical Provider, MD  aspirin 81 MG tablet Take 81 mg by mouth daily.     Yes Historical Provider, MD  atorvastatin (LIPITOR) 20 MG tablet Take 20 mg by mouth daily.     Yes Historical Provider, MD  bimatoprost (LUMIGAN) 0.01 % SOLN 1 drop at bedtime.   Yes Historical Provider, MD  calcium carbonate (OS-CAL) 600 MG TABS Take 600 mg by mouth 2 (two) times daily with a meal.     Yes Historical Provider, MD  dorzolamide (TRUSOPT) 2 % ophthalmic solution 1 drop 2 (two) times daily.     Yes Historical Provider, MD  dorzolamide-timolol (COSOPT) 22.3-6.8 MG/ML ophthalmic solution  12/13/11  Yes Historical Provider, MD  esomeprazole (NEXIUM) 40 MG capsule Take 40 mg by mouth daily before breakfast.     Yes Historical Provider, MD  fish  oil-omega-3 fatty acids 1000 MG capsule Take 2 g by mouth daily.     Yes Historical Provider, MD  levothyroxine (SYNTHROID, LEVOTHROID) 75 MCG tablet Take 75 mcg by mouth daily.     Yes Historical Provider, MD  Multiple Vitamins-Minerals (MULTIVITAMIN WITH MINERALS) tablet Take 1 tablet by mouth daily.     Yes Historical Provider, MD  letrozole (FEMARA) 2.5 MG tablet Take 1 tablet (2.5 mg total) by mouth daily. 08/03/13   Chauncey Cruel, MD    Medications:  I have reviewed the patient's current medications. Scheduled: . amLODipine  5 mg Oral Daily  . aspirin  81 mg Oral Daily  . atorvastatin  20 mg Oral q1800  . calcium carbonate  1 tablet Oral BID WC  . docusate sodium  100 mg Oral Daily  . dorzolamide  1 drop Both Eyes BID  . latanoprost  1 drop Both Eyes QHS  . levothyroxine  75 mcg Oral QAC breakfast  . multivitamin with minerals  1 tablet Oral Daily  . omega-3 acid ethyl esters  2 g Oral Daily  . pantoprazole  40 mg Oral Daily   IEP:PIRJJOACZYSAY **OR** acetaminophen, alum & mag hydroxide-simeth, guaiFENesin-dextromethorphan, hydrALAZINE, labetalol, metoprolol, morphine injection, ondansetron, phenol, potassium chloride, senna-docusate, traMADol  Results for orders placed or performed during the hospital  encounter of 09/20/15 (from the past 48 hour(s))  MRSA PCR Screening     Status: None   Collection Time: 09/23/15  4:20 AM  Result Value Ref Range   MRSA by PCR NEGATIVE NEGATIVE    Comment:        The GeneXpert MRSA Assay (FDA approved for NASAL specimens only), is one component of a comprehensive MRSA colonization surveillance program. It is not intended to diagnose MRSA infection nor to guide or monitor treatment for MRSA infections.   Type and screen Sebastian     Status: None   Collection Time: 09/23/15  8:25 AM  Result Value Ref Range   ABO/RH(D) A POS    Antibody Screen NEG    Sample Expiration 09/26/2015   ABO/Rh     Status: None    Collection Time: 09/23/15  8:25 AM  Result Value Ref Range   ABO/RH(D) A POS   Basic metabolic panel     Status: Abnormal   Collection Time: 09/24/15  3:03 AM  Result Value Ref Range   Sodium 137 135 - 145 mmol/L   Potassium 3.7 3.5 - 5.1 mmol/L   Chloride 107 101 - 111 mmol/L   CO2 23 22 - 32 mmol/L   Glucose, Bld 108 (H) 65 - 99 mg/dL   BUN 11 6 - 20 mg/dL   Creatinine, Ser 0.67 0.44 - 1.00 mg/dL   Calcium 8.5 (L) 8.9 - 10.3 mg/dL   GFR calc non Af Amer >60 >60 mL/min   GFR calc Af Amer >60 >60 mL/min    Comment: (NOTE) The eGFR has been calculated using the CKD EPI equation. This calculation has not been validated in all clinical situations. eGFR's persistently <60 mL/min signify possible Chronic Kidney Disease.    Anion gap 7 5 - 15  CBC     Status: Abnormal   Collection Time: 09/24/15  3:03 AM  Result Value Ref Range   WBC 10.3 4.0 - 10.5 K/uL   RBC 2.90 (L) 3.87 - 5.11 MIL/uL   Hemoglobin 9.7 (L) 12.0 - 15.0 g/dL   HCT 29.5 (L) 36.0 - 46.0 %   MCV 101.7 (H) 78.0 - 100.0 fL   MCH 33.4 26.0 - 34.0 pg   MCHC 32.9 30.0 - 36.0 g/dL   RDW 14.7 11.5 - 15.5 %   Platelets 272 150 - 400 K/uL    No results found.  Review of Systems Negative except as noted above. Blood pressure 116/77, pulse 93, temperature 98.8 F (37.1 C), temperature source Oral, resp. rate 18, height '4\' 11"'$  (1.499 m), weight 50.1 kg (110 lb 7.2 oz), SpO2 97 %. General appearance: alert, cooperative and appears stated age Head: Normocephalic, without obvious abnormality, atraumatic Eyes: conjunctivae/corneas clear. PERRL, EOM's intact. Fundi benign. Ears: normal TM's and external ear canals both ears Nose: Nares normal. Septum midline. Mucosa normal. No drainage or sinus tenderness. Throat: lips, mucosa, and tongue normal; teeth and gums normal Neck: no adenopathy, no carotid bruit, no JVD, supple, symmetrical, trachea midline and thyroid not enlarged, symmetric, no tenderness/mass/nodules Chest wall:  no tenderness Skin: Skin color, texture, turgor normal. No rashes or lesions Lymph nodes: Cervical, supraclavicular, and axillary nodes normal. Incision/Wound: Right CEA incision c/d/i Voice is raspy and weak.  Procedure:  Flexible Fiberoptic Laryngoscopy Anesthesia: Topical oxymetazoline and lidocaine Indication: Persistent throat pain. Description: Risks, benefits, and alternatives of flexible endoscopy were explained to the patient. Specific mention was made of the risk of throat numbness with  difficulty swallowing, possible bleeding from the nose and mouth, and pain from the procedure.  The patient gave oral consent to proceed.  The nasal cavities were decongested and anesthetised with a combination of oxymetazoline and 4% lidocaine solution.  The flexible scope was inserted into the right nasal cavity and advanced towards the nasopharynx.  Visualized mucosa over the turbinates and septum were normal.  The nasopharynx was clear.  Oropharyngeal walls were symmetric and mobile without lesion, mass, or edema.  Hypopharynx was also without  lesion or edema.   No lesions or asymmetry in the supraglottic larynx.  Arytenoid mucosa was normal.  The right vocal cord is paralysed at paramedian position. The left vocal cord is mobile. No other lesions or abnormality.  Assessment/Plan: The patient's hoarseness is a result of her right vocal cord paralysis. Her left vocal cord is mobile. Her glottis is patent. This is likely secondary to trauma to the right recurrent laryngeal nerve. Pt may follow up with me as an outpatient in approximately 2 weeks. Contact info given to pt.  If no improvement in 2 weeks, will consider right vocal cord medialization procedure.  Accalia Rigdon,SUI W 09/24/2015, 11:30 AM

## 2015-09-24 NOTE — Progress Notes (Signed)
[facility] PROGRESS NOTE  Tabitha Harris WLN:[id number] DOB: Feb 20, 1920 DOA: 09/20/2015 PCP: Merrilee Seashore, MD  Interim summary and HPI 82 y.o. female Who presented complaining of transient vision loss of right eye field which lasted for several minutes which occurred day prior to admission. The problem came on insidiously. Resolved on its own. Nothing she is aware of makes it better or worse. Patient presented to her ophthalmologist recommended coming to the ED for stroke workup.  Found to have TIA secondary to right ICA stenosis; vascular surgery consulted. S/P endarterectomy on 6/2/1. Will follow recovery and discharge plans base on clinical response/needs. Hopefully home on 09/25/15  Assessment/Plan: 1-transient right vision loss: appears to be TIA secondary to right ICA stenosis -no acute stroke appreciated on CT or MRI -normal ESR and CRP -normal lipid panel -A1C 5.5 -S/P endarterectomy on 09/23/15 -will follow post-operative rec's by vascular surgery   2-Right ICA stenosis -S?P right endarterectomy; tolerated and recovering well -will continue aspirin and statins   3-HTN: -stable overall -will continue current antihypertensive regimen   4-HLD: -will continue statins  5-hypothyroidism -continue synthroid  6-elevated intraocular pressure/glaucoma -continue xalatan and trusopt   7-GERD: -continue PPI  8-right vocal cord paralysis: -continue supportive care for now -follow up as an outpatient in 2 weeks  Code Status: Full Code Family Communication: no family at bedside  Disposition Plan: will transfer out of stepdown unit. Most likely home tomorrow 09/25/15. Outpatient follow up with vascular surgery and in 2 weeks with ENT  Consultants:  Neurology  Vascular surgery   Cardiology service   ENT  Procedures:  2-D echo: pending  Carotid duplex: Carotid Duplex (Doppler) has been completed.  Findings suggest 60-79% right internal carotid artery  stenosis by elevated end diastolic velocities, however peak systolic velocities and ICA/CCA ratio suggest 80-99% right internal carotid artery stenosis.  Right vertebral artery is patent with antegrade flow.  Left internal carotid artery exhibits 40-59% stenosis by elevated velocities.  Left vertebral artery is patent with retrograde flow.  Antibiotics:  None   HPI/Subjective: Afebrile, no CP or SOB. Patient with hoarseness and some overnight stridors.   Objective: Filed Vitals:   09/24/15 0900 09/24/15 1542  BP:  124/110  Pulse:  86  Temp: 98.8 F (37.1 C) 97.9 F (36.6 C)  Resp:  18    Intake/Output Summary (Last 24 hours) at 09/24/15 1710 Last data filed at 09/24/15 0923  Gross per 24 hour  Intake   1220 ml  Output    900 ml  Net    320 ml   Filed Weights   09/20/15 1420 09/20/15 1944 09/23/15 1520  Weight: 46.72 kg (103 lb) 46.72 kg (103 lb) 50.1 kg (110 lb 7.2 oz)    Exam:   General:  Afebrile, feeling ok and no complaining of CP, SOB or further vision problems. Hoarse after surgery and overnight with episodes of stridors (last one resolved now).  Cardiovascular: S1 and S2, no rubs or gallops   Respiratory: CTA bilaterally  Abdomen: soft, NT, ND, positive BS  Musculoskeletal: no edema or cyanosis   Data Reviewed: Basic Metabolic Panel:  Recent Labs Lab 09/20/15 1437 09/20/15 1447 09/24/15 0303  NA 140 143 137  K 4.1 4.2 3.7  CL 106 104 107  CO2 27  --  23  GLUCOSE 101* 98 108*  BUN '11 13 11  '$ CREATININE 0.80 0.70 0.67  CALCIUM 9.5  --  8.5*   Liver Function Tests:  Recent Labs Lab 09/20/15 1437  AST 27  ALT 22  ALKPHOS 47  BILITOT 1.2  PROT 6.7  ALBUMIN 3.9   CBC:  Recent Labs Lab 09/20/15 1437 09/20/15 1447 09/24/15 0303  WBC 6.1  --  10.3  NEUTROABS 2.7  --   --   HGB 11.4* 11.6* 9.7*  HCT 34.0* 34.0* 29.5*  MCV 103.0*  --  101.7*  PLT 302  --  272   Studies: No results found.  Scheduled Meds: . amLODipine  5 mg  Oral Daily  . aspirin  81 mg Oral Daily  . atorvastatin  20 mg Oral q1800  . calcium carbonate  1 tablet Oral BID WC  . docusate sodium  100 mg Oral Daily  . dorzolamide  1 drop Both Eyes BID  . latanoprost  1 drop Both Eyes QHS  . levothyroxine  75 mcg Oral QAC breakfast  . multivitamin with minerals  1 tablet Oral Daily  . omega-3 acid ethyl esters  2 g Oral Daily  . pantoprazole  40 mg Oral Daily   Continuous Infusions:    Active Problems:   TIA (transient ischemic attack)   Stroke-like symptom   Carotid stenosis   Preoperative cardiovascular examination   HLD (hyperlipidemia)   Essential hypertension   Esophageal reflux    Time spent: 25 minutes    Barton Dubois  [facility] Pager [phone]. If 7PM-7AM, please contact night-coverage at [email or web address], password [facility] 09/24/2015, 5:10 PM  LOS: 4 days

## 2015-09-24 NOTE — Progress Notes (Signed)
Called per floor RN regarding Pt with new complaint of trouble swallowing with audible stridor. Upon my arrival Pt found resting in bed, awake oriented following commands. Surgical site intact, dressing with shadowing per RN this is old since procedure. No swelling at site. Lung sounds clear, upper airway course but I did not hear stridor. VSS including Po2 at 97-100% on room air. During my visit Dr. Oneida Alar came to bedside. After his assessment INH Epi ordered x 1 with no further orders. RN advised to monitor Pt closely and notify Provider and myself for worsening changes.

## 2015-09-24 NOTE — Progress Notes (Signed)
Called to see pt for noisy/stridor type breathing and hoarseness.  Pt has been hoarse since operation earlier today but has progressed to more noisy stridor type breathing as the night has progressed  She does not subjectively feel that she is short of breath but has a sensation of "mucous" in the back of her throat which she can't clear  Filed Vitals:   09/23/15 1700 09/23/15 1942 09/23/15 2100 09/23/15 2308  BP: 108/67 124/67  95/61  Pulse: 77 102 72 94  Temp:  97.4 F (36.3 C)  98.8 F (37.1 C)  TempSrc:  Axillary  Oral  Resp: 15 18 18 22   Height:      Weight:      SpO2: 96% 100% 97% 95%   Pt is able to converse comfortably but does have noisy stridorous inspiration Right neck no significant hematoma  A: pt with hoarseness and some stridor type breathing. Does not appear to have obvious hematoma. ? Some airway edema from operation/ET tube or vocal cord dysfunction.  I do not believe she has significant airway compromise currently  Will give a racemic epi treatment to see if any improvement.  If not real change will have ENT evaluate  Ruta Hinds, MD [facility] [place] Office: [phone] Pager: [phone]

## 2015-09-24 NOTE — Progress Notes (Signed)
Pt still hoarse this morning but upper airway sounds have improved considerably.  Filed Vitals:   09/24/15 0500 09/24/15 0505 09/24/15 0515 09/24/15 0600  BP:      Pulse: 82 87 80 78  Temp:      TempSrc:      Resp: 14 12 20 22   Height:      Weight:      SpO2: 92% 85% 100% 100%    Neck: no hematoma, incision intact Neuro: Symmetric UE/LE 5/5 motor, tongue midline, voice still hoarse and weak  A: s/p symptomatic right CEA no motor neuro deficits but persistent hoarseness P: d/c aline Ok to eat/drink Will have Dr Benjamine Mola from ENT eval vocal cord function today Possible d/c home today or tomorrow depending on ENT eval and family situation  Ruta Hinds, MD [facility] [place] Office: [phone] Pager: [phone]

## 2015-09-24 NOTE — Progress Notes (Signed)
Pt awakened with stridor breathing.  O2 sat 100% on room air.  Voice is very hoarse.  States she feels like there is a ball in her neck that moves when she swallows.  When pt tried drinking water she stated that the water "went up instead of down".  Stridor can be heard from outside the room.  Charge RN notified, RR nurse assessed.  Surgeon on call notified.  Will continue to monitor. Fara Olden P

## 2015-09-24 NOTE — Progress Notes (Signed)
STROKE TEAM PROGRESS NOTE   SUBJECTIVE (INTERVAL HISTORY) She Had right carotid endarterectomy yesterday which went well. She has mild hoarseness of voice  . She has no tongue paralysis. is stable from neurological standpoint. She is sitting up in a bedside chair and looks comfortable OBJECTIVE Temp:  [97.4 F (36.3 C)-98.8 F (37.1 C)] 98.8 F (37.1 C) (06/03 0900) Pulse Rate:  [69-102] 93 (06/03 0714) Cardiac Rhythm:  [-] Normal sinus rhythm (06/03 0714) Resp:  [12-25] 18 (06/03 0714) BP: (94-129)/(58-77) 116/77 mmHg (06/03 0714) SpO2:  [85 %-100 %] 97 % (06/03 0714) Arterial Line BP: (105-127)/(47-58) 121/48 mmHg (06/02 1500) Weight:  [110 lb 7.2 oz (50.1 kg)] 110 lb 7.2 oz (50.1 kg) (06/02 1520)  CBC:   Recent Labs Lab 09/20/15 1437 09/20/15 1447 09/24/15 0303  WBC 6.1  --  10.3  NEUTROABS 2.7  --   --   HGB 11.4* 11.6* 9.7*  HCT 34.0* 34.0* 29.5*  MCV 103.0*  --  101.7*  PLT 302  --  607   Basic Metabolic Panel:   Recent Labs Lab 09/20/15 1437 09/20/15 1447 09/24/15 0303  NA 140 143 137  K 4.1 4.2 3.7  CL 106 104 107  CO2 27  --  23  GLUCOSE 101* 98 108*  BUN '11 13 11  '$ CREATININE 0.80 0.70 0.67  CALCIUM 9.5  --  8.5*    Lipid Panel:     Component Value Date/Time   CHOL 136 09/21/2015 0519   TRIG 59 09/21/2015 0519   HDL 60 09/21/2015 0519   CHOLHDL 2.3 09/21/2015 0519   VLDL 12 09/21/2015 0519   LDLCALC 64 09/21/2015 0519   HgbA1c:  Lab Results  Component Value Date   HGBA1C 5.5 09/21/2015   Urine Drug Screen: No results found for: LABOPIA, COCAINSCRNUR, LABBENZ, AMPHETMU, THCU, LABBARB   IMAGING  No results found. Carotid Doppler   Findings suggest 60-79% right internal carotid artery stenosis by elevated end diastolic velocities, however peak systolic velocities and ICA/CCA ratio suggest 80-99% right internal carotid artery stenosis. Right vertebral artery is patent with antegrade flow. Left internal carotid artery exhibits 40-59% stenosis  by elevated velocities. Left vertebral artery is patent with retrograde flow.  2D Echocardiogram  - Left ventricle: The cavity size was normal. There was mild focal basal hypertrophy of the septum. Systolic function was vigorous. The estimated ejection fraction was in the range of 65% to 70%. Wall motion was normal; there were no regional wall motion abnormalities. Features are consistent with a pseudonormal left ventricular filling pattern, with concomitant abnormal relaxation and increased filling pressure (grade 2 diastolic dysfunction). - Aortic valve: Severely calcified annulus. Trileaflet. Mild diffuse thickening and calcification, consistent with sclerosis. - Mitral valve: Calcified annulus. - Pulmonary arteries: PA peak pressure: 33 mm Hg (S).   PHYSICAL EXAM GENERAL- alert, co-operative, appears as stated age, not in any distress, sitting in recliner at bedside. The right carotid endarterectomy incision looks clean without bleeding HEENT- Atraumatic, normocephalic, appears to have a cardiac murmur transmitting to her carotids.  CARDIAC- RRR, 3/6 systolic murmurs, no rubs or gallops. RESP- Moving equal volumes of air, and no wheezes or crackles. ABDOMEN- Soft, non tender. NEURO- awakeAlert, oriented x 3, Visual fields intact, EOMI,fundi not visualized. Vision acuity seems adequate. Normal hearing and facial strength.  Motor system exam shows a normal  , strength in  upper and lower extremities- 5/5, finger to nose test normal bilat, rapid alternating movement- intact. EXTREMITIES- warm, moving extremities spontaneously.  ASSESSMENT/PLAN Ms. Tabitha Harris is a 82 y.o. female with history of HTN, HLD, L breast cancer, thyroid disease and GERD presenting with R eye vision loss thought to be amaurosis fugax in setting of negative ESR & CRP. She did not receive IV t-PA due to resolved symptoms.    amaurosis fugax secondary to right carotid artery stenosis  MRI  No acute stroke. Old R  thalamic lacune. Atrophy. small vessel disease.  MRA  normal  Carotid Doppler  R 60-79% stenosis, L 40-59%. L VA flow retrograde  2D Echo  EF 65-70%. No source of embolus   LDL 64  HgbA1c 5.5  Heparin 5000 units sq tid for VTE prophylaxis Diet Heart Room service appropriate?: Yes; Fluid consistency:: Thin  No antithrombotic prior to admission, now on aspirin 81 mg daily  Patient counseled to be compliant with her antithrombotic medications  Vascular surgical eval- for CEA in am  Ongoing aggressive stroke risk factor management  Therapy recommendations:  No PT follow up  Disposition:  Home  Carotid Artery Stenosis  Aspirin, Statin, HTN control  Vascular Surg eval.  Hypertension  Stable  Hyperlipidemia  Home meds:  lipitor 20 and fish oil, statin resumed in hospital  LDL 64, goal < 70  Continue statin at discharge  Other Stroke Risk Factors  Advanced age  ETOH use  Other Active Problems  Thyroid disease on synthroid.  Hospital day # 4  I have personally examined this patient, reviewed notes, independently viewed imaging studies, participated in medical decision making and plan of care. I have made any additions or clarifications directly to the above note.  Patient likely to be discharged in the next few days. Hoarseness of voice expected to improve gradually .Consider outpatient ENT referral to evaluate vocal cords. Antony Contras, MD Medical Director [facility] Pager: [phone] 09/24/2015 1:41 PM

## 2015-09-24 NOTE — Progress Notes (Signed)
Received from 3S via wheelchair; patient is alert and oriented; daughter at bedside; oriented to room and unit routine.

## 2015-09-25 DIAGNOSIS — K219 Gastro-esophageal reflux disease without esophagitis: Secondary | ICD-10-CM

## 2015-09-25 DIAGNOSIS — I10 Essential (primary) hypertension: Secondary | ICD-10-CM

## 2015-09-25 DIAGNOSIS — J3800 Paralysis of vocal cords and larynx, unspecified: Secondary | ICD-10-CM

## 2015-09-25 DIAGNOSIS — E785 Hyperlipidemia, unspecified: Secondary | ICD-10-CM

## 2015-09-25 DIAGNOSIS — D62 Acute posthemorrhagic anemia: Secondary | ICD-10-CM

## 2015-09-25 DIAGNOSIS — G459 Transient cerebral ischemic attack, unspecified: Principal | ICD-10-CM

## 2015-09-25 MED ORDER — ACETAMINOPHEN 500 MG PO TABS: 1000.0000 mg | ORAL_TABLET | Freq: Three times a day (TID) | ORAL | Status: AC | PRN

## 2015-09-25 MED ORDER — PHENOL 1.4 % MT LIQD: 1.0000 | OROMUCOSAL | Status: DC | PRN

## 2015-09-25 NOTE — Progress Notes (Signed)
STROKE TEAM PROGRESS NOTE   SUBJECTIVE (INTERVAL HISTORY) She Had right carotid endarterectomy 09/23/15 which went well. She still has mild hoarseness of voice  . She  is stable from neurological standpoint. She is sitting up in a bedside chair and looks comfortable OBJECTIVE Temp:  [97.9 F (36.6 C)-98.9 F (37.2 C)] 98 F (36.7 C) (06/04 1002) Pulse Rate:  [65-105] 65 (06/04 1019) Cardiac Rhythm:  [-]  Resp:  [18-20] 20 (06/04 1002) BP: (95-192)/(48-164) 104/48 mmHg (06/04 1019) SpO2:  [91 %-100 %] 100 % (06/04 1002) Weight:  [110 lb 7.2 oz (50.1 kg)] 110 lb 7.2 oz (50.1 kg) (06/03 1922)  CBC:   Recent Labs Lab 09/20/15 1437 09/20/15 1447 09/24/15 0303  WBC 6.1  --  10.3  NEUTROABS 2.7  --   --   HGB 11.4* 11.6* 9.7*  HCT 34.0* 34.0* 29.5*  MCV 103.0*  --  101.7*  PLT 302  --  024   Basic Metabolic Panel:   Recent Labs Lab 09/20/15 1437 09/20/15 1447 09/24/15 0303  NA 140 143 137  K 4.1 4.2 3.7  CL 106 104 107  CO2 27  --  23  GLUCOSE 101* 98 108*  BUN '11 13 11  '$ CREATININE 0.80 0.70 0.67  CALCIUM 9.5  --  8.5*    Lipid Panel:     Component Value Date/Time   CHOL 136 09/21/2015 0519   TRIG 59 09/21/2015 0519   HDL 60 09/21/2015 0519   CHOLHDL 2.3 09/21/2015 0519   VLDL 12 09/21/2015 0519   LDLCALC 64 09/21/2015 0519   HgbA1c:  Lab Results  Component Value Date   HGBA1C 5.5 09/21/2015   Urine Drug Screen: No results found for: LABOPIA, COCAINSCRNUR, LABBENZ, AMPHETMU, THCU, LABBARB   IMAGING  No results found. Carotid Doppler   Findings suggest 60-79% right internal carotid artery stenosis by elevated end diastolic velocities, however peak systolic velocities and ICA/CCA ratio suggest 80-99% right internal carotid artery stenosis. Right vertebral artery is patent with antegrade flow. Left internal carotid artery exhibits 40-59% stenosis by elevated velocities. Left vertebral artery is patent with retrograde flow.  2D Echocardiogram  - Left  ventricle: The cavity size was normal. There was mild focal basal hypertrophy of the septum. Systolic function was vigorous. The estimated ejection fraction was in the range of 65% to 70%. Wall motion was normal; there were no regional wall motion abnormalities. Features are consistent with a pseudonormal left ventricular filling pattern, with concomitant abnormal relaxation and increased filling pressure (grade 2 diastolic dysfunction). - Aortic valve: Severely calcified annulus. Trileaflet. Mild diffuse thickening and calcification, consistent with sclerosis. - Mitral valve: Calcified annulus. - Pulmonary arteries: PA peak pressure: 33 mm Hg (S).   PHYSICAL EXAM GENERAL- alert, co-operative, appears as stated age, not in any distress, sitting in recliner at bedside. The right carotid endarterectomy incision looks clean without bleeding HEENT- Atraumatic, normocephalic, appears to have a cardiac murmur transmitting to her carotids.  CARDIAC- RRR, 3/6 systolic murmurs, no rubs or gallops. RESP- Moving equal volumes of air, and no wheezes or crackles. ABDOMEN- Soft, non tender. NEURO- awakeAlert, oriented x 3, Visual fields intact, EOMI,fundi not visualized. Vision acuity seems adequate. Normal hearing and facial strength.  Motor system exam shows a normal  , strength in  upper and lower extremities- 5/5, finger to nose test normal bilat, rapid alternating movement- intact. EXTREMITIES- warm, moving extremities spontaneously.   ASSESSMENT/PLAN Ms. Tabitha Harris is a 82 y.o. female with history of  HTN, HLD, L breast cancer, thyroid disease and GERD presenting with R eye vision loss thought to be amaurosis fugax in setting of negative ESR & CRP. She did not receive IV t-PA due to resolved symptoms.    amaurosis fugax secondary to right carotid artery stenosis  MRI  No acute stroke. Old R thalamic lacune. Atrophy. small vessel disease.  MRA  normal  Carotid Doppler  R 60-79% stenosis, L  40-59%. L VA flow retrograde  2D Echo  EF 65-70%. No source of embolus   LDL 64  HgbA1c 5.5  Heparin 5000 units sq tid for VTE prophylaxis Diet Heart Room service appropriate?: Yes; Fluid consistency:: Thin  No antithrombotic prior to admission, now on aspirin 81 mg daily  Patient counseled to be compliant with her antithrombotic medications  Vascular surgical eval- for CEA in am  Ongoing aggressive stroke risk factor management  Therapy recommendations:  No PT follow up  Disposition:  Home  Carotid Artery Stenosis  Aspirin, Statin, HTN control  Vascular Surg eval.  Hypertension  Stable  Hyperlipidemia  Home meds:  lipitor 20 and fish oil, statin resumed in hospital  LDL 64, goal < 70  Continue statin at discharge  Other Stroke Risk Factors  Advanced age  ETOH use  Other Active Problems  Thyroid disease on synthroid.  Hospital day # 5   .  Patient likely to be discharged in the next few days. Hoarseness of voice expected to improve gradually .Consider outpatient ENT referral to evaluate vocal cords.Stroke team will sign off. Kindlycall for questions. Follow-up as an outpatient in stroke clinic in 2 months. Antony Contras, MD Medical Director [facility] Pager: [phone] 09/25/2015 12:52 PM

## 2015-09-25 NOTE — Progress Notes (Signed)
Patient ready for discharge to home; discharge instructions given and reviewed;  daughter present for instructions and accompanying patient home.

## 2015-09-25 NOTE — Progress Notes (Addendum)
  [facility] Progress Note  Subjective  - POD #2  Voice hoarse. Doing okay.   Objective Filed Vitals:   09/25/15 0122 09/25/15 0637  BP: 100/50 110/52  Pulse: 91 95  Temp: 98.5 F (36.9 C) 98.6 F (37 C)  Resp: 20 20    Intake/Output Summary (Last 24 hours) at 09/25/15 0920 Last data filed at 09/24/15 2050  Gross per 24 hour  Intake    290 ml  Output      0 ml  Net    290 ml   Right neck incision c/d/i. No hematoma. Voice hoarse. No other focal deficits.   Assessment/Planning: 82 y.o. female is s/p: right carotid endartectomy 2 Days Post-Op   Stable from vascular standpoint for d/c. Voice hoarseness from right vocal cord paralysis. Appreciate ENT consult. Pt to follow up as outpatient.  Patient wants to stay another day. Plan d/c tomorrow.   Tabitha Harris 09/25/2015 9:20 AM -- Agree with above.  Still hoarse.  ENT eval right vocal cord paralysis.  Discussed with pt.  F/U scheduled. No hematoma o/w neuro intact Plan for d/c tomorrow  Tabitha Hinds, MD [facility] [place] Office: [phone] Pager: [phone]  Laboratory CBC    Component Value Date/Time   WBC 10.3 09/24/2015 0303   WBC 5.7 02/28/2012 1203   HGB 9.7* 09/24/2015 0303   HGB 11.3* 02/28/2012 1203   HCT 29.5* 09/24/2015 0303   HCT 32.8* 02/28/2012 1203   PLT 272 09/24/2015 0303   PLT 250 02/28/2012 1203    BMET    Component Value Date/Time   NA 137 09/24/2015 0303   NA 142 02/28/2012 1203   K 3.7 09/24/2015 0303   K 4.5 02/28/2012 1203   CL 107 09/24/2015 0303   CL 109* 02/28/2012 1203   CO2 23 09/24/2015 0303   CO2 29 02/28/2012 1203   GLUCOSE 108* 09/24/2015 0303   GLUCOSE 96 02/28/2012 1203   BUN 11 09/24/2015 0303   BUN 14.0 02/28/2012 1203   CREATININE 0.67 09/24/2015 0303   CREATININE 0.8 02/28/2012 1203   CALCIUM 8.5* 09/24/2015 0303   CALCIUM 9.9 02/28/2012 1203   GFRNONAA >60 09/24/2015 0303   GFRAA >60 09/24/2015 0303      COAG Lab Results  Component Value Date   INR 1.07 09/20/2015   No results found for: PTT  Antibiotics Anti-infectives    Start     Dose/Rate Route Frequency Ordered Stop   09/23/15 2000  cefUROXime (ZINACEF) 1.5 g in dextrose 5 % 50 mL IVPB     1.5 g 100 mL/hr over 30 Minutes Intravenous Every 12 hours 09/23/15 1529 09/24/15 0953   09/22/15 1215  cefUROXime (ZINACEF) 1.5 g in dextrose 5 % 50 mL IVPB     1.5 g 100 mL/hr over 30 Minutes Intravenous To Surgery 09/22/15 1158 09/23/15 [place], PA-C [facility] Office: [phone] Pager: [phone] 09/25/2015 9:20 AM

## 2015-09-25 NOTE — Discharge Summary (Signed)
Physician Discharge Summary  Tabitha Harris CZY:[id number] DOB: [birth year] DOA: 09/20/2015  PCP: Merrilee Seashore, MD  Admit date: 09/20/2015 Discharge date: 09/25/2015  Time spent: 35 minutes  Recommendations for Outpatient Follow-up:  Repeat CBC to follow Hgb trend  Reassess BP and adjust antihypertensive drugs as needed Repeat basic metabolic panel to follow electrolytes and renal function  Discharge Diagnoses:  Active Problems:   TIA (transient ischemic attack)   Stroke-like symptom   Carotid stenosis   Preoperative cardiovascular examination   HLD (hyperlipidemia)   Essential hypertension   Esophageal reflux   Vocal cord paralysis mild acute blood loss anemia  Discharge Condition: stable and improved. Discharged home with instructions to follow up with PCP in 10 days; with neurology in 2 months and with vascular surgery in 2 weeks   Diet recommendation: heart healthy diet  Filed Weights   09/20/15 1944 09/23/15 1520 09/24/15 1922  Weight: 46.72 kg (103 lb) 50.1 kg (110 lb 7.2 oz) 50.1 kg (110 lb 7.2 oz)    History of present illness:  As per H&P written by Dr. Wendee Beavers on 09/20/15 82 y.o. female Who presented complaining of transient vision loss of right eye field which lasted for several minutes which occurred day prior to admission. The problem came on insidiously. Resolved on its own. Nothing she is aware of makes it better or worse. Patient presented to her ophthalmologist recommended coming to the ED for TIA/stroke workup.  Hospital Course:  1-transient right vision loss: appears to be TIA secondary to right ICA stenosis -no acute stroke appreciated on CT or MRI -normal ESR and CRP -normal lipid panel -A1C 5.5 -S/P endarterectomy on 09/23/15 -will be discharged home with assistance/care provided by her  Daughter - patient will follow up with neurology in 2 months and follow-up in 2 weeks after discharge with vascular surgery  2-Right ICA stenosis -S/P right  endarterectomy; tolerated and recovering well -will continue aspirin and statins  -follow-up in 2 weeks with vascular service as an outpatient  3-HTN: -stable overall -will continue current antihypertensive regimen   4-HLD: -will continue statins  5-hypothyroidism -continue synthroid  6-elevated intraocular pressure/glaucoma -continue xalatan and Cosopt   7-GERD: -continue PPI  8-right vocal cord paralysis: -continue supportive care for now -follow up as an outpatient in 2 weeks  9-chronic diastolic heart failure: -Compensated -Preserved ejection  Last echocardiogram on June 2017 -advised to follow low sodium/heart healthy diet, continue blood pressure control and to check her weight on daily b -No signs of fluid overload during this hospitalization  10-Mild acute blood loss anemia: -due to surgery -no transfusion needed  -Hgb 9.7 -recommending repeat CBC at follow up to assess Hgb trend   Procedures:  2-D echo: - Left ventricle: The cavity size was normal. There was mild focal  basal hypertrophy of the septum. Systolic function was vigorous.  The estimated ejection fraction was in the range of 65% to 70%.  Wall motion was normal; there were no regional wall motion  abnormalities. Features are consistent with a pseudonormal left  ventricular filling pattern, with concomitant abnormal relaxation  and increased filling pressure (grade 2 diastolic dysfunction). - Aortic valve: Severely calcified annulus. Trileaflet. Mild  diffuse thickening and calcification, consistent with sclerosis. - Mitral valve: Calcified annulus. - Pulmonary arteries: PA peak pressure: 33 mm Hg (S).   Carotid duplex: Carotid Duplex (Doppler) has been completed.  Findings suggest 60-79% right internal carotid artery stenosis by elevated end diastolic velocities, however peak systolic velocities and ICA/CCA ratio suggest  80-99% right internal carotid artery stenosis.  Right vertebral  artery is patent with antegrade flow.  Left internal carotid artery exhibits 40-59% stenosis by elevated velocities.  Left vertebral artery is patent with retrograde flow.  Right endarterectomy   Consultations:  Neurology  Vascular surgery  Cardiology   Discharge Exam: Filed Vitals:   09/25/15 1019 09/25/15 1429  BP: 104/48 106/60  Pulse: 65 67  Temp:  98.5 F (36.9 C)  Resp:  20    General: Afebrile, feeling ok and no complaining of CP, SOB or further vision problems. Still Hoarse after surgery. No wheezing, no stridors and no other complaints.  Cardiovascular: S1 and S2, no rubs or gallops   Respiratory: CTA bilaterally  Abdomen: soft, NT, ND, positive BS  Musculoskeletal: no edema or cyanosis   Discharge Instructions   Discharge Instructions    Diet - low sodium heart healthy    Complete by:  As directed      Discharge instructions    Complete by:  As directed   Take medications as prescribed Follow-up as instructed with vascular surgeon Follow-up in 2 weeks with ENT Arrange follow-up with primary care physician in 10 days Follow-up appointment with neurology in 2 months Keep yourself well hydrated Follow a heart healthy diet          Current Discharge Medication List    START taking these medications   Details  acetaminophen (TYLENOL) 500 MG tablet Take 2 tablets (1,000 mg total) by mouth every 8 (eight) hours as needed for fever or headache (pain). Qty: 40 tablet, Refills: 0    phenol (CHLORASEPTIC) 1.4 % LIQD Use as directed 1 spray in the mouth or throat as needed for throat irritation / pain. Refills: 0      CONTINUE these medications which have NOT CHANGED   Details  amLODipine (NORVASC) 5 MG tablet Take 5 mg by mouth daily.      aspirin 81 MG tablet Take 81 mg by mouth daily.      atorvastatin (LIPITOR) 20 MG tablet Take 20 mg by mouth daily.      bimatoprost (LUMIGAN) 0.01 % SOLN 1 drop at bedtime.    calcium carbonate (OS-CAL)  600 MG TABS Take 600 mg by mouth 2 (two) times daily with a meal.      dorzolamide-timolol (COSOPT) 22.3-6.8 MG/ML ophthalmic solution     esomeprazole (NEXIUM) 40 MG capsule Take 40 mg by mouth daily before breakfast.      fish oil-omega-3 fatty acids 1000 MG capsule Take 2 g by mouth daily.      levothyroxine (SYNTHROID, LEVOTHROID) 75 MCG tablet Take 75 mcg by mouth daily.      Multiple Vitamins-Minerals (MULTIVITAMIN WITH MINERALS) tablet Take 1 tablet by mouth daily.        STOP taking these medications     dorzolamide (TRUSOPT) 2 % ophthalmic solution      letrozole (FEMARA) 2.5 MG tablet        Allergies  Allergen Reactions  . Codeine    Follow-up Information    Follow up with Tinnie Gens, MD In 2 weeks.   Specialties:  Vascular Surgery, Interventional Cardiology, Cardiology   Why:  Office will call you to arrange your appt (sent)   Contact information:   [place] [address] [phone]       Follow up with Ascencion Dike, MD In 2 weeks.   Specialty:  Otolaryngology   Why:  call office for appointment details  Contact information:   [address] [place] [address] [phone]       Follow up with [facility], MD. Schedule an appointment as soon as possible for a visit in 10 days.   Specialty:  Internal Medicine   Contact information:   [address] [place] [address] [phone]       Follow up with SETHI,PRAMOD, MD In 2 months.   Specialties:  Neurology, Radiology   Why:  Contact office for follow-up appointments details   Contact information:   [address] [place] [address] [phone]       The results of significant diagnostics from this hospitalization (including imaging, microbiology, ancillary and laboratory) are listed below for reference.    Significant Diagnostic Studies: Ct Head Wo Contrast  09/20/2015  CLINICAL DATA:  82 year old with amaurosis fugax  involving the right eye on Saturday with loss of vision for approximately 5 min. EXAM: CT HEAD WITHOUT CONTRAST TECHNIQUE: Contiguous axial images were obtained from the base of the skull through the vertex without intravenous contrast. COMPARISON:  None. FINDINGS: Moderate to severe cortical and deep atrophy related to patient age. Mild cerebellar atrophy. Moderate to severe changes of small vessel disease of the white matter diffusely, including the midbrain and pons. Physiologic calcification in the right basal ganglia. No mass lesion. No midline shift. No acute hemorrhage or hematoma. No extra-axial fluid collections. No evidence of acute infarction. No skull fracture or other focal osseous abnormality involving the skull. Mucosal thickening involving the sphenoid sinuses and scattered bilateral ethmoid air cells. Remaining visualized paranasal sinuses, bilateral mastoid air cells and bilateral middle ear cavities well-aerated. Extensive bilateral carotid siphon atherosclerosis. IMPRESSION: 1. No acute intracranial abnormality. 2. Moderate to severe generalized atrophy and chronic microvascular ischemic changes of the white matter. 3. Mild chronic bilateral ethmoid and sphenoid sinus disease. Electronically Signed   By: Evangeline Dakin M.D.   On: 09/20/2015 15:52   Ct Angio Neck W/cm &/or Wo/cm  09/21/2015  CLINICAL DATA:  Symptomatic right carotid bifurcations stenosis. EXAM: CT ANGIOGRAPHY NECK TECHNIQUE: Multidetector CT imaging of the neck was performed using the standard protocol during bolus administration of intravenous contrast. Multiplanar CT image reconstructions and MIPs were obtained to evaluate the vascular anatomy. Carotid stenosis measurements (when applicable) are obtained utilizing NASCET criteria, using the distal internal carotid diameter as the denominator. CONTRAST:  50 mL Isovue 370 COMPARISON:  MRI brain and MRA head 09/20/2015 FINDINGS: Aortic arch: A 3 vessel arch configuration is  present. Dense calcifications are present at the origin of the left subclavian artery with a near occlusive stenosis. There are calcifications at the origins of the left common carotid artery and innominate artery is well without significant stenosis. Right carotid system: Atherosclerotic calcifications are present throughout the course of the right common carotid artery, more prominent distally. Dense calcifications are present at the carotid bifurcation. The lumen is narrowed to 1 mm. This compares with a more distal normal luminal measurement of 4.4 mm. There is also some irregularity of the mid cervical right ICA with a beaded appearance. Left carotid system: Atherosclerotic calcifications are present within the left common carotid artery distally without focal stenosis. Calcified and noncalcified plaque is present at the left carotid bifurcation without significant stenosis. There is some regularity of the distal left ICA compatible with fibromuscular dysplasia. Vertebral arteries:The min vertebral arteries originate from the subclavian arteries bilaterally. The left vertebral artery origin is just above the area of dense  calcification. Calcifications are present in the proximal left vertebral artery at the level of C7. The right vertebral artery is slightly dominant to the left. Skeleton: Multilevel degenerative changes are present within the cervical spine. There is degenerative grade 1 retrolisthesis of 4 mm at C5-6 and slight anterolisthesis at C7-T1. Endplate degenerative changes and uncovertebral spurring is present throughout. Osseous foraminal narrowing is most pronounced on the right at C5-6. No focal lytic or blastic lesions are present. Mild mucosal disease is present in the sphenoid sinuses bilaterally. Other neck: The thyroid is atrophic. No focal lytic or blastic lesions are present. The salivary glands are within normal limits bilaterally. No significant cervical adenopathy is present. The  visualized lung apices are clear. There is some pleural parenchymal scarring. IMPRESSION: 1. Greater than 70% stenosis of the right internal carotid artery through the carotid bifurcation. 2. Diffuse atherosclerotic changes within the distal common carotid arteries and both carotid bifurcations without significant stenosis on the left. 3. High-grade, near occlusive, stenosis of the proximal left subclavian artery at the aortic arch. The left vertebral artery originates just be on the area calcification without significant stenosis. Of note, flow directionality cannot be determined on the basis of CT. 4. Noncalcified and vessel irregularity and beading in the mid cervical internal carotid arteries bilaterally suggesting fibromuscular dysplasia as well. 5. Multilevel spondylosis of the cervical spine with grade 1 retrolisthesis at C5-6 and slight anterolisthesis at C7-T1. Osseous foraminal narrowing is present on the right at C5-6. Electronically Signed   By: Marin Roberts M.D.   On: 09/21/2015 18:16   Mr Brain Wo Contrast  09/21/2015  CLINICAL DATA:  Initial evaluation for acute transient right-sided visual loss. EXAM: MRI HEAD WITHOUT CONTRAST MRA HEAD WITHOUT CONTRAST TECHNIQUE: Multiplanar, multiecho pulse sequences of the brain and surrounding structures were obtained without intravenous contrast. Angiographic images of the head were obtained using MRA technique without contrast. COMPARISON:  Prior CT from earlier the same day FINDINGS: MRI HEAD FINDINGS Diffuse prominence of the CSF containing spaces is compatible with generalized age-related cerebral atrophy. Patchy T2/FLAIR hyperintensity within the periventricular, deep, and subcortical white matter both cerebral hemispheres most consistent with chronic small vessel ischemic disease, mild for age. Remote lacunar infarct present within the ventral right thalamus. No remote cortical infarct. No abnormal foci of restricted diffusion to suggest acute  infarct. Gray-white matter differentiation maintained. Major intracranial vascular flow voids are preserved. No acute or chronic intracranial hemorrhage. No mass lesion, midline shift, or mass effect. Ventricular prominence related to global parenchymal volume loss without hydrocephalus. No extra-axial fluid collection. Major dural sinuses are grossly patent. Craniocervical junction within normal limits. Degenerative disc bulge noted at C3-4 with resultant mild canal stenosis. Pituitary gland within normal limits. No acute abnormality about the orbits. Patient is status post bilateral cataract extraction. Scattered mucosal thickening within the paranasal sinuses, likely inflammatory. No air-fluid levels to suggest active sinus infection. No mastoid effusion. Inner ear structures grossly normal. Bone marrow signal intensity within normal limits. No scalp soft tissue abnormality. MRA HEAD FINDINGS ANTERIOR CIRCULATION: Distal cervical segments of the internal carotid arteries are widely patent. Petrous, cavernous, and supraclinoid segments patent without flow limiting stenosis A1 segments patent. Right A1 segment hypoplastic. Anterior communicating artery normal. Anterior cerebral arteries well opacified. M1 segments patent without stenosis or occlusion. MCA bifurcations normal. Distal MCA branches well opacified and symmetric. POSTERIOR CIRCULATION: Vertebral arteries patent to the vertebrobasilar junction. Posterior inferior cerebral arteries patent bilaterally. Basilar artery widely patent. Superior cerebellar arteries patent  bilaterally. Both of the posterior cerebral arteries arise from the basilar artery no well opacified to their distal aspects. IMPRESSION: MRI HEAD IMPRESSION: 1. No acute intracranial infarct or other process identified. 2. Remote lacunar infarct within the ventral right thalamus. 3. Generalized age-related cerebral atrophy with mild chronic microvascular ischemic disease. MRA HEAD IMPRESSION:  Normal intracranial MRA. No large or proximal arterial branch occlusion. No high-grade or correctable stenosis. Electronically Signed   By: Rise Mu M.D.   On: 09/21/2015 00:07   Mr Maxine Glenn Head/brain Wo Cm  09/21/2015  CLINICAL DATA:  Initial evaluation for acute transient right-sided visual loss. EXAM: MRI HEAD WITHOUT CONTRAST MRA HEAD WITHOUT CONTRAST TECHNIQUE: Multiplanar, multiecho pulse sequences of the brain and surrounding structures were obtained without intravenous contrast. Angiographic images of the head were obtained using MRA technique without contrast. COMPARISON:  Prior CT from earlier the same day FINDINGS: MRI HEAD FINDINGS Diffuse prominence of the CSF containing spaces is compatible with generalized age-related cerebral atrophy. Patchy T2/FLAIR hyperintensity within the periventricular, deep, and subcortical white matter both cerebral hemispheres most consistent with chronic small vessel ischemic disease, mild for age. Remote lacunar infarct present within the ventral right thalamus. No remote cortical infarct. No abnormal foci of restricted diffusion to suggest acute infarct. Gray-white matter differentiation maintained. Major intracranial vascular flow voids are preserved. No acute or chronic intracranial hemorrhage. No mass lesion, midline shift, or mass effect. Ventricular prominence related to global parenchymal volume loss without hydrocephalus. No extra-axial fluid collection. Major dural sinuses are grossly patent. Craniocervical junction within normal limits. Degenerative disc bulge noted at C3-4 with resultant mild canal stenosis. Pituitary gland within normal limits. No acute abnormality about the orbits. Patient is status post bilateral cataract extraction. Scattered mucosal thickening within the paranasal sinuses, likely inflammatory. No air-fluid levels to suggest active sinus infection. No mastoid effusion. Inner ear structures grossly normal. Bone marrow signal  intensity within normal limits. No scalp soft tissue abnormality. MRA HEAD FINDINGS ANTERIOR CIRCULATION: Distal cervical segments of the internal carotid arteries are widely patent. Petrous, cavernous, and supraclinoid segments patent without flow limiting stenosis A1 segments patent. Right A1 segment hypoplastic. Anterior communicating artery normal. Anterior cerebral arteries well opacified. M1 segments patent without stenosis or occlusion. MCA bifurcations normal. Distal MCA branches well opacified and symmetric. POSTERIOR CIRCULATION: Vertebral arteries patent to the vertebrobasilar junction. Posterior inferior cerebral arteries patent bilaterally. Basilar artery widely patent. Superior cerebellar arteries patent bilaterally. Both of the posterior cerebral arteries arise from the basilar artery no well opacified to their distal aspects. IMPRESSION: MRI HEAD IMPRESSION: 1. No acute intracranial infarct or other process identified. 2. Remote lacunar infarct within the ventral right thalamus. 3. Generalized age-related cerebral atrophy with mild chronic microvascular ischemic disease. MRA HEAD IMPRESSION: Normal intracranial MRA. No large or proximal arterial branch occlusion. No high-grade or correctable stenosis. Electronically Signed   By: Rise Mu M.D.   On: 09/21/2015 00:07    Microbiology: Recent Results (from the past 240 hour(s))  MRSA PCR Screening     Status: None   Collection Time: 09/23/15  4:20 AM  Result Value Ref Range Status   MRSA by PCR NEGATIVE NEGATIVE Final    Comment:        The GeneXpert MRSA Assay (FDA approved for NASAL specimens only), is one component of a comprehensive MRSA colonization surveillance program. It is not intended to diagnose MRSA infection nor to guide or monitor treatment for MRSA infections.      Labs: Basic Metabolic  Panel:  Recent Labs Lab 09/20/15 1437 09/20/15 1447 09/24/15 0303  NA 140 143 137  K 4.1 4.2 3.7  CL 106 104  107  CO2 27  --  23  GLUCOSE 101* 98 108*  BUN '11 13 11  '$ CREATININE 0.80 0.70 0.67  CALCIUM 9.5  --  8.5*   Liver Function Tests:  Recent Labs Lab 09/20/15 1437  AST 27  ALT 22  ALKPHOS 47  BILITOT 1.2  PROT 6.7  ALBUMIN 3.9   CBC:  Recent Labs Lab 09/20/15 1437 09/20/15 1447 09/24/15 0303  WBC 6.1  --  10.3  NEUTROABS 2.7  --   --   HGB 11.4* 11.6* 9.7*  HCT 34.0* 34.0* 29.5*  MCV 103.0*  --  101.7*  PLT 302  --  272    Signed:  Barton Dubois MD.  [facility] 09/25/2015, 3:01 PM

## 2015-09-25 NOTE — Discharge Instructions (Signed)
Carotid Endarterectomy, Care After  Refer to this sheet in the next few weeks. These instructions provide you with information on caring for yourself after your procedure. Your health care provider may also give you more specific instructions. Your treatment has been planned according to current medical practices, but problems sometimes occur. Call your health care provider if you have any problems or questions after your procedure.   WHAT TO EXPECT AFTER THE PROCEDURE  You may have some pain or an ache in your neck for up to 2 weeks. This is normal. Recovery time varies depending on your age, condition, general health, and other factors. You will likely be able to return to a normal lifestyle within a few weeks.   HOME CARE INSTRUCTIONS   · Take showers if your health care provider approves. Do not take baths, swim, or use a hot tub until your health care provider approves.    · Take medicines only as directed by your health care provider. If a blood thinner (anticoagulant) is prescribed after surgery, take this medicine exactly as directed.  · Change bandages (dressings) as directed by your health care provider.    · Avoid heavy lifting or strenuous activity until your health care provider says it is okay. Resume your normal activities as directed.    · Stop smoking if you smoke. This is a risk factor for poor wound healing.    · Stop taking the pill (oral contraceptives) unless your health care provider recommends otherwise.    · Maintain good control of your blood pressure.    · Exercise regularly or as instructed by your health care provider.    · Eat a heart-healthy diet. Talk to your health care provider about how to lower blood lipids (cholesterol and triglycerides).    · Keep all follow-up visits as directed by your health care provider. Make an appointment for the removal of stitches (sutures) or staples.  SEEK MEDICAL CARE IF:   · You have increased bleeding from the incision site.    · You notice  redness, swelling, or increasing pain at the incision site.    · You notice swelling in your neck or have difficulty breathing or talking.    · You notice a bad smell or pus coming from the incision site or dressing.    · You have a fever.   · You develop a rash.    · You develop any reaction or side effects to medicine given.    SEEK IMMEDIATE MEDICAL CARE IF:   · Your initial symptoms are getting worse rather than better.    · You develop any abnormal bruising or bleeding.    · You have difficulty breathing.    · You develop chest pain, shortness of breath, or pain or swelling in your legs.    · You have a return of symptoms or problems that caused you to have this surgery.    · You develop a temporary loss of vision.    · You develop temporary numbness on one side.    · You develop a temporary inability to speak (aphasia).    · You develop temporary weakness.    MAKE SURE YOU:   · Understand these instructions.  · Will watch your condition.  · Will get help right away if you are not doing well or get worse.     This information is not intended to replace advice given to you by your health care provider. Make sure you discuss any questions you have with your health care provider.     Document Released: 10/27/2004 Document Revised: 04/30/2014 Document Reviewed: 09/10/2012  

## 2015-09-26 ENCOUNTER — Encounter (HOSPITAL_COMMUNITY): Payer: Self-pay | Admitting: Vascular Surgery

## 2015-09-26 ENCOUNTER — Telehealth: Payer: Self-pay | Admitting: Vascular Surgery

## 2015-09-26 NOTE — Telephone Encounter (Signed)
Sched appt 6/13 at 9:45. Spoke to pt's daughter to inform them of appt.

## 2015-09-26 NOTE — Telephone Encounter (Signed)
-----   Message from Mena Goes, RN sent at 09/23/2015  9:30 AM EDT ----- Regarding: schedule   ----- Message -----    From: Gabriel Earing, PA-C    Sent: 09/23/2015   9:24 AM      To: [facility] Charge Pool  S/p right CEA 09/23/15.  F/u with [facility] in 2 weeks.  Thanks, Aldona Bar

## 2015-09-29 ENCOUNTER — Encounter: Payer: Self-pay | Admitting: Vascular Surgery

## 2015-10-04 ENCOUNTER — Encounter: Payer: Self-pay | Admitting: Vascular Surgery

## 2015-10-04 ENCOUNTER — Ambulatory Visit (INDEPENDENT_AMBULATORY_CARE_PROVIDER_SITE_OTHER): Payer: Self-pay | Admitting: Vascular Surgery

## 2015-10-04 VITALS — BP 94/63 | HR 65 | Temp 98.1°F | Resp 14 | Ht 59.0 in | Wt 104.0 lb

## 2015-10-04 DIAGNOSIS — G453 Amaurosis fugax: Secondary | ICD-10-CM | POA: Insufficient documentation

## 2015-10-04 NOTE — Progress Notes (Signed)
Subjective:     Patient ID: Tabitha Harris, female   DOB: Oct 29, 1919, 82 y.o.   MRN: [id number]  HPI this 82 year old female returns for initial follow-up regarding her recent extensive right carotid endarterectomy performed for a discrete symptoms of transient blindness in the right eye preoperatively. She was found to have a 95% right common and internal carotid stenosis and underwent endarterectomy uneventfully. Her biggest complaint today is aching discomfort where the arterial line was inserted in the left wrist because there was difficulty performing this due to calcified vessels she also has had hoarseness since the surgery and had ENT evaluation in the hospital postoperatively which revealed very sluggish movement of the right vocal cord. Patient's daughter and the patient states that the hoarseness has improved significantly since discharge from the hospital. She has no difficulty swallowing. She has had no aspiration. She denies any left-sided weakness or other neurologic symptoms. She is quite active.   Review of Systems     Objective:   Physical Exam BP 94/63 mmHg  Pulse 65  Temp(Src) 98.1 F (36.7 C) (Oral)  Resp 14  Ht 4\' 11"  (1.499 m)  Wt 104 lb (47.174 kg)  BMI 20.99 kg/m2  SpO2 96%  Gen. elderly female alert and oriented 3 in no apparent distress Right neck incision is healing nicely with 3+ carotid pulse and soft bruit Patient does have noticeable hoarseness but no other neurologic abnormalities. Good motion of tongue. No left-sided weakness or facial asymmetry. Vision has been normal since the procedure. Some ecchymosis and left forearm getting radial artery extending up proximally and medially but no hematoma.     Assessment:     Status post extensive right carotid endarterectomy for severe stenosis with transient blindness right eye preoperatively which has resolved Postoperative hoarseness due to right vocal cord paresis-improving Bruising left radial arterial  puncture site for a line-improving    Plan:     Encouraged patient to be patient regarding resolution of hoarseness. Also cautioned her about her swallowing to be certain and go slowly although she's had no swallowing difficulties Encouraged her to increase her activity as tolerated Plan return in 6 months with carotid duplex exam and see Dr. Servando Snare unless patient has problems in the interim

## 2015-10-11 DIAGNOSIS — R490 Dysphonia: Secondary | ICD-10-CM | POA: Diagnosis not present

## 2015-10-11 DIAGNOSIS — J3801 Paralysis of vocal cords and larynx, unilateral: Secondary | ICD-10-CM | POA: Diagnosis not present

## 2015-10-26 DIAGNOSIS — E782 Mixed hyperlipidemia: Secondary | ICD-10-CM | POA: Diagnosis not present

## 2015-10-26 DIAGNOSIS — I10 Essential (primary) hypertension: Secondary | ICD-10-CM | POA: Diagnosis not present

## 2015-10-26 DIAGNOSIS — I350 Nonrheumatic aortic (valve) stenosis: Secondary | ICD-10-CM | POA: Diagnosis not present

## 2015-11-01 DIAGNOSIS — H3411 Central retinal artery occlusion, right eye: Secondary | ICD-10-CM | POA: Diagnosis not present

## 2015-11-02 DIAGNOSIS — I350 Nonrheumatic aortic (valve) stenosis: Secondary | ICD-10-CM | POA: Diagnosis not present

## 2015-11-02 DIAGNOSIS — E782 Mixed hyperlipidemia: Secondary | ICD-10-CM | POA: Diagnosis not present

## 2015-11-02 DIAGNOSIS — I779 Disorder of arteries and arterioles, unspecified: Secondary | ICD-10-CM | POA: Diagnosis not present

## 2015-11-02 DIAGNOSIS — I10 Essential (primary) hypertension: Secondary | ICD-10-CM | POA: Diagnosis not present

## 2015-12-19 ENCOUNTER — Ambulatory Visit (INDEPENDENT_AMBULATORY_CARE_PROVIDER_SITE_OTHER): Payer: Medicare Other | Admitting: Neurology

## 2015-12-19 ENCOUNTER — Encounter: Payer: Self-pay | Admitting: Neurology

## 2015-12-19 VITALS — BP 148/68 | HR 71 | Ht 59.0 in | Wt 105.0 lb

## 2015-12-19 DIAGNOSIS — G453 Amaurosis fugax: Secondary | ICD-10-CM

## 2015-12-19 DIAGNOSIS — I6521 Occlusion and stenosis of right carotid artery: Secondary | ICD-10-CM

## 2015-12-19 NOTE — Patient Instructions (Signed)
I had a long d/w patient about  her recent TIA and carotid stenosis, risk for recurrent stroke/TIAs, personally independently reviewed imaging studies and stroke evaluation results and answered questions.Continue aspirin 81 mg daily  for secondary stroke prevention and maintain strict control of hypertension with blood pressure goal below 130/90, diabetes with hemoglobin A1c goal below 6.5% and lipids with LDL cholesterol goal below 70 mg/dL. I also advised the patient to eat a healthy diet with plenty of whole grains, cereals, fruits and vegetables, exercise regularly and maintain ideal body weight .I expect patient's hoarseness of voice to improve over the next few months and if not she may have to see ENT physician to discuss treatment options. Followup in the future with stroke nurse practitioner in 6 months or call earlier if necessary  Stroke Prevention Some medical conditions and behaviors are associated with an increased chance of having a stroke. You may prevent a stroke by making healthy choices and managing medical conditions. HOW CAN I REDUCE MY RISK OF HAVING A STROKE?   Stay physically active. Get at least 30 minutes of activity on most or all days.  Do not smoke. It may also be helpful to avoid exposure to secondhand smoke.  Limit alcohol use. Moderate alcohol use is considered to be:  No more than 2 drinks per day for men.  No more than 1 drink per day for nonpregnant women.  Eat healthy foods. This involves:  Eating 5 or more servings of fruits and vegetables a day.  Making dietary changes that address high blood pressure (hypertension), high cholesterol, diabetes, or obesity.  Manage your cholesterol levels.  Making food choices that are high in fiber and low in saturated fat, trans fat, and cholesterol may control cholesterol levels.  Take any prescribed medicines to control cholesterol as directed by your health care provider.  Manage your diabetes.  Controlling your  carbohydrate and sugar intake is recommended to manage diabetes.  Take any prescribed medicines to control diabetes as directed by your health care provider.  Control your hypertension.  Making food choices that are low in salt (sodium), saturated fat, trans fat, and cholesterol is recommended to manage hypertension.  Ask your health care provider if you need treatment to lower your blood pressure. Take any prescribed medicines to control hypertension as directed by your health care provider.  If you are 57-12 years of age, have your blood pressure checked every 3-5 years. If you are 1 years of age or older, have your blood pressure checked every year.  Maintain a healthy weight.  Reducing calorie intake and making food choices that are low in sodium, saturated fat, trans fat, and cholesterol are recommended to manage weight.  Stop drug abuse.  Avoid taking birth control pills.  Talk to your health care provider about the risks of taking birth control pills if you are over 28 years old, smoke, get migraines, or have ever had a blood clot.  Get evaluated for sleep disorders (sleep apnea).  Talk to your health care provider about getting a sleep evaluation if you snore a lot or have excessive sleepiness.  Take medicines only as directed by your health care provider.  For some people, aspirin or blood thinners (anticoagulants) are helpful in reducing the risk of forming abnormal blood clots that can lead to stroke. If you have the irregular heart rhythm of atrial fibrillation, you should be on a blood thinner unless there is a good reason you cannot take them.  Understand all  your medicine instructions.  Make sure that other conditions (such as anemia or atherosclerosis) are addressed. SEEK IMMEDIATE MEDICAL CARE IF:   You have sudden weakness or numbness of the face, arm, or leg, especially on one side of the body.  Your face or eyelid droops to one side.  You have sudden  confusion.  You have trouble speaking (aphasia) or understanding.  You have sudden trouble seeing in one or both eyes.  You have sudden trouble walking.  You have dizziness.  You have a loss of balance or coordination.  You have a sudden, severe headache with no known cause.  You have new chest pain or an irregular heartbeat. Any of these symptoms may represent a serious problem that is an emergency. Do not wait to see if the symptoms will go away. Get medical help at once. Call your local emergency services (911 in [place] Do not drive yourself to the hospital.   This information is not intended to replace advice given to you by your health care provider. Make sure you discuss any questions you have with your health care provider.   Document Released: 05/17/2004 Document Revised: 04/30/2014 Document Reviewed: 10/10/2012 Elsevier Interactive Patient Education [employer].

## 2015-12-19 NOTE — Progress Notes (Signed)
[facility] [address] [place]. [place] [address] [phone]       OFFICE FOLLOW-UP NOTE  Ms. Tabitha Harris Date of Birth:  01/02/1920 Medical Record Number:  [id number]   HPI: Tabitha Harris is a pleasant 82 year old Caucasian lady who is seen today for first office follow-up visit following hospital admission for TIA in May 2017.Tabitha Harris is a 81 y.o. female he was in her normal state of health earlier when she had transient right eye visual loss. She states that she was reading something when suddenly the right eye went completely out. She states that it was both sides of the field in the right eye, not the right visual field. She then fell or she saw something kind of move across her vision and then the vision fairly quickly came back. The left so lasted 2-3 minutes. She is completely back to baseline. She has never had anything like this happen before. She was LKW 09/20/2015 in the morning, unclear time. Patient was not administered IV t-PA secondary to resolved symptoms. She was admitted for further evaluation and treatment. Carotid ultrasound suggested 80-99% right ICA stenosis. Transthoracic echo showed normal ejection fraction. LDL cholesterol was 64 mg percent. Hemoglobin A1c was 5.5. Vascular surgery was consulted and did elective right carotid endarterectomy on 09/23/15. Patient developed mild hoarseness of voice post surgery which seems to have persisted. She states her blood pressure is well controlled though it is slightly elevated at [id number] office. She still feels her voice is a little hoarse. She is tolerating aspirin without bleeding or bruising and Lipitor without muscle aches or pains. She has no other new complaints. She plans to see ENT physician in December if hoarseness persists.  ROS:   14 system review of systems is positive for eye itching, murmur, back pain and all other systems negative PMH:  Past Medical History:  Diagnosis Date  . GERD  (gastroesophageal reflux disease)   . Hernia   . History of breast cancer    left  . Hyperlipidemia   . Hypertension   . Stroke ([place])   . Thyroid disease    hypothyroidism    Social History:  Social History   Social History  . Marital status: Widowed    Spouse name: N/A  . Number of children: N/A  . Years of education: N/A   Occupational History  . Not on file.   Social History Main Topics  . Smoking status: Never Smoker  . Smokeless tobacco: Never Used  . Alcohol use 0.6 oz/week    1 Glasses of wine per week     Comment: occasionally  . Drug use: No  . Sexual activity: Not on file   Other Topics Concern  . Not on file   Social History Narrative  . No narrative on file    Medications:   Current Outpatient Prescriptions on File Prior to Visit  Medication Sig Dispense Refill  . acetaminophen (TYLENOL) 500 MG tablet Take 2 tablets (1,000 mg total) by mouth every 8 (eight) hours as needed for fever or headache (pain). 40 tablet 0  . amLODipine (NORVASC) 5 MG tablet Take 5 mg by mouth daily.      Marland Kitchen aspirin 81 MG tablet Take 81 mg by mouth daily.      Marland Kitchen atorvastatin (LIPITOR) 20 MG tablet Take 20 mg by mouth daily.      . bimatoprost (LUMIGAN) 0.01 % SOLN 1 drop at bedtime.    . calcium carbonate (OS-CAL)  600 MG TABS Take 600 mg by mouth 2 (two) times daily with a meal.      . dorzolamide-timolol (COSOPT) 22.3-6.8 MG/ML ophthalmic solution     . esomeprazole (NEXIUM) 40 MG capsule Take 40 mg by mouth daily before breakfast.      . fish oil-omega-3 fatty acids 1000 MG capsule Take 2 g by mouth daily.      Marland Kitchen levothyroxine (SYNTHROID, LEVOTHROID) 75 MCG tablet Take 75 mcg by mouth daily.      . Multiple Vitamins-Minerals (MULTIVITAMIN WITH MINERALS) tablet Take 1 tablet by mouth daily.      . phenol (CHLORASEPTIC) 1.4 % LIQD Use as directed 1 spray in the mouth or throat as needed for throat irritation / pain.  0   No current facility-administered medications on file  prior to visit.     Allergies:   Allergies  Allergen Reactions  . Codeine     Physical Exam General: Frail elderly Caucasian lady., seated, in no evident distress Head: head normocephalic and atraumatic.  Neck: supple with no carotid or supraclavicular bruits Cardiovascular: regular rate and rhythm, no murmurs Musculoskeletal: no deformity. Moderate kyphosis scoliosis Skin:  no rash/petichiae Vascular:  Normal pulses all extremities Vitals:   12/19/15 1633  BP: (!) 148/68  Pulse: 71   Neurologic Exam Mental Status: Awake and fully alert. Oriented to place and time. Recent and remote memory intact. Attention span, concentration and fund of knowledge appropriate. Mood and affect appropriate.  Cranial Nerves: Fundoscopic exam reveals sharp disc margins. Pupils equal, briskly reactive to light. Extraocular movements full without nystagmus. Visual fields full to confrontation. Hearing intact. Facial sensation intact. Face, tongue, palate moves normally and symmetrically. Mild hoarseness of voice. Motor: Normal bulk and tone. Normal strength in all tested extremity muscles. Sensory.: intact to touch ,pinprick .position and vibratory sensation.  Coordination: Rapid alternating movements normal in all extremities. Finger-to-nose and heel-to-shin performed accurately bilaterally. Gait and Station: Arises from chair without difficulty. Stance is normal. Gait demonstrates normal stride length and balance . Able to heel, toe and tandem walk without difficulty.  Reflexes: 1+ and symmetric. Toes downgoing.   NIHSS  1 Modified Rankin  2  ASSESSMENT: 79 year Caucasian lady with episode of amaurosis fugax involving the right on 09/20/15 with symptomatic proximal right ICA stenosis status post right carotid endarterectomy on 09/23/15 was doing well from neurovascular standpoint but has mild hoarseness of voice post surgery. Vascular risk factors of hypertension and hyperlipidemia    PLAN: I had a  long d/w patient about  her recent TIA and carotid stenosis, risk for recurrent stroke/TIAs, personally independently reviewed imaging studies and stroke evaluation results and answered questions.Continue aspirin 81 mg daily  for secondary stroke prevention and maintain strict control of hypertension with blood pressure goal below 130/90, diabetes with hemoglobin A1c goal below 6.5% and lipids with LDL cholesterol goal below 70 mg/dL. I also advised the patient to eat a healthy diet with plenty of whole grains, cereals, fruits and vegetables, exercise regularly and maintain ideal body weight .I expect patient's hoarseness of voice to improve over the next few months and if not she may have to see ENT physician to discuss treatment options. Followup in the future with stroke nurse practitioner in 6 months or call earlier if necessary Greater than 50% of time during this 25 minute visit was spent on counseling,explanation of diagnosis, planning of further management, discussion with patient and family and coordination of care Antony Contras, MD Medical Director [facility]  [facility] Pager: [phone] 12/19/2015 5:17 PM  Note: This document was prepared with digital dictation and possible smart phrase technology. Any transcriptional errors that result from this process are unintentional

## 2016-02-10 DIAGNOSIS — Z23 Encounter for immunization: Secondary | ICD-10-CM | POA: Diagnosis not present

## 2016-02-13 DIAGNOSIS — D485 Neoplasm of uncertain behavior of skin: Secondary | ICD-10-CM | POA: Diagnosis not present

## 2016-02-13 DIAGNOSIS — L821 Other seborrheic keratosis: Secondary | ICD-10-CM | POA: Diagnosis not present

## 2016-02-13 DIAGNOSIS — L570 Actinic keratosis: Secondary | ICD-10-CM | POA: Diagnosis not present

## 2016-02-13 DIAGNOSIS — Z85828 Personal history of other malignant neoplasm of skin: Secondary | ICD-10-CM | POA: Diagnosis not present

## 2016-02-13 DIAGNOSIS — D692 Other nonthrombocytopenic purpura: Secondary | ICD-10-CM | POA: Diagnosis not present

## 2016-02-13 DIAGNOSIS — C44519 Basal cell carcinoma of skin of other part of trunk: Secondary | ICD-10-CM | POA: Diagnosis not present

## 2016-02-13 DIAGNOSIS — D225 Melanocytic nevi of trunk: Secondary | ICD-10-CM | POA: Diagnosis not present

## 2016-04-02 DIAGNOSIS — Z6820 Body mass index (BMI) 20.0-20.9, adult: Secondary | ICD-10-CM | POA: Diagnosis not present

## 2016-04-02 DIAGNOSIS — Z124 Encounter for screening for malignant neoplasm of cervix: Secondary | ICD-10-CM | POA: Diagnosis not present

## 2016-04-06 ENCOUNTER — Ambulatory Visit: Payer: Medicare Other | Admitting: Vascular Surgery

## 2016-04-06 ENCOUNTER — Encounter (HOSPITAL_COMMUNITY): Payer: Medicare Other

## 2016-04-11 DIAGNOSIS — J3801 Paralysis of vocal cords and larynx, unilateral: Secondary | ICD-10-CM | POA: Diagnosis not present

## 2016-04-11 DIAGNOSIS — H9011 Conductive hearing loss, unilateral, right ear, with unrestricted hearing on the contralateral side: Secondary | ICD-10-CM | POA: Diagnosis not present

## 2016-04-11 DIAGNOSIS — R490 Dysphonia: Secondary | ICD-10-CM | POA: Diagnosis not present

## 2016-04-11 DIAGNOSIS — R070 Pain in throat: Secondary | ICD-10-CM | POA: Diagnosis not present

## 2016-04-11 DIAGNOSIS — H6121 Impacted cerumen, right ear: Secondary | ICD-10-CM | POA: Diagnosis not present

## 2016-05-02 DIAGNOSIS — I10 Essential (primary) hypertension: Secondary | ICD-10-CM | POA: Diagnosis not present

## 2016-05-02 DIAGNOSIS — I779 Disorder of arteries and arterioles, unspecified: Secondary | ICD-10-CM | POA: Diagnosis not present

## 2016-05-02 DIAGNOSIS — I350 Nonrheumatic aortic (valve) stenosis: Secondary | ICD-10-CM | POA: Diagnosis not present

## 2016-05-02 DIAGNOSIS — E782 Mixed hyperlipidemia: Secondary | ICD-10-CM | POA: Diagnosis not present

## 2016-05-08 DIAGNOSIS — Z79899 Other long term (current) drug therapy: Secondary | ICD-10-CM | POA: Diagnosis not present

## 2016-05-08 DIAGNOSIS — R5383 Other fatigue: Secondary | ICD-10-CM | POA: Diagnosis not present

## 2016-05-08 DIAGNOSIS — D539 Nutritional anemia, unspecified: Secondary | ICD-10-CM | POA: Diagnosis not present

## 2016-05-15 ENCOUNTER — Encounter: Payer: Self-pay | Admitting: Vascular Surgery

## 2016-05-16 ENCOUNTER — Other Ambulatory Visit: Payer: Self-pay | Admitting: *Deleted

## 2016-05-16 DIAGNOSIS — I350 Nonrheumatic aortic (valve) stenosis: Secondary | ICD-10-CM | POA: Diagnosis not present

## 2016-05-16 DIAGNOSIS — E782 Mixed hyperlipidemia: Secondary | ICD-10-CM | POA: Diagnosis not present

## 2016-05-16 DIAGNOSIS — H409 Unspecified glaucoma: Secondary | ICD-10-CM | POA: Diagnosis not present

## 2016-05-16 DIAGNOSIS — I779 Disorder of arteries and arterioles, unspecified: Secondary | ICD-10-CM | POA: Diagnosis not present

## 2016-05-16 DIAGNOSIS — I6521 Occlusion and stenosis of right carotid artery: Secondary | ICD-10-CM

## 2016-05-18 ENCOUNTER — Ambulatory Visit (HOSPITAL_COMMUNITY)
Admission: RE | Admit: 2016-05-18 | Discharge: 2016-05-18 | Disposition: A | Discharge status: Home / Self Care | Payer: Medicare Other | Source: Ambulatory Visit | Attending: Vascular Surgery | Admitting: Vascular Surgery

## 2016-05-18 ENCOUNTER — Ambulatory Visit (INDEPENDENT_AMBULATORY_CARE_PROVIDER_SITE_OTHER): Payer: Medicare Other | Admitting: Vascular Surgery

## 2016-05-18 ENCOUNTER — Encounter: Payer: Self-pay | Admitting: Vascular Surgery

## 2016-05-18 VITALS — BP 153/72 | HR 70 | Temp 97.4°F | Resp 14 | Ht 59.5 in | Wt 108.0 lb

## 2016-05-18 DIAGNOSIS — I6529 Occlusion and stenosis of unspecified carotid artery: Secondary | ICD-10-CM | POA: Diagnosis not present

## 2016-05-18 DIAGNOSIS — I6522 Occlusion and stenosis of left carotid artery: Secondary | ICD-10-CM | POA: Diagnosis not present

## 2016-05-18 DIAGNOSIS — I6521 Occlusion and stenosis of right carotid artery: Secondary | ICD-10-CM

## 2016-05-18 DIAGNOSIS — Z9889 Other specified postprocedural states: Secondary | ICD-10-CM | POA: Insufficient documentation

## 2016-05-18 DIAGNOSIS — G453 Amaurosis fugax: Secondary | ICD-10-CM | POA: Diagnosis not present

## 2016-05-18 LAB — VAS US CAROTID
LCCADSYS: 124 cm/s
LCCAPDIAS: 12 cm/s
LEFT ECA DIAS: -9 cm/s
LICADDIAS: -17 cm/s
LICADSYS: -108 cm/s
LICAPDIAS: 30 cm/s
LICAPSYS: 152 cm/s
Left CCA dist dias: 19 cm/s
Left CCA prox sys: 76 cm/s
RCCAPSYS: 74 cm/s
RIGHT CCA MID DIAS: 18 cm/s
RIGHT ECA DIAS: -12 cm/s
Right CCA prox dias: 13 cm/s
Right cca dist sys: 185 cm/s

## 2016-05-18 NOTE — Progress Notes (Signed)
Patient ID: Tabitha Harris, female   DOB: 1919-05-08, 83 y.o.   MRN: [id number]  Reason for Consult: Carotid (6 month f/u)   Referred by Tabitha Seashore, MD  Subjective:     HPI:  Tabitha Harris is a 83 y.o. female is here for follow-up from right carotid endarterectomy performed last June. At that time she had a TIA transient loss of vision and then 70% stenosis of the right ICA. Of note on CT angina she also had a stenosis of her left sublabial artery with evidence of retrograde flow on her ultrasound of the left vertebral artery. She now follows up on aspirin she denies any further TIA or stroke symptoms. She walks without assistance and she drove herself to the office today. She has no complete related to today's visit. She did have hoarseness following the procedure that has mostly resolved at this time.  Past Medical History:  Diagnosis Date  . GERD (gastroesophageal reflux disease)   . Hernia   . History of breast cancer    left  . Hyperlipidemia   . Hypertension   . Stroke (Tabitha Harris)   . Thyroid disease    hypothyroidism   Family History  Problem Relation Age of Onset  . Cancer Mother     breast   Past Surgical History:  Procedure Laterality Date  . BREAST LUMPECTOMY  2012   left  . ENDARTERECTOMY Right 09/23/2015   Procedure: ENDARTERECTOMY CAROTID WITH DACRON PATCH ANGIOPLASTY;  Surgeon: Tabitha Misty, MD;  Location: [place];  Service: Vascular;  Laterality: Right;    Short Social History:  Social History  Substance Use Topics  . Smoking status: Never Smoker  . Smokeless tobacco: Never Used  . Alcohol use 0.6 oz/week    1 Glasses of wine per week     Comment: occasionally    Allergies  Allergen Reactions  . Codeine     Current Outpatient Prescriptions  Medication Sig Dispense Refill  . acetaminophen (TYLENOL) 500 MG tablet Take 2 tablets (1,000 mg total) by mouth every 8 (eight) hours as needed for fever or headache (pain). 40 tablet 0  . amLODipine  (NORVASC) 5 MG tablet Take 5 mg by mouth daily.      Marland Kitchen aspirin 81 MG tablet Take 81 mg by mouth daily.      Marland Kitchen atorvastatin (LIPITOR) 20 MG tablet Take 20 mg by mouth daily.      . bimatoprost (LUMIGAN) 0.01 % SOLN 1 drop at bedtime.    . calcium carbonate (OS-CAL) 600 MG TABS Take 600 mg by mouth 2 (two) times daily with a meal.      . dorzolamide-timolol (COSOPT) 22.3-6.8 MG/ML ophthalmic solution     . fish oil-omega-3 fatty acids 1000 MG capsule Take 2 g by mouth daily.      Marland Kitchen levothyroxine (SYNTHROID, LEVOTHROID) 75 MCG tablet Take 75 mcg by mouth daily.      . Multiple Vitamins-Minerals (MULTIVITAMIN WITH MINERALS) tablet Take 1 tablet by mouth daily.      . phenol (CHLORASEPTIC) 1.4 % LIQD Use as directed 1 spray in the mouth or throat as needed for throat irritation / pain.  0  . atorvastatin (LIPITOR) 40 MG tablet     . esomeprazole (NEXIUM) 40 MG capsule Take 40 mg by mouth daily before breakfast.       No current facility-administered medications for this visit.     Review of Systems  Constitutional:  Constitutional negative. HENT: HENT negative.  Eyes: Positive for visual disturbance.   Respiratory: Respiratory negative.  Cardiovascular: Cardiovascular negative.  GI: Gastrointestinal negative.  Musculoskeletal: Musculoskeletal negative.  Skin: Skin negative.  Neurological: Neurological negative.       Objective:  Objective   Vitals:   05/18/16 1005 05/18/16 1010 05/18/16 1011  BP: (!) 124/52 (!) 146/69 (!) 153/72  Pulse: 70 70 70  Resp: 14    Temp: 97.4 F (36.3 C)    SpO2: 100%    Weight: 108 lb (49 kg)    Height: 4' 11.5" (1.511 m)     Body mass index is 21.45 kg/m.  Physical Exam  Constitutional: She is oriented to person, place, and time. She appears well-developed.  HENT:  Head: Normocephalic.  Neck: Normal range of motion.  Cardiovascular: Normal rate.   Pulses:      Radial pulses are 2+ on the right side, and 2+ on the left side.       Femoral  pulses are 2+ on the right side, and 2+ on the left side.      Popliteal pulses are 2+ on the right side, and 2+ on the left side.       Dorsalis pedis pulses are 2+ on the right side, and 2+ on the left side.       Posterior tibial pulses are 2+ on the right side.  Pulmonary/Chest: Effort normal.  Abdominal: She exhibits no mass.  Musculoskeletal: Normal range of motion. She exhibits no edema.  Neurological: She is alert and oriented to person, place, and time. No cranial nerve deficit.  Skin: Skin is warm and dry.  Psychiatric: She has a normal mood and affect. Her behavior is normal. Judgment and thought content normal.    Data: Patent right carotid endarterectomy site with no ICA stenosis. Doppler velocity suggest 1-39% stenosis of the left proximal ICA. There is retrograde vertebral flow with known subclavian artery stenosis from previous CT angina.     Assessment/Plan:    83 year old female history of right carotid endarterectomy for synthetic disease. She now follows up having resolve her hoarseness with a duplex demonstrating patent carotids bilaterally. She will remain on aspirin and follow up in 6 months with repeat carotid duplex at that time we can move her out to one year. Should she have signs or symptoms of stroke or TIA in her interim we will certainly see her sooner.     Tabitha Sandy MD [facility]

## 2016-05-18 NOTE — Progress Notes (Signed)
Vitals:   05/18/16 1005 05/18/16 1010  BP: (!) 124/52 (!) 146/69  Pulse: 70 70  Resp: 14   Temp: 97.4 F (36.3 C)   SpO2: 100%   Weight: 108 lb (49 kg)   Height: 4' 11.5" (1.511 m)

## 2016-05-22 DIAGNOSIS — H401132 Primary open-angle glaucoma, bilateral, moderate stage: Secondary | ICD-10-CM | POA: Diagnosis not present

## 2016-05-22 NOTE — Addendum Note (Signed)
Addended by: Lianne Cure A on: 05/22/2016 03:55 PM   Modules accepted: Orders

## 2016-06-20 ENCOUNTER — Ambulatory Visit: Payer: Medicare Other | Admitting: Nurse Practitioner

## 2016-07-09 ENCOUNTER — Other Ambulatory Visit: Payer: Self-pay | Admitting: Internal Medicine

## 2016-07-09 DIAGNOSIS — Z853 Personal history of malignant neoplasm of breast: Secondary | ICD-10-CM

## 2016-07-13 ENCOUNTER — Ambulatory Visit
Admission: RE | Admit: 2016-07-13 | Discharge: 2016-07-13 | Disposition: A | Discharge status: Home / Self Care | Payer: Medicare Other | Source: Ambulatory Visit | Attending: Internal Medicine | Admitting: Internal Medicine

## 2016-07-13 DIAGNOSIS — R922 Inconclusive mammogram: Secondary | ICD-10-CM | POA: Diagnosis not present

## 2016-07-13 DIAGNOSIS — Z853 Personal history of malignant neoplasm of breast: Secondary | ICD-10-CM

## 2016-08-07 DIAGNOSIS — L570 Actinic keratosis: Secondary | ICD-10-CM | POA: Diagnosis not present

## 2016-08-07 DIAGNOSIS — L821 Other seborrheic keratosis: Secondary | ICD-10-CM | POA: Diagnosis not present

## 2016-08-07 DIAGNOSIS — Z85828 Personal history of other malignant neoplasm of skin: Secondary | ICD-10-CM | POA: Diagnosis not present

## 2016-10-17 DIAGNOSIS — J382 Nodules of vocal cords: Secondary | ICD-10-CM | POA: Diagnosis not present

## 2016-10-17 DIAGNOSIS — R490 Dysphonia: Secondary | ICD-10-CM | POA: Diagnosis not present

## 2016-11-09 ENCOUNTER — Encounter: Payer: Self-pay | Admitting: Family

## 2016-11-16 ENCOUNTER — Ambulatory Visit (HOSPITAL_COMMUNITY)
Admission: RE | Admit: 2016-11-16 | Discharge: 2016-11-16 | Disposition: A | Discharge status: Home / Self Care | Payer: Medicare Other | Source: Ambulatory Visit | Attending: Vascular Surgery | Admitting: Vascular Surgery

## 2016-11-16 ENCOUNTER — Encounter: Payer: Self-pay | Admitting: Family

## 2016-11-16 ENCOUNTER — Ambulatory Visit (INDEPENDENT_AMBULATORY_CARE_PROVIDER_SITE_OTHER): Payer: Medicare Other | Admitting: Family

## 2016-11-16 VITALS — BP 154/80 | HR 66 | Temp 97.5°F | Resp 20 | Ht 59.5 in | Wt 106.0 lb

## 2016-11-16 DIAGNOSIS — I6521 Occlusion and stenosis of right carotid artery: Secondary | ICD-10-CM | POA: Diagnosis not present

## 2016-11-16 DIAGNOSIS — I6529 Occlusion and stenosis of unspecified carotid artery: Secondary | ICD-10-CM | POA: Insufficient documentation

## 2016-11-16 DIAGNOSIS — Z8669 Personal history of other diseases of the nervous system and sense organs: Secondary | ICD-10-CM

## 2016-11-16 DIAGNOSIS — Z9889 Other specified postprocedural states: Secondary | ICD-10-CM | POA: Diagnosis not present

## 2016-11-16 LAB — VAS US CAROTID
LCCADDIAS: 17 cm/s
LCCADSYS: 114 cm/s
LEFT ECA DIAS: -12 cm/s
LICADDIAS: -22 cm/s
LICAPDIAS: -25 cm/s
Left CCA prox dias: 15 cm/s
Left CCA prox sys: 78 cm/s
Left ICA dist sys: -102 cm/s
Left ICA prox sys: -105 cm/s
RCCAPDIAS: 10 cm/s
RIGHT CCA MID DIAS: 15 cm/s
RIGHT ECA DIAS: -6 cm/s
Right CCA prox sys: 57 cm/s
Right cca dist sys: -110 cm/s

## 2016-11-16 NOTE — Patient Instructions (Signed)
Stroke Prevention Some medical conditions and behaviors are associated with an increased chance of having a stroke. You may prevent a stroke by making healthy choices and managing medical conditions. How can I reduce my risk of having a stroke?  Stay physically active. Get at least 30 minutes of activity on most or all days.  Do not smoke. It may also be helpful to avoid exposure to secondhand smoke.  Limit alcohol use. Moderate alcohol use is considered to be:  No more than 2 drinks per day for men.  No more than 1 drink per day for nonpregnant women.  Eat healthy foods. This involves:  Eating 5 or more servings of fruits and vegetables a day.  Making dietary changes that address high blood pressure (hypertension), high cholesterol, diabetes, or obesity.  Manage your cholesterol levels.  Making food choices that are high in fiber and low in saturated fat, trans fat, and cholesterol may control cholesterol levels.  Take any prescribed medicines to control cholesterol as directed by your health care provider.  Manage your diabetes.  Controlling your carbohydrate and sugar intake is recommended to manage diabetes.  Take any prescribed medicines to control diabetes as directed by your health care provider.  Control your hypertension.  Making food choices that are low in salt (sodium), saturated fat, trans fat, and cholesterol is recommended to manage hypertension.  Ask your health care provider if you need treatment to lower your blood pressure. Take any prescribed medicines to control hypertension as directed by your health care provider.  If you are 18-39 years of age, have your blood pressure checked every 3-5 years. If you are 40 years of age or older, have your blood pressure checked every year.  Maintain a healthy weight.  Reducing calorie intake and making food choices that are low in sodium, saturated fat, trans fat, and cholesterol are recommended to manage  weight.  Stop drug abuse.  Avoid taking birth control pills.  Talk to your health care provider about the risks of taking birth control pills if you are over 35 years old, smoke, get migraines, or have ever had a blood clot.  Get evaluated for sleep disorders (sleep apnea).  Talk to your health care provider about getting a sleep evaluation if you snore a lot or have excessive sleepiness.  Take medicines only as directed by your health care provider.  For some people, aspirin or blood thinners (anticoagulants) are helpful in reducing the risk of forming abnormal blood clots that can lead to stroke. If you have the irregular heart rhythm of atrial fibrillation, you should be on a blood thinner unless there is a good reason you cannot take them.  Understand all your medicine instructions.  Make sure that other conditions (such as anemia or atherosclerosis) are addressed. Get help right away if:  You have sudden weakness or numbness of the face, arm, or leg, especially on one side of the body.  Your face or eyelid droops to one side.  You have sudden confusion.  You have trouble speaking (aphasia) or understanding.  You have sudden trouble seeing in one or both eyes.  You have sudden trouble walking.  You have dizziness.  You have a loss of balance or coordination.  You have a sudden, severe headache with no known cause.  You have new chest pain or an irregular heartbeat. Any of these symptoms may represent a serious problem that is an emergency. Do not wait to see if the symptoms will go away.   Get medical help at once. Call your local emergency services (911 in [place] Do not drive yourself to the hospital. This information is not intended to replace advice given to you by your health care provider. Make sure you discuss any questions you have with your health care provider. Document Released: 05/17/2004 Document Revised: 09/15/2015 Document Reviewed: 10/10/2012 Elsevier  Interactive Patient Education  2017 [employer]  

## 2016-11-16 NOTE — Progress Notes (Signed)
Chief Complaint: Follow up Extracranial Carotid Artery Stenosis   History of Present Illness  Tabitha Harris is a 83 y.o. female who returns for follow-up from right carotid endarterectomy performed June of 2017 by Dr. Kellie Simmering. At that time she had a TIA transient loss of vision and then 70% stenosis of the right ICA. Of note on CT angio she also had a stenosis of her left subclavian artery with evidence of retrograde flow on her ultrasound of the left vertebral artery. She denies any further TIA or stroke symptoms.  She walks without assistance and she drove herself to the office today. She did have hoarseness following the procedure that has resolved at this time.  She was last evaluated by Dr. Donzetta Matters on 05-18-16. At that time Dr.Cain advised that pt remain on aspirin and follow up in 6 months with repeat carotid duplex; at that time we can move her out to one year. Should she have signs or symptoms of stroke or TIA in her interim we will certainly see her sooner.  She states her scoliosis has worsened with age, and low back and hips pain limits her walking.   Pt Diabetic: no Pt smoker: non-smoker  Pt meds include: Statin : yes ASA: yes Other anticoagulants/antiplatelets: no   Past Medical History:  Diagnosis Date  . GERD (gastroesophageal reflux disease)   . Hernia   . History of breast cancer    left  . Hyperlipidemia   . Hypertension   . Stroke ([place])   . Thyroid disease    hypothyroidism    Social History Social History  Substance Use Topics  . Smoking status: Never Smoker  . Smokeless tobacco: Never Used  . Alcohol use 0.6 oz/week    1 Glasses of wine per week     Comment: occasionally    Family History Family History  Problem Relation Age of Onset  . Cancer Mother        breast    Surgical History Past Surgical History:  Procedure Laterality Date  . BREAST LUMPECTOMY  2012   left  . ENDARTERECTOMY Right 09/23/2015   Procedure: ENDARTERECTOMY CAROTID WITH  DACRON PATCH ANGIOPLASTY;  Surgeon: Mal Misty, MD;  Location: [facility];  Service: Vascular;  Laterality: Right;    Allergies  Allergen Reactions  . Codeine     Current Outpatient Prescriptions  Medication Sig Dispense Refill  . acetaminophen (TYLENOL) 500 MG tablet Take 2 tablets (1,000 mg total) by mouth every 8 (eight) hours as needed for fever or headache (pain). 40 tablet 0  . amLODipine (NORVASC) 5 MG tablet Take 5 mg by mouth daily.      Marland Kitchen aspirin 81 MG tablet Take 81 mg by mouth daily.      Marland Kitchen atorvastatin (LIPITOR) 20 MG tablet Take 20 mg by mouth daily.      . bimatoprost (LUMIGAN) 0.01 % SOLN 1 drop at bedtime.    . calcium carbonate (OS-CAL) 600 MG TABS Take 600 mg by mouth 2 (two) times daily with a meal.      . dorzolamide-timolol (COSOPT) 22.3-6.8 MG/ML ophthalmic solution     . esomeprazole (NEXIUM) 40 MG capsule Take 40 mg by mouth daily before breakfast.      . fish oil-omega-3 fatty acids 1000 MG capsule Take 2 g by mouth daily.      Marland Kitchen levothyroxine (SYNTHROID, LEVOTHROID) 75 MCG tablet Take 75 mcg by mouth daily.      . Multiple Vitamins-Minerals (PRESERVISION AREDS  2+MULTI VIT PO) Take by mouth.    . phenol (CHLORASEPTIC) 1.4 % LIQD Use as directed 1 spray in the mouth or throat as needed for throat irritation / pain.  0   No current facility-administered medications for this visit.     Review of Systems : See HPI for pertinent positives and negatives.  Physical Examination  Vitals:   11/16/16 1206 11/16/16 1207  BP: 109/65 (!) 154/80  Pulse: 66   Resp: 20   Temp: (!) 97.5 F (36.4 C)   TempSrc: Oral   SpO2: 94%   Weight: 106 lb (48.1 kg)   Height: 4' 11.5" (1.511 m)    Body mass index is 21.05 kg/m.  General: WDWN elderly female in NAD GAIT: normal Eyes: PERRLA Pulmonary:  Respirations are non-labored, good air movement, CTAB, no rales, no rhonchi, wheezing.  Cardiac: regular rhythm, and rate, no detected murmur.  VASCULAR EXAM Carotid Bruits  Right Left   Negative Negative     Abdominal aortic pulse is not palpable. Radial pulses are 2+ palpable and equal.                                                                                                                            LE Pulses Right Left        POPLITEAL  not palpable   not palpable       POSTERIOR TIBIAL  faintly palpable   faintly palpable        DORSALIS PEDIS      ANTERIOR TIBIAL 1+ palpable  1+ palpable     Gastrointestinal: soft, nontender, BS WNL, no r/g, no palpable masses.  Musculoskeletal: Age appropriate muscle atrophy/wasting. M/S 5/5 throughout, extremities without ischemic changes. Notable scoliosis, moderate kyphosis.   Neurologic:  A&O X 3; appropriate affect, sensation is normal; speech is normal, CN 2-12 intact except some hearing loss, pain and light touch intact in extremities, motor exam as listed above.    Assessment: Tabitha Harris is a 83 y.o. female who is s/p right carotid endarterectomy performed June of 2017. She had preoperative right amaurosis fugax, no subsequent TIA or stroke.   She has known left subclavian artery stenosis but is asymptomatic of this, has 2+ palpable bilateral radial pulses, but right brachial systolic pressure is 353, left is 156.  DATA Right ICA: CEA site with no restenosis. Left ICA: 1-39% stenosis. Right vertebral artery flow is antegrade, left is retrograde.  Right subclavian artery waveforms are triphasic, left are biphasic. No significant change compared to the last exam on 05-18-16.    Plan: Follow-up in 1 year with Carotid Duplex scan.   I discussed in depth with the patient the nature of atherosclerosis, and emphasized the importance of maximal medical management including strict control of blood pressure, blood glucose, and lipid levels, obtaining regular exercise, and continued cessation of smoking.  The patient is aware that without maximal medical management the underlying atherosclerotic  disease process will  progress, limiting the benefit of any interventions. The patient was given information about stroke prevention and what symptoms should prompt the patient to seek immediate medical care. Thank you for allowing [place] to participate in this patient's care.  Clemon Chambers, RN, MSN, FNP-C [facility] Office: [phone]  Clinic Physician: Donzetta Matters  11/16/16 12:14 PM

## 2016-11-21 DIAGNOSIS — H401132 Primary open-angle glaucoma, bilateral, moderate stage: Secondary | ICD-10-CM | POA: Diagnosis not present

## 2016-12-05 DIAGNOSIS — E782 Mixed hyperlipidemia: Secondary | ICD-10-CM | POA: Diagnosis not present

## 2016-12-05 DIAGNOSIS — I10 Essential (primary) hypertension: Secondary | ICD-10-CM | POA: Diagnosis not present

## 2016-12-12 DIAGNOSIS — E782 Mixed hyperlipidemia: Secondary | ICD-10-CM | POA: Diagnosis not present

## 2016-12-12 DIAGNOSIS — I350 Nonrheumatic aortic (valve) stenosis: Secondary | ICD-10-CM | POA: Diagnosis not present

## 2016-12-12 DIAGNOSIS — I779 Disorder of arteries and arterioles, unspecified: Secondary | ICD-10-CM | POA: Diagnosis not present

## 2016-12-12 NOTE — Addendum Note (Signed)
Addended by: Lianne Cure A on: 12/12/2016 01:20 PM   Modules accepted: Orders

## 2017-02-08 DIAGNOSIS — L821 Other seborrheic keratosis: Secondary | ICD-10-CM | POA: Diagnosis not present

## 2017-02-08 DIAGNOSIS — L570 Actinic keratosis: Secondary | ICD-10-CM | POA: Diagnosis not present

## 2017-02-08 DIAGNOSIS — L812 Freckles: Secondary | ICD-10-CM | POA: Diagnosis not present

## 2017-02-08 DIAGNOSIS — Z85828 Personal history of other malignant neoplasm of skin: Secondary | ICD-10-CM | POA: Diagnosis not present

## 2017-02-08 DIAGNOSIS — C44529 Squamous cell carcinoma of skin of other part of trunk: Secondary | ICD-10-CM | POA: Diagnosis not present

## 2017-02-08 DIAGNOSIS — D485 Neoplasm of uncertain behavior of skin: Secondary | ICD-10-CM | POA: Diagnosis not present

## 2017-02-14 DIAGNOSIS — Z23 Encounter for immunization: Secondary | ICD-10-CM | POA: Diagnosis not present

## 2017-05-29 DIAGNOSIS — H401132 Primary open-angle glaucoma, bilateral, moderate stage: Secondary | ICD-10-CM | POA: Diagnosis not present

## 2017-06-05 DIAGNOSIS — Z23 Encounter for immunization: Secondary | ICD-10-CM | POA: Diagnosis not present

## 2017-06-05 DIAGNOSIS — I10 Essential (primary) hypertension: Secondary | ICD-10-CM | POA: Diagnosis not present

## 2017-06-05 DIAGNOSIS — Z0000 Encounter for general adult medical examination without abnormal findings: Secondary | ICD-10-CM | POA: Diagnosis not present

## 2017-06-05 DIAGNOSIS — I350 Nonrheumatic aortic (valve) stenosis: Secondary | ICD-10-CM | POA: Diagnosis not present

## 2017-06-05 DIAGNOSIS — E782 Mixed hyperlipidemia: Secondary | ICD-10-CM | POA: Diagnosis not present

## 2017-06-05 DIAGNOSIS — E039 Hypothyroidism, unspecified: Secondary | ICD-10-CM | POA: Diagnosis not present

## 2017-06-05 DIAGNOSIS — I779 Disorder of arteries and arterioles, unspecified: Secondary | ICD-10-CM | POA: Diagnosis not present

## 2017-06-12 DIAGNOSIS — I779 Disorder of arteries and arterioles, unspecified: Secondary | ICD-10-CM | POA: Diagnosis not present

## 2017-06-12 DIAGNOSIS — E782 Mixed hyperlipidemia: Secondary | ICD-10-CM | POA: Diagnosis not present

## 2017-06-12 DIAGNOSIS — I10 Essential (primary) hypertension: Secondary | ICD-10-CM | POA: Diagnosis not present

## 2017-06-12 DIAGNOSIS — R319 Hematuria, unspecified: Secondary | ICD-10-CM | POA: Diagnosis not present

## 2017-06-12 DIAGNOSIS — K228 Other specified diseases of esophagus: Secondary | ICD-10-CM | POA: Diagnosis not present

## 2017-06-12 DIAGNOSIS — I350 Nonrheumatic aortic (valve) stenosis: Secondary | ICD-10-CM | POA: Diagnosis not present

## 2017-06-28 DIAGNOSIS — Z6820 Body mass index (BMI) 20.0-20.9, adult: Secondary | ICD-10-CM | POA: Diagnosis not present

## 2017-06-28 DIAGNOSIS — Z01419 Encounter for gynecological examination (general) (routine) without abnormal findings: Secondary | ICD-10-CM | POA: Diagnosis not present

## 2017-06-28 DIAGNOSIS — N958 Other specified menopausal and perimenopausal disorders: Secondary | ICD-10-CM | POA: Diagnosis not present

## 2017-06-28 DIAGNOSIS — M816 Localized osteoporosis [Lequesne]: Secondary | ICD-10-CM | POA: Diagnosis not present

## 2017-08-01 DIAGNOSIS — L821 Other seborrheic keratosis: Secondary | ICD-10-CM | POA: Diagnosis not present

## 2017-08-01 DIAGNOSIS — C44519 Basal cell carcinoma of skin of other part of trunk: Secondary | ICD-10-CM | POA: Diagnosis not present

## 2017-08-01 DIAGNOSIS — D485 Neoplasm of uncertain behavior of skin: Secondary | ICD-10-CM | POA: Diagnosis not present

## 2017-08-01 DIAGNOSIS — Z85828 Personal history of other malignant neoplasm of skin: Secondary | ICD-10-CM | POA: Diagnosis not present

## 2017-08-01 DIAGNOSIS — D1801 Hemangioma of skin and subcutaneous tissue: Secondary | ICD-10-CM | POA: Diagnosis not present

## 2017-08-30 IMAGING — MR MR MRA HEAD W/O CM
9 of 11 series · 33 of 48 positions shown · non-contrast
Comparison: Prior CT from earlier the same day

CLINICAL DATA: Initial evaluation for acute transient right-sided
visual loss.

EXAM:
MRI HEAD WITHOUT CONTRAST
MRA HEAD WITHOUT CONTRAST
TECHNIQUE: Multiplanar, multiecho pulse sequences of the brain and surrounding
structures were obtained without intravenous contrast. Angiographic
images of the head were obtained using MRA technique without
contrast.

[Series 3: T1 · sagittal · 5.0mm · 0.47mm/px · 1 of 24 slices shown]
[im 1/24]
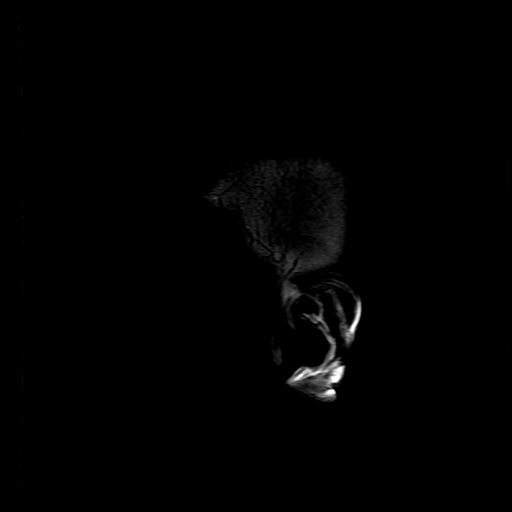

[Series 4: DWI · axial · 3.0mm · 1.09mm/px · z∈[-66,+75]mm · 7 of 96 slices shown (1 of 4)]
[im 1/96]
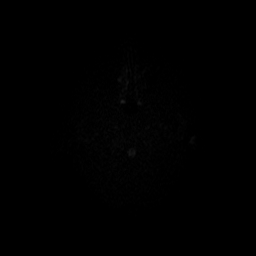
[im 16/96]
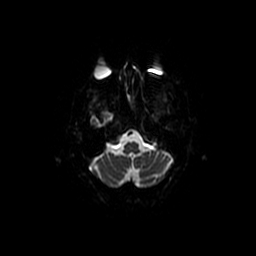
[im 32/96]
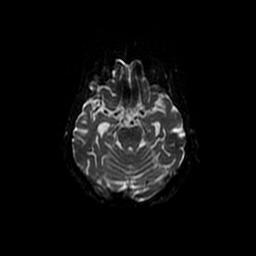
[im 48/96]
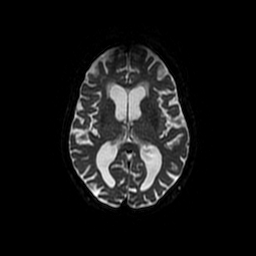
[im 64/96]
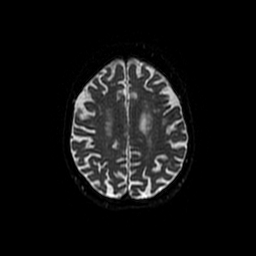
[im 80/96]
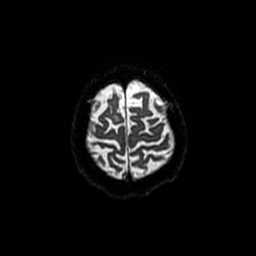
[im 96/96]
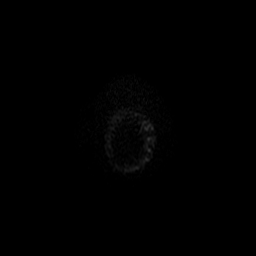

[Series 5: (id) mt fs · axial · 1.4mm · 0.43mm/px · z∈[-63,+3]mm · 6 of 136 slices shown]
[im 1/136]
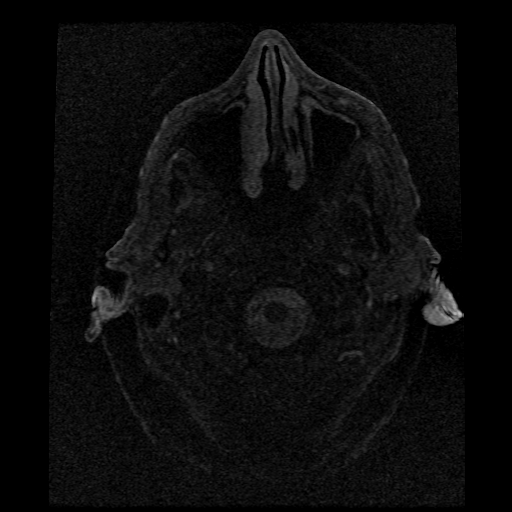
[im 28/136]
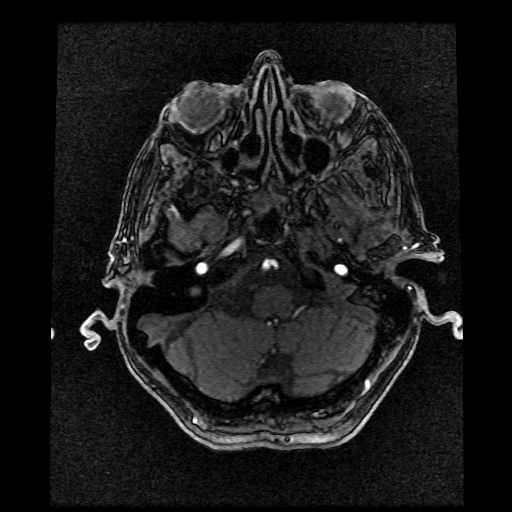
[im 41/136]
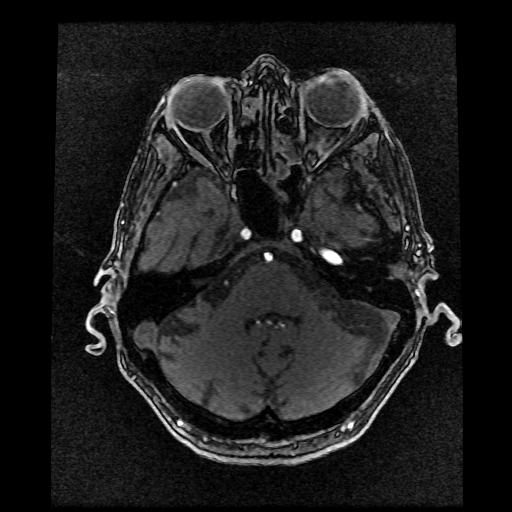
[im 55/136]
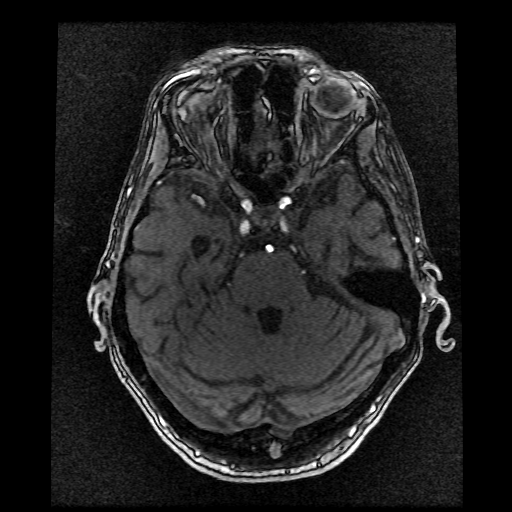
[im 82/136]
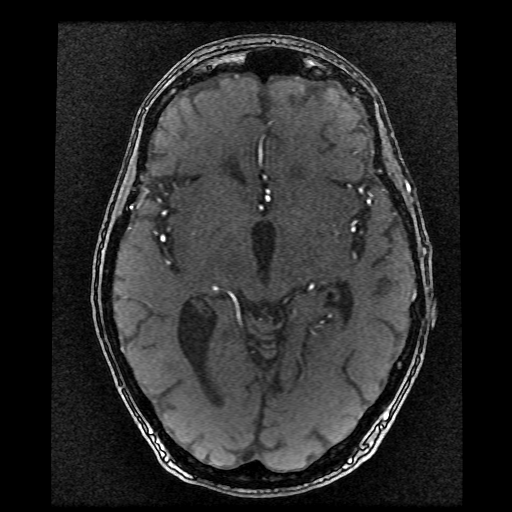
[im 95/136]
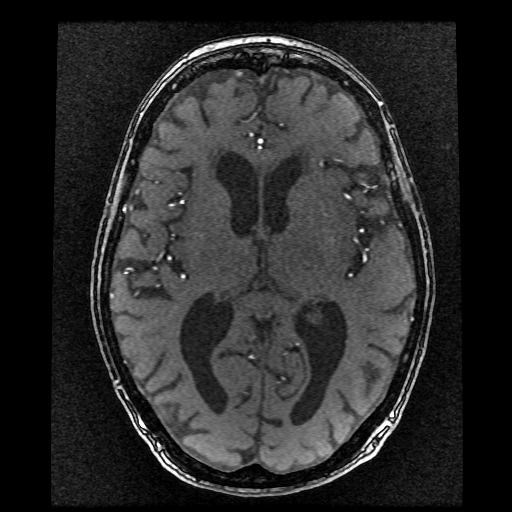

[Series 6: T2 · axial · 5.0mm · 0.43mm/px · z∈[-65,+72]mm · 2 of 24 slices shown (1 of 2)]
[im 1/24]
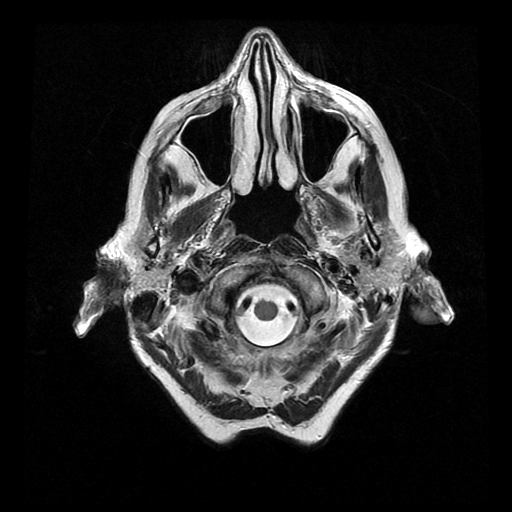
[im 24/24]
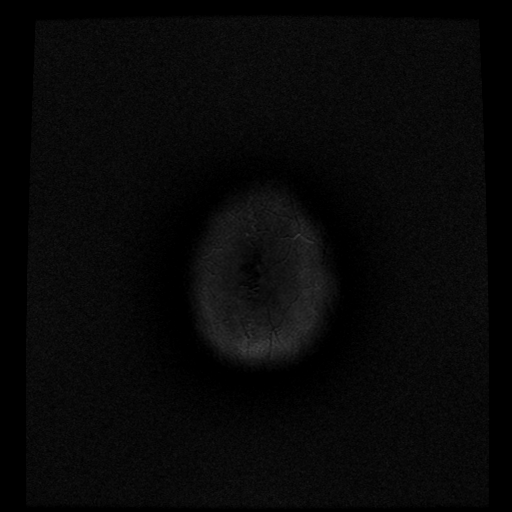

[Series 7: DWI · coronal · 5.0mm · 1.09mm/px · 6 of 74 slices shown (2 of 4)]
[im 1/74]
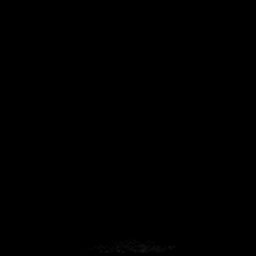
[im 15/74]
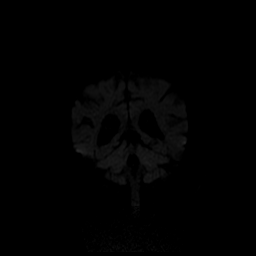
[im 30/74]
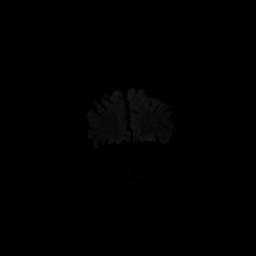
[im 44/74]
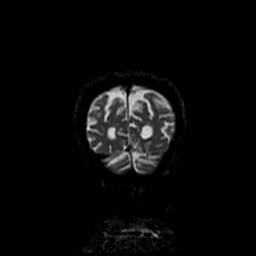
[im 59/74]
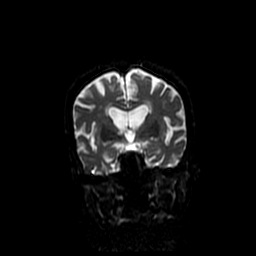
[im 74/74]
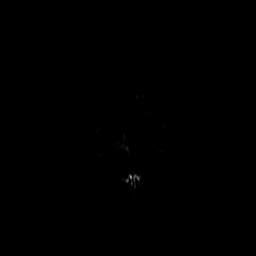

[Series 9: FLAIR · axial · 5.0mm · 0.43mm/px · z∈[-65,+72]mm · 2 of 24 slices shown]
[im 1/24]
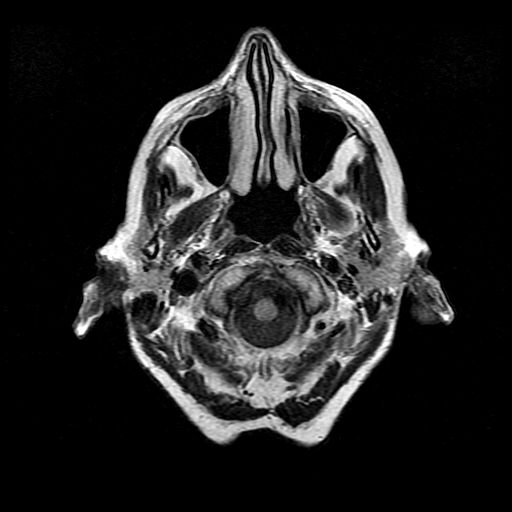
[im 24/24]
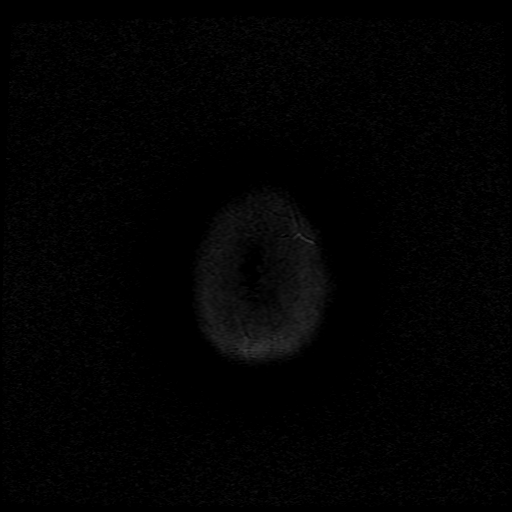

[Series 11: T2 · coronal · 5.0mm · 0.78mm/px · 2 of 28 slices shown (2 of 2)]
[im 1/28]
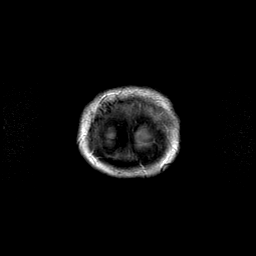
[im 28/28]
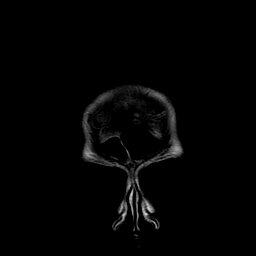

[Series 400: DWI · axial · 3.0mm · 1.09mm/px · z∈[-66,+75]mm · 4 of 48 slices shown (3 of 4)]
[im 1/48]
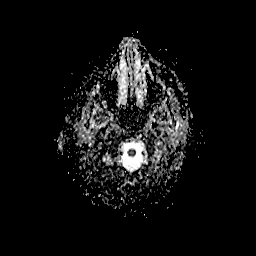
[im 16/48]
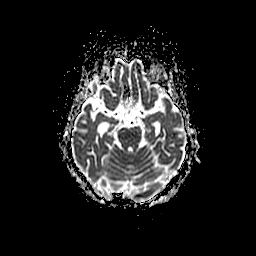
[im 32/48]
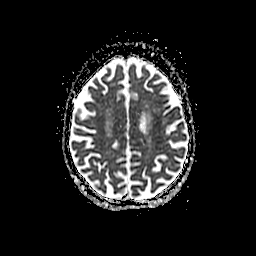
[im 48/48]
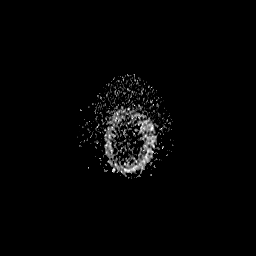

[Series 700: DWI · coronal · 5.0mm · 1.09mm/px · 3 of 37 slices shown (4 of 4)]
[im 1/37]
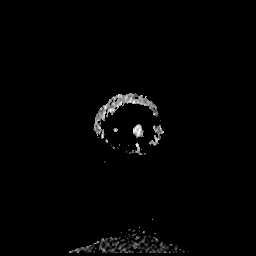
[im 19/37]
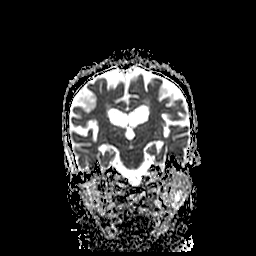
[im 37/37]
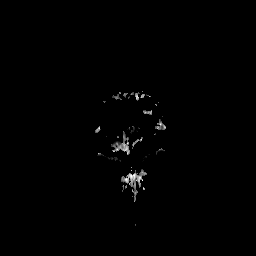

[33 of 48 positions shown; findings below may reference images not displayed]

FINDINGS: MRI HEAD FINDINGS

Diffuse prominence of the CSF containing spaces is compatible with
generalized age-related cerebral atrophy. Patchy T2/FLAIR
hyperintensity within the periventricular, deep, and subcortical
white matter both cerebral hemispheres most consistent with chronic
small vessel ischemic disease, mild for age. Remote lacunar infarct
present within the ventral right thalamus. No remote cortical
infarct.

No abnormal foci of restricted diffusion to suggest acute infarct.
Gray-white matter differentiation maintained. Major intracranial
vascular flow voids are preserved. No acute or chronic intracranial
hemorrhage.

No mass lesion, midline shift, or mass effect. Ventricular
prominence related to global parenchymal volume loss without
hydrocephalus. No extra-axial fluid collection. Major dural sinuses
are grossly patent.

Craniocervical junction within normal limits. Degenerative disc
bulge noted at C3-4 with resultant mild canal stenosis.

Pituitary gland within normal limits. No acute abnormality about the
orbits. Patient is status post bilateral cataract extraction.

Scattered mucosal thickening within the paranasal sinuses, likely
inflammatory. No air-fluid levels to suggest active sinus infection.
No mastoid effusion. Inner ear structures grossly normal.

Bone marrow signal intensity within normal limits. No scalp soft
tissue abnormality.

MRA HEAD FINDINGS

ANTERIOR CIRCULATION:

Distal cervical segments of the internal carotid arteries are widely
patent. Petrous, cavernous, and supraclinoid segments patent without
flow limiting stenosis A1 segments patent. Right A1 segment
hypoplastic. Anterior communicating artery normal. Anterior cerebral
arteries well opacified. M1 segments patent without stenosis or
occlusion. MCA bifurcations normal. Distal MCA branches well
opacified and symmetric.

POSTERIOR CIRCULATION:

Vertebral arteries patent to the vertebrobasilar junction. Posterior
inferior cerebral arteries patent bilaterally. Basilar artery widely
patent. Superior cerebellar arteries patent bilaterally. Both of the
posterior cerebral arteries arise from the basilar artery no well
opacified to their distal aspects.
IMPRESSION: MRI HEAD IMPRESSION:

1. No acute intracranial infarct or other process identified.
2. Remote lacunar infarct within the ventral right thalamus.
3. Generalized age-related cerebral atrophy with mild chronic
microvascular ischemic disease.

MRA HEAD IMPRESSION:

Normal intracranial MRA. No large or proximal arterial branch
occlusion. No high-grade or correctable stenosis.

## 2017-08-31 IMAGING — CT CT ANGIO NECK
2 of 7 series · 8 of 33 positions shown · IV contrast (isovue)
Comparison: MRI brain and MRA head 09/20/2015

CLINICAL DATA: Symptomatic right carotid bifurcations stenosis.

EXAM:
CT ANGIOGRAPHY NECK
TECHNIQUE: Multidetector CT imaging of the neck was performed using the
standard protocol during bolus administration of intravenous
contrast. Multiplanar CT image reconstructions and MIPs were
obtained to evaluate the vascular anatomy. Carotid stenosis
measurements (when applicable) are obtained utilizing NASCET
criteria, using the distal internal carotid diameter as the
denominator.
CONTRAST:  50 mL Isovue 370

[Series 5: cta neck · axial · 0.45mm/px · z∈[-343,-265]mm · 2 of 115 slices shown]
[im 39/115  soft-tissue]
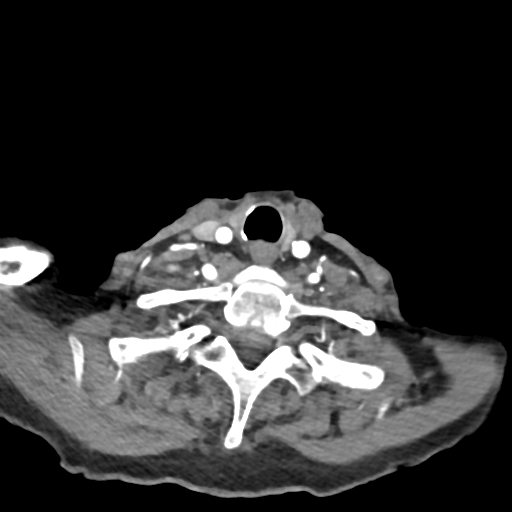
[im 77/115  soft-tissue]
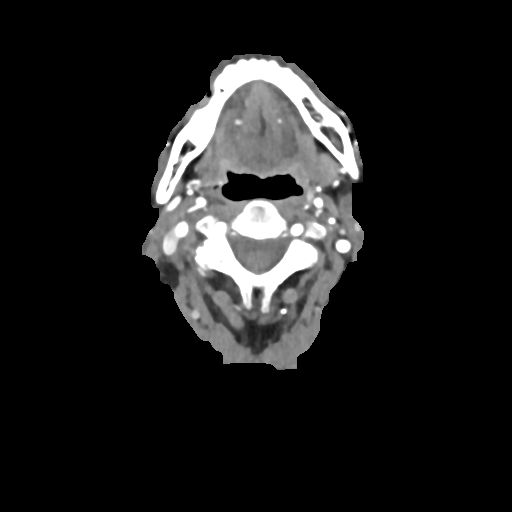

[Series 7: cta neck axial · axial · 0.44mm/px · z∈[-410,-230]mm · 6 of 237 slices shown]
[im 34/237  soft-tissue]
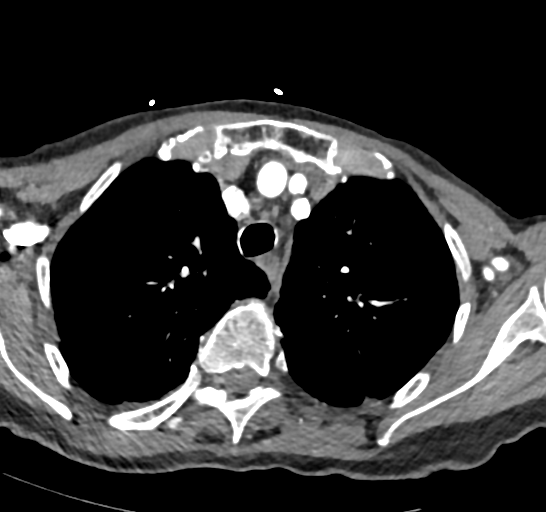
[im 68/237  bone]
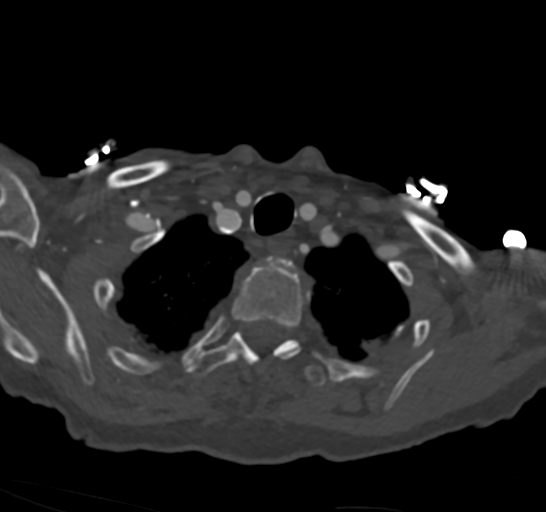
[im 102/237  soft-tissue]
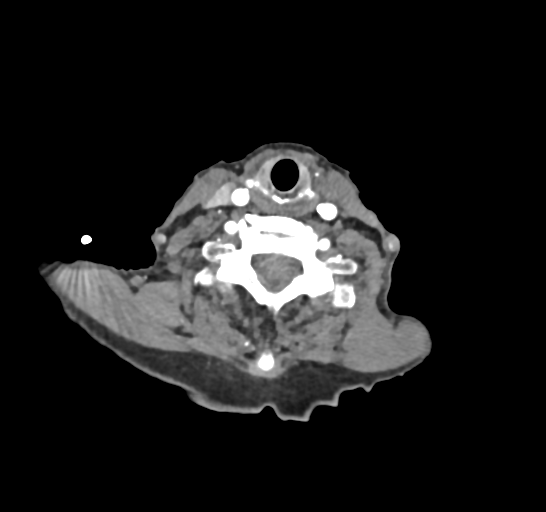
[im 135/237  bone]
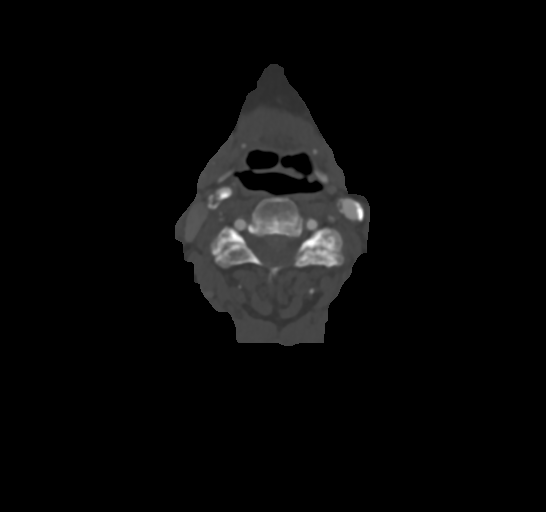
[im 169/237  soft-tissue]
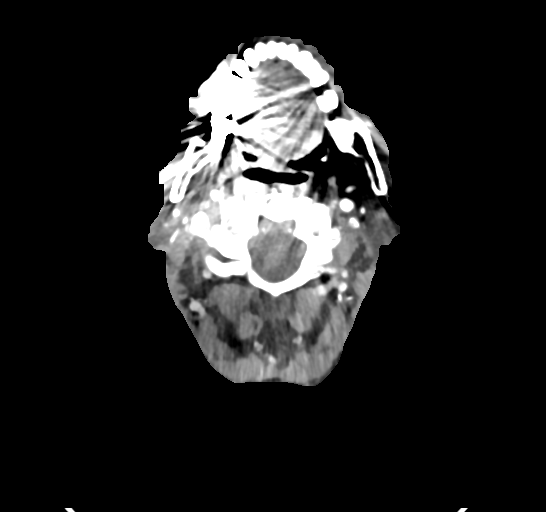
[im 203/237  bone]
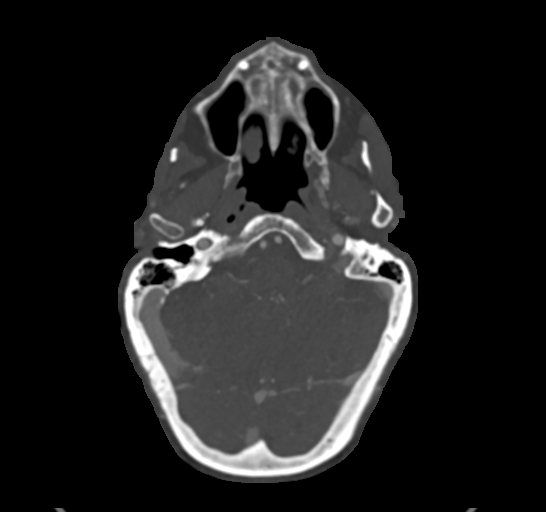

[8 of 33 positions shown; findings below may reference images not displayed]

FINDINGS: Aortic arch: A 3 vessel arch configuration is present. Dense
calcifications are present at the origin of the left subclavian
artery with a near occlusive stenosis. There are calcifications at
the origins of the left common carotid artery and innominate artery
is well without significant stenosis.

Right carotid system: Atherosclerotic calcifications are present
throughout the course of the right common carotid artery, more
prominent distally. Dense calcifications are present at the carotid
bifurcation. The lumen is narrowed to 1 mm. This compares with a
more distal normal luminal measurement of 4.4 mm. There is also some
irregularity of the mid cervical right ICA with a beaded appearance.

Left carotid system: Atherosclerotic calcifications are present
within the left common carotid artery distally without focal
stenosis. Calcified and noncalcified plaque is present at the left
carotid bifurcation without significant stenosis. There is some
regularity of the distal left ICA compatible with fibromuscular
dysplasia.

Vertebral arteries:The min vertebral arteries originate from the
subclavian arteries bilaterally. The left vertebral artery origin is
just above the area of dense calcification. Calcifications are
present in the proximal left vertebral artery at the level of C7.
The right vertebral artery is slightly dominant to the left.

Skeleton: Multilevel degenerative changes are present within the
cervical spine. There is degenerative grade 1 retrolisthesis of 4 mm
at C5-6 and slight anterolisthesis at C7-T1. Endplate degenerative
changes and uncovertebral spurring is present throughout. Osseous
foraminal narrowing is most pronounced on the right at C5-6. No
focal lytic or blastic lesions are present. Mild mucosal disease is
present in the sphenoid sinuses bilaterally.

Other neck: The thyroid is atrophic. No focal lytic or blastic
lesions are present. The salivary glands are within normal limits
bilaterally. No significant cervical adenopathy is present. The
visualized lung apices are clear. There is some pleural parenchymal
scarring.
IMPRESSION: 1. Greater than 70% stenosis of the right internal carotid artery
through the carotid bifurcation.
2. Diffuse atherosclerotic changes within the distal common carotid
arteries and both carotid bifurcations without significant stenosis
on the left.
3. High-grade, near occlusive, stenosis of the proximal left
subclavian artery at the aortic arch. The left vertebral artery
originates just be on the area calcification without significant
stenosis. Of note, flow directionality cannot be determined on the
basis of CT.
4. Noncalcified and vessel irregularity and beading in the mid
cervical internal carotid arteries bilaterally suggesting
fibromuscular dysplasia as well.
5. Multilevel spondylosis of the cervical spine with grade 1
retrolisthesis at C5-6 and slight anterolisthesis at C7-T1. Osseous
foraminal narrowing is present on the right at C5-6.

## 2017-09-25 DIAGNOSIS — E782 Mixed hyperlipidemia: Secondary | ICD-10-CM | POA: Diagnosis not present

## 2017-09-25 DIAGNOSIS — I10 Essential (primary) hypertension: Secondary | ICD-10-CM | POA: Diagnosis not present

## 2017-09-25 DIAGNOSIS — I779 Disorder of arteries and arterioles, unspecified: Secondary | ICD-10-CM | POA: Diagnosis not present

## 2017-10-02 DIAGNOSIS — I350 Nonrheumatic aortic (valve) stenosis: Secondary | ICD-10-CM | POA: Diagnosis not present

## 2017-10-02 DIAGNOSIS — I779 Disorder of arteries and arterioles, unspecified: Secondary | ICD-10-CM | POA: Diagnosis not present

## 2017-10-02 DIAGNOSIS — I10 Essential (primary) hypertension: Secondary | ICD-10-CM | POA: Diagnosis not present

## 2017-10-02 DIAGNOSIS — L292 Pruritus vulvae: Secondary | ICD-10-CM | POA: Diagnosis not present

## 2017-10-02 DIAGNOSIS — E782 Mixed hyperlipidemia: Secondary | ICD-10-CM | POA: Diagnosis not present

## 2017-11-22 ENCOUNTER — Ambulatory Visit (HOSPITAL_COMMUNITY)
Admission: RE | Admit: 2017-11-22 | Discharge: 2017-11-22 | Disposition: A | Discharge status: Home / Self Care | Payer: Medicare Other | Source: Ambulatory Visit | Attending: Family | Admitting: Family

## 2017-11-22 ENCOUNTER — Encounter: Payer: Self-pay | Admitting: Family

## 2017-11-22 ENCOUNTER — Ambulatory Visit (INDEPENDENT_AMBULATORY_CARE_PROVIDER_SITE_OTHER): Payer: Medicare Other | Admitting: Family

## 2017-11-22 VITALS — BP 142/58 | HR 69 | Resp 18 | Ht 59.5 in

## 2017-11-22 DIAGNOSIS — I6523 Occlusion and stenosis of bilateral carotid arteries: Secondary | ICD-10-CM | POA: Insufficient documentation

## 2017-11-22 DIAGNOSIS — I6521 Occlusion and stenosis of right carotid artery: Secondary | ICD-10-CM

## 2017-11-22 DIAGNOSIS — Z8669 Personal history of other diseases of the nervous system and sense organs: Secondary | ICD-10-CM

## 2017-11-22 DIAGNOSIS — Z9889 Other specified postprocedural states: Secondary | ICD-10-CM | POA: Diagnosis not present

## 2017-11-22 NOTE — Progress Notes (Signed)
Chief Complaint: Follow up Extracranial Carotid Artery Stenosis   History of Present Illness  Tabitha Harris is a 84 y.o. female who returns for follow-up from right carotid endarterectomy performed June of 2017 by Dr. Kellie Simmering. At that time she had a TIA transient loss of vision and then 70% stenosis of the right ICA. Of note on CT angio she also had a stenosis of her left subclavian artery with evidence of retrograde flow on her ultrasound of the left vertebral artery. She denies any further TIA or stroke symptoms.  She walks without assistance and she drove herself to the office today. She did have hoarseness following the procedure that has resolved at this time.  She was last evaluated by Dr. Donzetta Matters on 05-18-16. At that time Dr.Cain advised that pt remain on aspirin and follow up in 6 months with repeat carotid duplex; at that time we can move her out to one year. Should she have signs or symptoms of stroke or TIA in her interim we will certainly see her sooner.  She states her scoliosis has worsened with age, and low back and hips pain limits her walking.   Pt Diabetic: no Pt smoker: non-smoker  Pt meds include: Statin : yes ASA: yes Other anticoagulants/antiplatelets: no   Past Medical History:  Diagnosis Date  . GERD (gastroesophageal reflux disease)   . Hernia   . History of breast cancer    left  . Hyperlipidemia   . Hypertension   . Stroke ([place])   . Thyroid disease    hypothyroidism    Social History Social History   Tobacco Use  . Smoking status: Never Smoker  . Smokeless tobacco: Never Used  Substance Use Topics  . Alcohol use: Yes    Alcohol/week: 0.6 oz    Types: 1 Glasses of wine per week    Comment: occasionally  . Drug use: No    Family History Family History  Problem Relation Age of Onset  . Cancer Mother        breast    Surgical History Past Surgical History:  Procedure Laterality Date  . BREAST LUMPECTOMY  2012   left  .  ENDARTERECTOMY Right 09/23/2015   Procedure: ENDARTERECTOMY CAROTID WITH DACRON PATCH ANGIOPLASTY;  Surgeon: Mal Misty, MD;  Location: [facility];  Service: Vascular;  Laterality: Right;    Allergies  Allergen Reactions  . Codeine     Current Outpatient Medications  Medication Sig Dispense Refill  . acetaminophen (TYLENOL) 500 MG tablet Take 2 tablets (1,000 mg total) by mouth every 8 (eight) hours as needed for fever or headache (pain). 40 tablet 0  . amLODipine (NORVASC) 5 MG tablet Take 5 mg by mouth daily.      Marland Kitchen aspirin 81 MG tablet Take 81 mg by mouth daily.      Marland Kitchen atorvastatin (LIPITOR) 20 MG tablet Take 20 mg by mouth daily.      . bimatoprost (LUMIGAN) 0.01 % SOLN 1 drop at bedtime.    . calcium carbonate (OS-CAL) 600 MG TABS Take 600 mg by mouth 2 (two) times daily with a meal.      . dorzolamide-timolol (COSOPT) 22.3-6.8 MG/ML ophthalmic solution     . esomeprazole (NEXIUM) 40 MG capsule Take 40 mg by mouth daily as needed.     . fish oil-omega-3 fatty acids 1000 MG capsule Take 2 g by mouth daily.      Marland Kitchen levothyroxine (SYNTHROID, LEVOTHROID) 75 MCG tablet Take 75 mcg  by mouth daily.      . Multiple Vitamins-Minerals (PRESERVISION AREDS 2+MULTI VIT PO) Take by mouth.    . phenol (CHLORASEPTIC) 1.4 % LIQD Use as directed 1 spray in the mouth or throat as needed for throat irritation / pain.  0   No current facility-administered medications for this visit.     Review of Systems : See HPI for pertinent positives and negatives.  Physical Examination  Vitals:   11/22/17 1232 11/22/17 1234  BP: 115/62 (!) 142/58  Pulse: 69   Resp: 18   SpO2: 99%   Height: 4' 11.5" (1.511 m)    Body mass index is 21.05 kg/m.  General: WDWN elderly female in NAD GAIT: walking with rolling walker HENT: no gross abnormalities  Eyes: PERRLA Pulmonary:  Respirations are non-labored, good air movement, CTAB, no rales, no rhonchi, wheezing. Cardiac: regular rhythm, and rate, no detected  murmur.  VASCULAR EXAM Carotid Bruits Right Left   positive Negative     Abdominal aortic pulse is not palpable. Radial pulses are 2+ palpable and equal.                                                                                                                                          LE Pulses Right Left        POPLITEAL  not palpable   not palpable       POSTERIOR TIBIAL  faintly palpable   faintly palpable        DORSALIS PEDIS      ANTERIOR TIBIAL 1+ palpable  1+ palpable     Gastrointestinal: soft, nontender, BS WNL, no r/g, no palpable masses. Musculoskeletal: Age appropriate muscle atrophy/wasting. M/S 5/5 throughout, extremities without ischemic changes. Notable scoliosis, moderate kyphosis.  Neurologic:  A&O X 3; appropriate affect, sensation is normal; speech is normal, CN 2-12 intact except some hearing loss, pain and light touch intact in extremities, motor exam as listed above. Skin: No rashes, no ulcers, no cellulitis.   Psychiatric: Normal thought content, mood appropriate to clinical situation.    Assessment: Tabitha Harris is a 84 y.o. female who is s/p right carotid endarterectomy performed June of 2017. She had preoperative right amaurosis fugax, no subsequent TIA or stroke.   She has known left subclavian artery stenosis but is asymptomatic of this, has 2+ palpable bilateral radial pulses, but right brachial systolic pressure is 379, left is 156.  She has asymptomatic stenosis in her left subclavian artery; therefore her right arm has the accurate blood pressure.   DATA Carotid Duplex (11-22-17): Right ICA: CEA site with no restenosis. Left ICA: 1-39% stenosis. Right vertebral artery flow is antegrade, left is retrograde.  Right subclavian artery waveforms are multiphasic, left are monophasic. No significant change compared to the last exams on 05-18-16 and 11-16-16.    Plan:  Follow-up in 18 months with Carotid Duplex scan.  I  discussed in depth with the patient the nature of atherosclerosis, and emphasized the importance of maximal medical management including strict control of blood pressure, blood glucose, and lipid levels, obtaining regular exercise, and continued cessation of smoking.  The patient is aware that without maximal medical management the underlying atherosclerotic disease process will progress, limiting the benefit of any interventions. The patient was given information about stroke prevention and what symptoms should prompt the patient to seek immediate medical care. Thank you for allowing [place] to participate in this patient's care.  Clemon Chambers, RN, MSN, FNP-C [facility] Office: [phone]  Clinic Physician: Donzetta Matters  11/22/17 12:53 PM

## 2017-11-22 NOTE — Patient Instructions (Signed)

## 2017-11-26 DIAGNOSIS — H401132 Primary open-angle glaucoma, bilateral, moderate stage: Secondary | ICD-10-CM | POA: Diagnosis not present

## 2018-01-14 ENCOUNTER — Other Ambulatory Visit: Payer: Self-pay | Admitting: Internal Medicine

## 2018-01-14 DIAGNOSIS — Z853 Personal history of malignant neoplasm of breast: Secondary | ICD-10-CM

## 2018-01-14 DIAGNOSIS — N63 Unspecified lump in unspecified breast: Secondary | ICD-10-CM

## 2018-01-22 ENCOUNTER — Ambulatory Visit
Admission: RE | Admit: 2018-01-22 | Discharge: 2018-01-22 | Disposition: A | Discharge status: Home / Self Care | Payer: Medicare Other | Source: Ambulatory Visit | Attending: Internal Medicine | Admitting: Internal Medicine

## 2018-01-22 DIAGNOSIS — Z853 Personal history of malignant neoplasm of breast: Secondary | ICD-10-CM

## 2018-01-22 DIAGNOSIS — R922 Inconclusive mammogram: Secondary | ICD-10-CM | POA: Diagnosis not present

## 2018-01-22 HISTORY — DX: Malignant neoplasm of unspecified site of unspecified female breast: C50.919

## 2018-02-07 DIAGNOSIS — L821 Other seborrheic keratosis: Secondary | ICD-10-CM | POA: Diagnosis not present

## 2018-02-07 DIAGNOSIS — L570 Actinic keratosis: Secondary | ICD-10-CM | POA: Diagnosis not present

## 2018-02-07 DIAGNOSIS — Z85828 Personal history of other malignant neoplasm of skin: Secondary | ICD-10-CM | POA: Diagnosis not present

## 2018-02-07 DIAGNOSIS — I788 Other diseases of capillaries: Secondary | ICD-10-CM | POA: Diagnosis not present

## 2018-02-07 DIAGNOSIS — D692 Other nonthrombocytopenic purpura: Secondary | ICD-10-CM | POA: Diagnosis not present

## 2018-02-07 DIAGNOSIS — D1801 Hemangioma of skin and subcutaneous tissue: Secondary | ICD-10-CM | POA: Diagnosis not present

## 2018-03-28 DIAGNOSIS — Z23 Encounter for immunization: Secondary | ICD-10-CM | POA: Diagnosis not present

## 2018-05-30 DIAGNOSIS — H401132 Primary open-angle glaucoma, bilateral, moderate stage: Secondary | ICD-10-CM | POA: Diagnosis not present

## 2018-05-30 DIAGNOSIS — H01001 Unspecified blepharitis right upper eyelid: Secondary | ICD-10-CM | POA: Diagnosis not present

## 2018-05-30 DIAGNOSIS — H26492 Other secondary cataract, left eye: Secondary | ICD-10-CM | POA: Diagnosis not present

## 2018-06-11 DIAGNOSIS — E782 Mixed hyperlipidemia: Secondary | ICD-10-CM | POA: Diagnosis not present

## 2018-06-11 DIAGNOSIS — I779 Disorder of arteries and arterioles, unspecified: Secondary | ICD-10-CM | POA: Diagnosis not present

## 2018-06-11 DIAGNOSIS — I10 Essential (primary) hypertension: Secondary | ICD-10-CM | POA: Diagnosis not present

## 2018-06-11 DIAGNOSIS — Z0000 Encounter for general adult medical examination without abnormal findings: Secondary | ICD-10-CM | POA: Diagnosis not present

## 2018-06-11 DIAGNOSIS — I350 Nonrheumatic aortic (valve) stenosis: Secondary | ICD-10-CM | POA: Diagnosis not present

## 2018-06-18 DIAGNOSIS — I779 Disorder of arteries and arterioles, unspecified: Secondary | ICD-10-CM | POA: Diagnosis not present

## 2018-06-18 DIAGNOSIS — E782 Mixed hyperlipidemia: Secondary | ICD-10-CM | POA: Diagnosis not present

## 2018-06-18 DIAGNOSIS — N390 Urinary tract infection, site not specified: Secondary | ICD-10-CM | POA: Diagnosis not present

## 2018-06-18 DIAGNOSIS — I10 Essential (primary) hypertension: Secondary | ICD-10-CM | POA: Diagnosis not present

## 2018-06-18 DIAGNOSIS — R1310 Dysphagia, unspecified: Secondary | ICD-10-CM | POA: Diagnosis not present

## 2018-06-18 DIAGNOSIS — I350 Nonrheumatic aortic (valve) stenosis: Secondary | ICD-10-CM | POA: Diagnosis not present

## 2018-06-18 DIAGNOSIS — K228 Other specified diseases of esophagus: Secondary | ICD-10-CM | POA: Diagnosis not present

## 2018-10-07 DIAGNOSIS — L570 Actinic keratosis: Secondary | ICD-10-CM | POA: Diagnosis not present

## 2018-10-07 DIAGNOSIS — Z85828 Personal history of other malignant neoplasm of skin: Secondary | ICD-10-CM | POA: Diagnosis not present

## 2018-10-07 DIAGNOSIS — L821 Other seborrheic keratosis: Secondary | ICD-10-CM | POA: Diagnosis not present

## 2018-10-07 DIAGNOSIS — D692 Other nonthrombocytopenic purpura: Secondary | ICD-10-CM | POA: Diagnosis not present

## 2018-10-07 DIAGNOSIS — L812 Freckles: Secondary | ICD-10-CM | POA: Diagnosis not present

## 2018-10-07 DIAGNOSIS — L218 Other seborrheic dermatitis: Secondary | ICD-10-CM | POA: Diagnosis not present

## 2018-11-20 ENCOUNTER — Other Ambulatory Visit: Payer: Self-pay

## 2019-01-13 DIAGNOSIS — Z23 Encounter for immunization: Secondary | ICD-10-CM | POA: Diagnosis not present

## 2019-01-14 DIAGNOSIS — E782 Mixed hyperlipidemia: Secondary | ICD-10-CM | POA: Diagnosis not present

## 2019-01-14 DIAGNOSIS — I10 Essential (primary) hypertension: Secondary | ICD-10-CM | POA: Diagnosis not present

## 2019-01-21 DIAGNOSIS — I350 Nonrheumatic aortic (valve) stenosis: Secondary | ICD-10-CM | POA: Diagnosis not present

## 2019-01-21 DIAGNOSIS — R319 Hematuria, unspecified: Secondary | ICD-10-CM | POA: Diagnosis not present

## 2019-01-21 DIAGNOSIS — E782 Mixed hyperlipidemia: Secondary | ICD-10-CM | POA: Diagnosis not present

## 2019-01-21 DIAGNOSIS — I10 Essential (primary) hypertension: Secondary | ICD-10-CM | POA: Diagnosis not present

## 2019-01-21 DIAGNOSIS — Z7189 Other specified counseling: Secondary | ICD-10-CM | POA: Diagnosis not present

## 2019-01-21 DIAGNOSIS — I779 Disorder of arteries and arterioles, unspecified: Secondary | ICD-10-CM | POA: Diagnosis not present

## 2019-06-18 DIAGNOSIS — Z23 Encounter for immunization: Secondary | ICD-10-CM | POA: Diagnosis not present

## 2019-07-07 DIAGNOSIS — L812 Freckles: Secondary | ICD-10-CM | POA: Diagnosis not present

## 2019-07-07 DIAGNOSIS — Z85828 Personal history of other malignant neoplasm of skin: Secondary | ICD-10-CM | POA: Diagnosis not present

## 2019-07-07 DIAGNOSIS — L821 Other seborrheic keratosis: Secondary | ICD-10-CM | POA: Diagnosis not present

## 2019-07-08 DIAGNOSIS — I10 Essential (primary) hypertension: Secondary | ICD-10-CM | POA: Diagnosis not present

## 2019-07-08 DIAGNOSIS — I779 Disorder of arteries and arterioles, unspecified: Secondary | ICD-10-CM | POA: Diagnosis not present

## 2019-07-08 DIAGNOSIS — E782 Mixed hyperlipidemia: Secondary | ICD-10-CM | POA: Diagnosis not present

## 2019-07-08 DIAGNOSIS — I350 Nonrheumatic aortic (valve) stenosis: Secondary | ICD-10-CM | POA: Diagnosis not present

## 2019-07-08 DIAGNOSIS — R319 Hematuria, unspecified: Secondary | ICD-10-CM | POA: Diagnosis not present

## 2019-07-08 DIAGNOSIS — Z0000 Encounter for general adult medical examination without abnormal findings: Secondary | ICD-10-CM | POA: Diagnosis not present

## 2019-07-15 DIAGNOSIS — D539 Nutritional anemia, unspecified: Secondary | ICD-10-CM | POA: Diagnosis not present

## 2019-07-15 DIAGNOSIS — I779 Disorder of arteries and arterioles, unspecified: Secondary | ICD-10-CM | POA: Diagnosis not present

## 2019-07-15 DIAGNOSIS — E039 Hypothyroidism, unspecified: Secondary | ICD-10-CM | POA: Diagnosis not present

## 2019-07-15 DIAGNOSIS — I10 Essential (primary) hypertension: Secondary | ICD-10-CM | POA: Diagnosis not present

## 2019-07-15 DIAGNOSIS — D649 Anemia, unspecified: Secondary | ICD-10-CM | POA: Diagnosis not present

## 2019-07-15 DIAGNOSIS — E782 Mixed hyperlipidemia: Secondary | ICD-10-CM | POA: Diagnosis not present

## 2019-07-15 DIAGNOSIS — K228 Other specified diseases of esophagus: Secondary | ICD-10-CM | POA: Diagnosis not present

## 2019-07-15 DIAGNOSIS — R718 Other abnormality of red blood cells: Secondary | ICD-10-CM | POA: Diagnosis not present

## 2019-07-15 DIAGNOSIS — D7589 Other specified diseases of blood and blood-forming organs: Secondary | ICD-10-CM | POA: Diagnosis not present

## 2019-07-15 DIAGNOSIS — I350 Nonrheumatic aortic (valve) stenosis: Secondary | ICD-10-CM | POA: Diagnosis not present

## 2019-07-16 DIAGNOSIS — Z23 Encounter for immunization: Secondary | ICD-10-CM | POA: Diagnosis not present

## 2020-01-13 DIAGNOSIS — D7589 Other specified diseases of blood and blood-forming organs: Secondary | ICD-10-CM | POA: Diagnosis not present

## 2020-01-13 DIAGNOSIS — E039 Hypothyroidism, unspecified: Secondary | ICD-10-CM | POA: Diagnosis not present

## 2020-01-13 DIAGNOSIS — E782 Mixed hyperlipidemia: Secondary | ICD-10-CM | POA: Diagnosis not present

## 2020-01-13 DIAGNOSIS — D539 Nutritional anemia, unspecified: Secondary | ICD-10-CM | POA: Diagnosis not present

## 2020-01-13 DIAGNOSIS — D649 Anemia, unspecified: Secondary | ICD-10-CM | POA: Diagnosis not present

## 2020-01-20 DIAGNOSIS — D7589 Other specified diseases of blood and blood-forming organs: Secondary | ICD-10-CM | POA: Diagnosis not present

## 2020-01-20 DIAGNOSIS — E782 Mixed hyperlipidemia: Secondary | ICD-10-CM | POA: Diagnosis not present

## 2020-01-20 DIAGNOSIS — K228 Other specified diseases of esophagus: Secondary | ICD-10-CM | POA: Diagnosis not present

## 2020-01-20 DIAGNOSIS — D649 Anemia, unspecified: Secondary | ICD-10-CM | POA: Diagnosis not present

## 2020-01-20 DIAGNOSIS — I350 Nonrheumatic aortic (valve) stenosis: Secondary | ICD-10-CM | POA: Diagnosis not present

## 2020-01-20 DIAGNOSIS — E039 Hypothyroidism, unspecified: Secondary | ICD-10-CM | POA: Diagnosis not present

## 2020-01-20 DIAGNOSIS — R0989 Other specified symptoms and signs involving the circulatory and respiratory systems: Secondary | ICD-10-CM | POA: Diagnosis not present

## 2020-01-20 DIAGNOSIS — Z23 Encounter for immunization: Secondary | ICD-10-CM | POA: Diagnosis not present

## 2020-01-20 DIAGNOSIS — I779 Disorder of arteries and arterioles, unspecified: Secondary | ICD-10-CM | POA: Diagnosis not present

## 2020-01-20 DIAGNOSIS — I10 Essential (primary) hypertension: Secondary | ICD-10-CM | POA: Diagnosis not present

## 2020-01-26 DIAGNOSIS — Z23 Encounter for immunization: Secondary | ICD-10-CM | POA: Diagnosis not present

## 2020-03-09 DIAGNOSIS — L308 Other specified dermatitis: Secondary | ICD-10-CM | POA: Diagnosis not present

## 2020-03-09 DIAGNOSIS — L821 Other seborrheic keratosis: Secondary | ICD-10-CM | POA: Diagnosis not present

## 2020-03-09 DIAGNOSIS — L812 Freckles: Secondary | ICD-10-CM | POA: Diagnosis not present

## 2020-03-09 DIAGNOSIS — D1801 Hemangioma of skin and subcutaneous tissue: Secondary | ICD-10-CM | POA: Diagnosis not present

## 2020-03-09 DIAGNOSIS — Z85828 Personal history of other malignant neoplasm of skin: Secondary | ICD-10-CM | POA: Diagnosis not present

## 2020-08-03 DIAGNOSIS — D649 Anemia, unspecified: Secondary | ICD-10-CM | POA: Diagnosis not present

## 2020-08-03 DIAGNOSIS — D7589 Other specified diseases of blood and blood-forming organs: Secondary | ICD-10-CM | POA: Diagnosis not present

## 2020-08-03 DIAGNOSIS — E782 Mixed hyperlipidemia: Secondary | ICD-10-CM | POA: Diagnosis not present

## 2020-08-03 DIAGNOSIS — R0989 Other specified symptoms and signs involving the circulatory and respiratory systems: Secondary | ICD-10-CM | POA: Diagnosis not present

## 2020-08-03 DIAGNOSIS — I10 Essential (primary) hypertension: Secondary | ICD-10-CM | POA: Diagnosis not present

## 2020-08-08 DIAGNOSIS — E538 Deficiency of other specified B group vitamins: Secondary | ICD-10-CM | POA: Diagnosis not present

## 2020-08-10 DIAGNOSIS — D649 Anemia, unspecified: Secondary | ICD-10-CM | POA: Diagnosis not present

## 2020-08-10 DIAGNOSIS — E782 Mixed hyperlipidemia: Secondary | ICD-10-CM | POA: Diagnosis not present

## 2020-08-10 DIAGNOSIS — N182 Chronic kidney disease, stage 2 (mild): Secondary | ICD-10-CM | POA: Diagnosis not present

## 2020-08-10 DIAGNOSIS — D692 Other nonthrombocytopenic purpura: Secondary | ICD-10-CM | POA: Diagnosis not present

## 2020-08-10 DIAGNOSIS — D7589 Other specified diseases of blood and blood-forming organs: Secondary | ICD-10-CM | POA: Diagnosis not present

## 2020-08-10 DIAGNOSIS — I350 Nonrheumatic aortic (valve) stenosis: Secondary | ICD-10-CM | POA: Diagnosis not present

## 2020-08-10 DIAGNOSIS — R0989 Other specified symptoms and signs involving the circulatory and respiratory systems: Secondary | ICD-10-CM | POA: Diagnosis not present

## 2020-08-10 DIAGNOSIS — I10 Essential (primary) hypertension: Secondary | ICD-10-CM | POA: Diagnosis not present

## 2020-08-10 DIAGNOSIS — I779 Disorder of arteries and arterioles, unspecified: Secondary | ICD-10-CM | POA: Diagnosis not present

## 2020-08-17 ENCOUNTER — Telehealth: Payer: Self-pay | Admitting: Hematology and Oncology

## 2020-08-17 NOTE — Telephone Encounter (Signed)
Received a new hem referral from Dr. Ashby Dawes for macrocytosis. Tabitha Harris returned my call and has been scheduled to see Dr. Alvy Bimler on 4/29 at 2pm. Pt aware to arrive 20 minutes early.

## 2020-08-18 DIAGNOSIS — I6523 Occlusion and stenosis of bilateral carotid arteries: Secondary | ICD-10-CM | POA: Diagnosis not present

## 2020-08-18 DIAGNOSIS — I779 Disorder of arteries and arterioles, unspecified: Secondary | ICD-10-CM | POA: Diagnosis not present

## 2020-08-18 DIAGNOSIS — I350 Nonrheumatic aortic (valve) stenosis: Secondary | ICD-10-CM | POA: Diagnosis not present

## 2020-08-19 ENCOUNTER — Telehealth: Payer: Self-pay | Admitting: Hematology and Oncology

## 2020-08-19 ENCOUNTER — Inpatient Hospital Stay: Payer: Medicare Other | Admitting: Hematology and Oncology

## 2020-08-19 NOTE — Telephone Encounter (Signed)
Tabitha Harris called to r/s her appt due to car issues. She has been rescheduled to see Dr. Chryl Heck on 5/11 at 1pm.

## 2020-08-31 ENCOUNTER — Encounter: Payer: Self-pay | Admitting: Hematology and Oncology

## 2020-08-31 ENCOUNTER — Other Ambulatory Visit: Payer: Self-pay

## 2020-08-31 ENCOUNTER — Inpatient Hospital Stay: Payer: Medicare Other | Attending: Hematology and Oncology | Admitting: Hematology and Oncology

## 2020-08-31 ENCOUNTER — Telehealth: Payer: Self-pay | Admitting: Hematology and Oncology

## 2020-08-31 ENCOUNTER — Inpatient Hospital Stay: Payer: Medicare Other

## 2020-08-31 VITALS — BP 155/79 | HR 63 | Temp 97.9°F | Resp 18 | Ht 59.5 in | Wt 98.5 lb

## 2020-08-31 DIAGNOSIS — M7989 Other specified soft tissue disorders: Secondary | ICD-10-CM

## 2020-08-31 DIAGNOSIS — D539 Nutritional anemia, unspecified: Secondary | ICD-10-CM

## 2020-08-31 DIAGNOSIS — R296 Repeated falls: Secondary | ICD-10-CM | POA: Diagnosis not present

## 2020-08-31 DIAGNOSIS — Z853 Personal history of malignant neoplasm of breast: Secondary | ICD-10-CM

## 2020-08-31 DIAGNOSIS — Z803 Family history of malignant neoplasm of breast: Secondary | ICD-10-CM | POA: Diagnosis not present

## 2020-08-31 DIAGNOSIS — M419 Scoliosis, unspecified: Secondary | ICD-10-CM | POA: Diagnosis not present

## 2020-08-31 LAB — RETICULOCYTES
Immature Retic Fract: 23.3 % — ABNORMAL HIGH (ref 2.3–15.9)
RBC.: 3.03 MIL/uL — ABNORMAL LOW (ref 3.87–5.11)
Retic Count, Absolute: 46.1 10*3/uL (ref 19.0–186.0)
Retic Ct Pct: 1.5 % (ref 0.4–3.1)

## 2020-08-31 LAB — CBC WITH DIFFERENTIAL/PLATELET
Abs Immature Granulocytes: 0.02 10*3/uL (ref 0.00–0.07)
Basophils Absolute: 0.1 10*3/uL (ref 0.0–0.1)
Basophils Relative: 1 %
Eosinophils Absolute: 0.1 10*3/uL (ref 0.0–0.5)
Eosinophils Relative: 2 %
HCT: 32.2 % — ABNORMAL LOW (ref 36.0–46.0)
Hemoglobin: 10.9 g/dL — ABNORMAL LOW (ref 12.0–15.0)
Immature Granulocytes: 0 %
Lymphocytes Relative: 32 %
Lymphs Abs: 2.4 10*3/uL (ref 0.7–4.0)
MCH: 35.9 pg — ABNORMAL HIGH (ref 26.0–34.0)
MCHC: 33.9 g/dL (ref 30.0–36.0)
MCV: 105.9 fL — ABNORMAL HIGH (ref 80.0–100.0)
Monocytes Absolute: 0.8 10*3/uL (ref 0.1–1.0)
Monocytes Relative: 11 %
Neutro Abs: 4.1 10*3/uL (ref 1.7–7.7)
Neutrophils Relative %: 54 %
Platelets: 360 10*3/uL (ref 150–400)
RBC: 3.04 MIL/uL — ABNORMAL LOW (ref 3.87–5.11)
RDW: 16.6 % — ABNORMAL HIGH (ref 11.5–15.5)
WBC: 7.5 10*3/uL (ref 4.0–10.5)
nRBC: 0.3 % — ABNORMAL HIGH (ref 0.0–0.2)

## 2020-08-31 LAB — CMP (CANCER CENTER ONLY)
ALT: 29 U/L (ref 0–44)
AST: 33 U/L (ref 15–41)
Albumin: 4.4 g/dL (ref 3.5–5.0)
Alkaline Phosphatase: 61 U/L (ref 38–126)
Anion gap: 7 (ref 5–15)
BUN: 11 mg/dL (ref 8–23)
CO2: 27 mmol/L (ref 22–32)
Calcium: 9.1 mg/dL (ref 8.9–10.3)
Chloride: 106 mmol/L (ref 98–111)
Creatinine: 0.74 mg/dL (ref 0.44–1.00)
GFR, Estimated: >60 mL/min (ref >60)
Glucose, Bld: 94 mg/dL (ref 70–99)
Potassium: 3.7 mmol/L (ref 3.5–5.1)
Sodium: 140 mmol/L (ref 135–145)
Total Bilirubin: 1 mg/dL (ref 0.3–1.2)
Total Protein: 7.7 g/dL (ref 6.5–8.1)

## 2020-08-31 LAB — FERRITIN: Ferritin: 146 ng/mL (ref 11–307)

## 2020-08-31 LAB — IRON AND TIBC
Iron: 153 ug/dL — ABNORMAL HIGH (ref 41–142)
Saturation Ratios: 48 % (ref 21–57)
TIBC: 320 ug/dL (ref 236–444)
UIBC: 167 ug/dL (ref 120–384)

## 2020-08-31 LAB — LACTATE DEHYDROGENASE: LDH: 265 U/L — ABNORMAL HIGH (ref 98–192)

## 2020-08-31 NOTE — Progress Notes (Signed)
[facility] CONSULT NOTE  Patient Care Team: Merrilee Seashore, MD as PCP - General (Internal Medicine)  CHIEF COMPLAINTS/PURPOSE OF CONSULTATION:  Macrocytic anemia, initial consultation  ASSESSMENT & PLAN:  Macrocytic anemia This is a very pleasant 87 year old female patient with past medical history significant for breast cancer, hypertension, dyslipidemia now referred to hematology for further evaluation and recommendations regarding macrocytic anemia.  Patient is very hard of hearing and does not wear hearing aids, she usually likes to read lips during the conversation. She complains of some pain in her throat and in her chest when its about time to eat but otherwise denies any loss of appetite, loss of weight, unusual fatigue or any other health changes.  She denies any known hematochezia or melena.  She has been compliant with her B12 and thyroid medication.  Physical examination, frail appearing 87 year old looks appropriate for her age, no palpable lymphadenopathy or hepatosplenomegaly.  Right lower extremity asymmetrically swollen compared to left lower extremity.  Patient however refused an ultrasound which was offered to evaluate for possible DVT. I reviewed her medications, no obvious reason for macrocytic anemia from a medication.  I reviewed her labs over the past several years, she has had chronic macrocytosis for years which has not essentially changed.  Her MCV back in 2013 was also about 105.  Her last hemoglobin in 2017 was 9.7, most recent hemoglobin recorded at her PCPs office was 9.5 and her repeat hemoglobin today is at 10.9 g/dL which is appropriate for 70 year old. We have discussed about common causes of microcytic anemia which could be secondary to W96 deficiency, folic acid deficiency, hypothyroidism, breast hemolysis leading to reticulocytosis, rarely we do see macrocytic anemia with certain types of myeloma and bone marrow disorder such as myelodysplasia.   Given her mild anemia and her age, I do not see an immediate need for bone marrow aspiration and biopsy.  We have discussed about proceeding with some lab evaluation today, return to clinic in 3 months.  She would like me to telephone her with the results, she did not want to come back for a follow-up to discuss lab results.  She will be informed if any of the labs are surprisingly and appropriate. Thank you for consulting [place] in the care of this patient.  At this time I just recommend surveillance and monitoring, no additional work-up.  All her questions were answered to the best of my knowledge.   Orders Placed This Encounter  Procedures  . CBC with Differential/Platelet    Standing Status:   Standing    Number of Occurrences:   22    Standing Expiration Date:   08/31/2021  . Pathologist smear review    Standing Status:   Future    Number of Occurrences:   1    Standing Expiration Date:   08/31/2021  . Iron and TIBC    Standing Status:   Future    Number of Occurrences:   1    Standing Expiration Date:   08/31/2021  . Ferritin    Standing Status:   Future    Number of Occurrences:   1    Standing Expiration Date:   08/31/2021  . Lactate dehydrogenase    Standing Status:   Future    Number of Occurrences:   1    Standing Expiration Date:   08/31/2021  . Reticulocytes    Standing Status:   Future    Number of Occurrences:   1  Standing Expiration Date:   08/31/2021  . CMP ([facility] only)    Standing Status:   Future    Number of Occurrences:   1    Standing Expiration Date:   08/31/2021  . SPEP with reflex to IFE    Standing Status:   Future    Number of Occurrences:   1    Standing Expiration Date:   08/31/2021  . Kappa/lambda light chains    Standing Status:   Future    Number of Occurrences:   1    Standing Expiration Date:   08/31/2021    HISTORY OF PRESENTING ILLNESS:  Tabitha Harris 87 y.o. female is here because of macrocytic anemia.  Tabitha Harris is a very pleasant  87 year old female patient with past medical history significant for left-sided breast cancer previously seen by Dr. Jana Hakim status post left lumpectomy, final pathology was invasive lobular carcinoma grade 1 measuring 1.5 cm who was started on adjuvant letrozole by Dr. Jana Hakim, it is unclear how long she took the adjuvant antiestrogen therapy, most recent mammogram BI-RADS 2 category referred to hematology for evaluation of macrocytic anemia. [name] arrived to the appointment today by herself.  She cannot hear very well hence tries to read her lips.  She tells me that she does not really feel any different except for some pain in her throat and her chest when it is time to eat or if she goes too long without eating in between her meals.  Besides this, she denies any unusual fatigue, she is still able to do most of her chores, denies any loss of weight although her appetite is minimal. She has not noticed any change in her breathing, bowel habits or urinary habits.  She denies any hematochezia or melena.  She does have some balance issues and reports multiple falls.  She was pretty active up until 4 years ago but given her scoliosis and her limitation in the range of movement, she took it easy.  She does take thyroid medication and B12 medication on a regular basis.  Rest of the pertinent 10 point ROS reviewed and negative.  REVIEW OF SYSTEMS:   Constitutional: Denies fevers, chills or abnormal night sweats Eyes: Denies blurriness of vision, double vision or watery eyes Ears, nose, mouth, throat, and face: Denies mucositis or sore throat Respiratory: Denies cough, dyspnea or wheezes Cardiovascular: Denies palpitation, chest discomfort or lower extremity swelling Gastrointestinal:  Denies nausea, heartburn or change in bowel habits Skin: Denies abnormal skin rashes Lymphatics: Denies new lymphadenopathy or easy bruising Neurological:Denies numbness, tingling or new weaknesses Behavioral/Psych:  Mood is stable, no new changes  All other systems were reviewed with the patient and are negative.  MEDICAL HISTORY:  Past Medical History:  Diagnosis Date  . Breast cancer ([facility])   . GERD (gastroesophageal reflux disease)   . Hernia   . History of breast cancer    left  . Hyperlipidemia   . Hypertension   . Stroke ([facility])   . Thyroid disease    hypothyroidism    SURGICAL HISTORY: Past Surgical History:  Procedure Laterality Date  . BREAST BIOPSY    . BREAST LUMPECTOMY  2012   left  . ENDARTERECTOMY Right 09/23/2015   Procedure: ENDARTERECTOMY CAROTID WITH DACRON PATCH ANGIOPLASTY;  Surgeon: Mal Misty, MD;  Location: [facility];  Service: Vascular;  Laterality: Right;    SOCIAL HISTORY: Social History   Socioeconomic History  . Marital status: Widowed    Spouse  name: Not on file  . Number of children: Not on file  . Years of education: Not on file  . Highest education level: Not on file  Occupational History  . Not on file  Tobacco Use  . Smoking status: Never Smoker  . Smokeless tobacco: Never Used  Substance and Sexual Activity  . Alcohol use: Yes    Alcohol/week: 1.0 standard drink    Types: 1 Glasses of wine per week    Comment: occasionally  . Drug use: No  . Sexual activity: Not on file  Other Topics Concern  . Not on file  Social History Narrative  . Not on file   Social Determinants of Health   Financial Resource Strain: Not on file  Food Insecurity: Not on file  Transportation Needs: Not on file  Physical Activity: Not on file  Stress: Not on file  Social Connections: Not on file  Intimate Partner Violence: Not on file    FAMILY HISTORY: Family History  Problem Relation Age of Onset  . Cancer Mother        breast  . Breast cancer Mother     ALLERGIES:  is allergic to codeine.  MEDICATIONS:  Current Outpatient Medications  Medication Sig Dispense Refill  . acetaminophen (TYLENOL) 500 MG tablet Take 2 tablets (1,000 mg total) by mouth  every 8 (eight) hours as needed for fever or headache (pain). 40 tablet 0  . amLODipine (NORVASC) 5 MG tablet Take 5 mg by mouth daily.    Marland Kitchen aspirin 81 MG tablet Take 81 mg by mouth daily.    Marland Kitchen atorvastatin (LIPITOR) 20 MG tablet Take 20 mg by mouth daily.    . bimatoprost (LUMIGAN) 0.01 % SOLN 1 drop at bedtime.    . calcium carbonate (OS-CAL) 600 MG TABS Take 600 mg by mouth 2 (two) times daily with a meal.    . dorzolamide-timolol (COSOPT) 22.3-6.8 MG/ML ophthalmic solution     . esomeprazole (NEXIUM) 40 MG capsule Take 40 mg by mouth daily as needed.    . fish oil-omega-3 fatty acids 1000 MG capsule Take 2 g by mouth daily.    Marland Kitchen levothyroxine (SYNTHROID, LEVOTHROID) 75 MCG tablet Take 75 mcg by mouth daily.    . Multiple Vitamins-Minerals (PRESERVISION AREDS 2+MULTI VIT PO) Take by mouth.    . phenol (CHLORASEPTIC) 1.4 % LIQD Use as directed 1 spray in the mouth or throat as needed for throat irritation / pain.  0   No current facility-administered medications for this visit.     PHYSICAL EXAMINATION:  ECOG PERFORMANCE STATUS: 0 - Asymptomatic  Vitals:   08/31/20 1300  BP: (!) 155/79  Pulse: 63  Resp: 18  Temp: 97.9 F (36.6 C)  SpO2: 99%   Filed Weights   08/31/20 1300  Weight: 98 lb 8 oz (44.7 kg)    GENERAL:alert, no distress and comfortable, frail appearing in wheelchair.  She says she can walk but she was brought a wheelchair so she used it for today. SKIN: skin color, texture, turgor are normal, no rashes or significant lesions, multiple bruises, senile skin changes EYES: sclera clear, pallor noted. NECK: supple, thyroid normal size, non-tender, without nodularity LYMPH:  no palpable lymphadenopathy in the cervical, axillary or inguinal LUNGS: clear to auscultation and percussion with normal breathing effort HEART: regular rate & rhythm and no murmurs and no lower extremity edema ABDOMEN:abdomen soft, non-tender and normal bowel sounds.  No  hepatosplenomegaly. Musculoskeletal:no cyanosis of digits and no clubbing.  Right lower extremity asymmetrically swollen compared to left lower extremity.  Patient states this is chronic and has been ongoing for months. PSYCH: alert & oriented x 3 with fluent speech NEURO: no focal motor/sensory deficits  LABORATORY DATA:  I have reviewed the data as listed Lab Results  Component Value Date   WBC 7.5 08/31/2020   HGB 10.9 (L) 08/31/2020   HCT 32.2 (L) 08/31/2020   MCV 105.9 (H) 08/31/2020   PLT 360 08/31/2020     Chemistry      Component Value Date/Time   NA 137 09/24/2015 0303   NA 142 02/28/2012 1203   K 3.7 09/24/2015 0303   K 4.5 02/28/2012 1203   CL 107 09/24/2015 0303   CL 109 (H) 02/28/2012 1203   CO2 23 09/24/2015 0303   CO2 29 02/28/2012 1203   BUN 11 09/24/2015 0303   BUN 14.0 02/28/2012 1203   CREATININE 0.67 09/24/2015 0303   CREATININE 0.8 02/28/2012 1203      Component Value Date/Time   CALCIUM 8.5 (L) 09/24/2015 0303   CALCIUM 9.9 02/28/2012 1203   ALKPHOS 47 09/20/2015 1437   ALKPHOS 58 02/28/2012 1203   AST 27 09/20/2015 1437   AST 33 02/28/2012 1203   ALT 22 09/20/2015 1437   ALT 25 02/28/2012 1203   BILITOT 1.2 09/20/2015 1437   BILITOT 0.96 02/28/2012 1203      RADIOGRAPHIC STUDIES: I have personally reviewed the radiological images as listed and agreed with the findings in the report. No results found.  All questions were answered. The patient knows to call the clinic with any problems, questions or concerns. I spent 45 minutes in the care of this patient including H and P, review of records, counseling and coordination of care. We have reviewed her labs over the past several years, discussed about common causes of macrocytic anemia, consideration for bone marrow evaluation and recommendations.    Benay Pike, MD 08/31/2020 2:44 PM

## 2020-08-31 NOTE — Telephone Encounter (Signed)
Scheduled follow-up appointment per 5/11 los. Patient is aware. ?

## 2020-08-31 NOTE — Assessment & Plan Note (Signed)
This is a very pleasant 87 year old female patient with past medical history significant for breast cancer, hypertension, dyslipidemia now referred to hematology for further evaluation and recommendations regarding macrocytic anemia.  Patient is very hard of hearing and does not wear hearing aids, she usually likes to read lips during the conversation. She complains of some pain in her throat and in her chest when its about time to eat but otherwise denies any loss of appetite, loss of weight, unusual fatigue or any other health changes.  She denies any known hematochezia or melena.  She has been compliant with her B12 and thyroid medication.  Physical examination, frail appearing 87 year old looks appropriate for her age, no palpable lymphadenopathy or hepatosplenomegaly.  Right lower extremity asymmetrically swollen compared to left lower extremity.  Patient however refused an ultrasound which was offered to evaluate for possible DVT. I reviewed her medications, no obvious reason for macrocytic anemia from a medication.  I reviewed her labs over the past several years, she has had chronic macrocytosis for years which has not essentially changed.  Her MCV back in 2013 was also about 105.  Her last hemoglobin in 2017 was 9.7, most recent hemoglobin recorded at her PCPs office was 9.5 and her repeat hemoglobin today is at 10.9 g/dL which is appropriate for 21 year old. We have discussed about common causes of microcytic anemia which could be secondary to Z61 deficiency, folic acid deficiency, hypothyroidism, breast hemolysis leading to reticulocytosis, rarely we do see macrocytic anemia with certain types of myeloma and bone marrow disorder such as myelodysplasia.  Given her mild anemia and her age, I do not see an immediate need for bone marrow aspiration and biopsy.  We have discussed about proceeding with some lab evaluation today, return to clinic in 3 months.  She would like me to telephone her with the  results, she did not want to come back for a follow-up to discuss lab results.  She will be informed if any of the labs are surprisingly and appropriate. Thank you for consulting [place] in the care of this patient.  At this time I just recommend surveillance and monitoring, no additional work-up.  All her questions were answered to the best of my knowledge.

## 2020-09-01 LAB — PATHOLOGIST SMEAR REVIEW

## 2020-09-01 LAB — KAPPA/LAMBDA LIGHT CHAINS
Kappa free light chain: 15.7 mg/L (ref 3.3–19.4)
Kappa, lambda light chain ratio: 1.4 (ref 0.26–1.65)
Lambda free light chains: 11.2 mg/L (ref 5.7–26.3)

## 2020-09-06 LAB — IMMUNOFIXATION REFLEX, SERUM
IgA: 163 mg/dL (ref 64–422)
IgG (Immunoglobin G), Serum: 1198 mg/dL (ref 586–1602)
IgM (Immunoglobulin M), Srm: 120 mg/dL (ref 26–217)

## 2020-09-06 LAB — PROTEIN ELECTROPHORESIS, SERUM, WITH REFLEX
A/G Ratio: 1.5 (ref 0.7–1.7)
Albumin ELP: 4.3 g/dL (ref 2.9–4.4)
Alpha-1-Globulin: 0.2 g/dL (ref 0.0–0.4)
Alpha-2-Globulin: 0.6 g/dL (ref 0.4–1.0)
Beta Globulin: 0.8 g/dL (ref 0.7–1.3)
Gamma Globulin: 1.2 g/dL (ref 0.4–1.8)
Globulin, Total: 2.9 g/dL (ref 2.2–3.9)
M-Spike, %: 0.4 g/dL — ABNORMAL HIGH
SPEP Interpretation: 0
Total Protein ELP: 7.2 g/dL (ref 6.0–8.5)

## 2020-11-05 DIAGNOSIS — Z23 Encounter for immunization: Secondary | ICD-10-CM | POA: Diagnosis not present

## 2020-11-25 DIAGNOSIS — H353211 Exudative age-related macular degeneration, right eye, with active choroidal neovascularization: Secondary | ICD-10-CM | POA: Diagnosis not present

## 2020-11-25 DIAGNOSIS — H401132 Primary open-angle glaucoma, bilateral, moderate stage: Secondary | ICD-10-CM | POA: Diagnosis not present

## 2020-12-01 ENCOUNTER — Ambulatory Visit: Payer: Medicare Other | Admitting: Hematology and Oncology

## 2020-12-14 DIAGNOSIS — H401132 Primary open-angle glaucoma, bilateral, moderate stage: Secondary | ICD-10-CM | POA: Diagnosis not present

## 2020-12-14 DIAGNOSIS — H34831 Tributary (branch) retinal vein occlusion, right eye, with macular edema: Secondary | ICD-10-CM | POA: Diagnosis not present

## 2020-12-14 DIAGNOSIS — H353132 Nonexudative age-related macular degeneration, bilateral, intermediate dry stage: Secondary | ICD-10-CM | POA: Diagnosis not present

## 2020-12-15 ENCOUNTER — Inpatient Hospital Stay: Payer: Medicare Other | Admitting: Hematology and Oncology

## 2021-02-01 DIAGNOSIS — N182 Chronic kidney disease, stage 2 (mild): Secondary | ICD-10-CM | POA: Diagnosis not present

## 2021-02-01 DIAGNOSIS — D692 Other nonthrombocytopenic purpura: Secondary | ICD-10-CM | POA: Diagnosis not present

## 2021-02-01 DIAGNOSIS — E782 Mixed hyperlipidemia: Secondary | ICD-10-CM | POA: Diagnosis not present

## 2021-02-01 DIAGNOSIS — I10 Essential (primary) hypertension: Secondary | ICD-10-CM | POA: Diagnosis not present

## 2021-02-01 DIAGNOSIS — D7589 Other specified diseases of blood and blood-forming organs: Secondary | ICD-10-CM | POA: Diagnosis not present

## 2021-02-01 DIAGNOSIS — E039 Hypothyroidism, unspecified: Secondary | ICD-10-CM | POA: Diagnosis not present

## 2021-02-01 DIAGNOSIS — I779 Disorder of arteries and arterioles, unspecified: Secondary | ICD-10-CM | POA: Diagnosis not present

## 2021-02-01 DIAGNOSIS — R0989 Other specified symptoms and signs involving the circulatory and respiratory systems: Secondary | ICD-10-CM | POA: Diagnosis not present

## 2021-02-01 DIAGNOSIS — I350 Nonrheumatic aortic (valve) stenosis: Secondary | ICD-10-CM | POA: Diagnosis not present

## 2021-02-01 DIAGNOSIS — D649 Anemia, unspecified: Secondary | ICD-10-CM | POA: Diagnosis not present

## 2021-02-01 DIAGNOSIS — Z23 Encounter for immunization: Secondary | ICD-10-CM | POA: Diagnosis not present

## 2021-05-31 DIAGNOSIS — D692 Other nonthrombocytopenic purpura: Secondary | ICD-10-CM | POA: Diagnosis not present

## 2021-05-31 DIAGNOSIS — L565 Disseminated superficial actinic porokeratosis (DSAP): Secondary | ICD-10-CM | POA: Diagnosis not present

## 2021-05-31 DIAGNOSIS — L570 Actinic keratosis: Secondary | ICD-10-CM | POA: Diagnosis not present

## 2021-05-31 DIAGNOSIS — C44319 Basal cell carcinoma of skin of other parts of face: Secondary | ICD-10-CM | POA: Diagnosis not present

## 2021-05-31 DIAGNOSIS — L821 Other seborrheic keratosis: Secondary | ICD-10-CM | POA: Diagnosis not present

## 2021-05-31 DIAGNOSIS — D485 Neoplasm of uncertain behavior of skin: Secondary | ICD-10-CM | POA: Diagnosis not present

## 2021-05-31 DIAGNOSIS — Z85828 Personal history of other malignant neoplasm of skin: Secondary | ICD-10-CM | POA: Diagnosis not present

## 2021-08-03 DIAGNOSIS — Z85828 Personal history of other malignant neoplasm of skin: Secondary | ICD-10-CM | POA: Diagnosis not present

## 2021-08-03 DIAGNOSIS — D692 Other nonthrombocytopenic purpura: Secondary | ICD-10-CM | POA: Diagnosis not present

## 2021-11-23 ENCOUNTER — Encounter (HOSPITAL_COMMUNITY): Payer: Self-pay

## 2021-11-23 ENCOUNTER — Other Ambulatory Visit: Payer: Self-pay

## 2021-11-23 ENCOUNTER — Inpatient Hospital Stay (HOSPITAL_COMMUNITY)
Admission: EM | Admit: 2021-11-23 | Discharge: 2021-11-28 | DRG: 683 | Disposition: A | Discharge status: Skilled Nursing Facility | Payer: Medicare Other | Attending: Internal Medicine | Admitting: Internal Medicine

## 2021-11-23 ENCOUNTER — Emergency Department (HOSPITAL_COMMUNITY): Payer: Medicare Other

## 2021-11-23 DIAGNOSIS — S81811A Laceration without foreign body, right lower leg, initial encounter: Secondary | ICD-10-CM | POA: Diagnosis not present

## 2021-11-23 DIAGNOSIS — S41112A Laceration without foreign body of left upper arm, initial encounter: Secondary | ICD-10-CM | POA: Diagnosis present

## 2021-11-23 DIAGNOSIS — Z8673 Personal history of transient ischemic attack (TIA), and cerebral infarction without residual deficits: Secondary | ICD-10-CM

## 2021-11-23 DIAGNOSIS — E785 Hyperlipidemia, unspecified: Secondary | ICD-10-CM | POA: Diagnosis present

## 2021-11-23 DIAGNOSIS — I3481 Nonrheumatic mitral (valve) annulus calcification: Secondary | ICD-10-CM | POA: Diagnosis present

## 2021-11-23 DIAGNOSIS — R293 Abnormal posture: Secondary | ICD-10-CM | POA: Diagnosis not present

## 2021-11-23 DIAGNOSIS — Z7401 Bed confinement status: Secondary | ICD-10-CM | POA: Diagnosis not present

## 2021-11-23 DIAGNOSIS — W19XXXD Unspecified fall, subsequent encounter: Secondary | ICD-10-CM | POA: Diagnosis not present

## 2021-11-23 DIAGNOSIS — L89156 Pressure-induced deep tissue damage of sacral region: Secondary | ICD-10-CM | POA: Diagnosis not present

## 2021-11-23 DIAGNOSIS — R2681 Unsteadiness on feet: Secondary | ICD-10-CM | POA: Diagnosis not present

## 2021-11-23 DIAGNOSIS — I5032 Chronic diastolic (congestive) heart failure: Secondary | ICD-10-CM | POA: Diagnosis present

## 2021-11-23 DIAGNOSIS — Y92009 Unspecified place in unspecified non-institutional (private) residence as the place of occurrence of the external cause: Secondary | ICD-10-CM | POA: Diagnosis not present

## 2021-11-23 DIAGNOSIS — I350 Nonrheumatic aortic (valve) stenosis: Secondary | ICD-10-CM | POA: Diagnosis not present

## 2021-11-23 DIAGNOSIS — R410 Disorientation, unspecified: Secondary | ICD-10-CM | POA: Diagnosis not present

## 2021-11-23 DIAGNOSIS — Z66 Do not resuscitate: Secondary | ICD-10-CM | POA: Diagnosis present

## 2021-11-23 DIAGNOSIS — R0902 Hypoxemia: Secondary | ICD-10-CM | POA: Diagnosis not present

## 2021-11-23 DIAGNOSIS — Z23 Encounter for immunization: Secondary | ICD-10-CM

## 2021-11-23 DIAGNOSIS — E860 Dehydration: Secondary | ICD-10-CM | POA: Diagnosis present

## 2021-11-23 DIAGNOSIS — Z79899 Other long term (current) drug therapy: Secondary | ICD-10-CM

## 2021-11-23 DIAGNOSIS — S22080A Wedge compression fracture of T11-T12 vertebra, initial encounter for closed fracture: Secondary | ICD-10-CM | POA: Diagnosis not present

## 2021-11-23 DIAGNOSIS — D539 Nutritional anemia, unspecified: Secondary | ICD-10-CM | POA: Diagnosis not present

## 2021-11-23 DIAGNOSIS — L8990 Pressure ulcer of unspecified site, unspecified stage: Secondary | ICD-10-CM | POA: Insufficient documentation

## 2021-11-23 DIAGNOSIS — M6282 Rhabdomyolysis: Secondary | ICD-10-CM | POA: Diagnosis present

## 2021-11-23 DIAGNOSIS — G319 Degenerative disease of nervous system, unspecified: Secondary | ICD-10-CM | POA: Diagnosis not present

## 2021-11-23 DIAGNOSIS — R338 Other retention of urine: Secondary | ICD-10-CM

## 2021-11-23 DIAGNOSIS — J929 Pleural plaque without asbestos: Secondary | ICD-10-CM | POA: Diagnosis not present

## 2021-11-23 DIAGNOSIS — R262 Difficulty in walking, not elsewhere classified: Secondary | ICD-10-CM | POA: Diagnosis not present

## 2021-11-23 DIAGNOSIS — M25559 Pain in unspecified hip: Secondary | ICD-10-CM | POA: Diagnosis not present

## 2021-11-23 DIAGNOSIS — S22050A Wedge compression fracture of T5-T6 vertebra, initial encounter for closed fracture: Secondary | ICD-10-CM | POA: Diagnosis not present

## 2021-11-23 DIAGNOSIS — K219 Gastro-esophageal reflux disease without esophagitis: Secondary | ICD-10-CM | POA: Diagnosis not present

## 2021-11-23 DIAGNOSIS — S3992XA Unspecified injury of lower back, initial encounter: Secondary | ICD-10-CM | POA: Diagnosis not present

## 2021-11-23 DIAGNOSIS — I959 Hypotension, unspecified: Secondary | ICD-10-CM | POA: Diagnosis not present

## 2021-11-23 DIAGNOSIS — R64 Cachexia: Secondary | ICD-10-CM | POA: Diagnosis present

## 2021-11-23 DIAGNOSIS — W19XXXA Unspecified fall, initial encounter: Secondary | ICD-10-CM

## 2021-11-23 DIAGNOSIS — I110 Hypertensive heart disease with heart failure: Secondary | ICD-10-CM | POA: Diagnosis present

## 2021-11-23 DIAGNOSIS — R339 Retention of urine, unspecified: Secondary | ICD-10-CM | POA: Diagnosis present

## 2021-11-23 DIAGNOSIS — D72829 Elevated white blood cell count, unspecified: Secondary | ICD-10-CM | POA: Diagnosis present

## 2021-11-23 DIAGNOSIS — M47812 Spondylosis without myelopathy or radiculopathy, cervical region: Secondary | ICD-10-CM | POA: Diagnosis not present

## 2021-11-23 DIAGNOSIS — Z885 Allergy status to narcotic agent status: Secondary | ICD-10-CM

## 2021-11-23 DIAGNOSIS — M5126 Other intervertebral disc displacement, lumbar region: Secondary | ICD-10-CM | POA: Diagnosis not present

## 2021-11-23 DIAGNOSIS — R778 Other specified abnormalities of plasma proteins: Secondary | ICD-10-CM | POA: Diagnosis present

## 2021-11-23 DIAGNOSIS — Z043 Encounter for examination and observation following other accident: Secondary | ICD-10-CM | POA: Diagnosis not present

## 2021-11-23 DIAGNOSIS — M6281 Muscle weakness (generalized): Secondary | ICD-10-CM | POA: Diagnosis not present

## 2021-11-23 DIAGNOSIS — N200 Calculus of kidney: Secondary | ICD-10-CM | POA: Diagnosis not present

## 2021-11-23 DIAGNOSIS — M47816 Spondylosis without myelopathy or radiculopathy, lumbar region: Secondary | ICD-10-CM | POA: Diagnosis not present

## 2021-11-23 DIAGNOSIS — S4991XA Unspecified injury of right shoulder and upper arm, initial encounter: Secondary | ICD-10-CM | POA: Diagnosis not present

## 2021-11-23 DIAGNOSIS — I10 Essential (primary) hypertension: Secondary | ICD-10-CM | POA: Diagnosis not present

## 2021-11-23 DIAGNOSIS — Z9181 History of falling: Secondary | ICD-10-CM | POA: Diagnosis not present

## 2021-11-23 DIAGNOSIS — E861 Hypovolemia: Secondary | ICD-10-CM | POA: Diagnosis present

## 2021-11-23 DIAGNOSIS — W1830XA Fall on same level, unspecified, initial encounter: Secondary | ICD-10-CM | POA: Diagnosis present

## 2021-11-23 DIAGNOSIS — Z741 Need for assistance with personal care: Secondary | ICD-10-CM | POA: Diagnosis not present

## 2021-11-23 DIAGNOSIS — M25551 Pain in right hip: Secondary | ICD-10-CM | POA: Diagnosis present

## 2021-11-23 DIAGNOSIS — R54 Age-related physical debility: Secondary | ICD-10-CM | POA: Diagnosis present

## 2021-11-23 DIAGNOSIS — N179 Acute kidney failure, unspecified: Principal | ICD-10-CM | POA: Diagnosis present

## 2021-11-23 DIAGNOSIS — R41841 Cognitive communication deficit: Secondary | ICD-10-CM | POA: Diagnosis not present

## 2021-11-23 DIAGNOSIS — Z853 Personal history of malignant neoplasm of breast: Secondary | ICD-10-CM

## 2021-11-23 DIAGNOSIS — Z681 Body mass index (BMI) 19 or less, adult: Secondary | ICD-10-CM

## 2021-11-23 DIAGNOSIS — Z79890 Hormone replacement therapy (postmenopausal): Secondary | ICD-10-CM

## 2021-11-23 DIAGNOSIS — M19011 Primary osteoarthritis, right shoulder: Secondary | ICD-10-CM | POA: Diagnosis not present

## 2021-11-23 DIAGNOSIS — R55 Syncope and collapse: Secondary | ICD-10-CM | POA: Diagnosis not present

## 2021-11-23 DIAGNOSIS — Z7982 Long term (current) use of aspirin: Secondary | ICD-10-CM

## 2021-11-23 DIAGNOSIS — H9190 Unspecified hearing loss, unspecified ear: Secondary | ICD-10-CM | POA: Diagnosis present

## 2021-11-23 DIAGNOSIS — R102 Pelvic and perineal pain: Secondary | ICD-10-CM | POA: Diagnosis not present

## 2021-11-23 DIAGNOSIS — Z602 Problems related to living alone: Secondary | ICD-10-CM | POA: Diagnosis present

## 2021-11-23 DIAGNOSIS — E039 Hypothyroidism, unspecified: Secondary | ICD-10-CM | POA: Diagnosis not present

## 2021-11-23 DIAGNOSIS — M40204 Unspecified kyphosis, thoracic region: Secondary | ICD-10-CM | POA: Diagnosis not present

## 2021-11-23 DIAGNOSIS — C50919 Malignant neoplasm of unspecified site of unspecified female breast: Secondary | ICD-10-CM | POA: Diagnosis not present

## 2021-11-23 DIAGNOSIS — R296 Repeated falls: Secondary | ICD-10-CM | POA: Diagnosis not present

## 2021-11-23 DIAGNOSIS — M2578 Osteophyte, vertebrae: Secondary | ICD-10-CM | POA: Diagnosis not present

## 2021-11-23 DIAGNOSIS — R1311 Dysphagia, oral phase: Secondary | ICD-10-CM | POA: Diagnosis not present

## 2021-11-23 DIAGNOSIS — M4312 Spondylolisthesis, cervical region: Secondary | ICD-10-CM | POA: Diagnosis not present

## 2021-11-23 DIAGNOSIS — R278 Other lack of coordination: Secondary | ICD-10-CM | POA: Diagnosis not present

## 2021-11-23 DIAGNOSIS — R001 Bradycardia, unspecified: Secondary | ICD-10-CM | POA: Diagnosis not present

## 2021-11-23 DIAGNOSIS — Z803 Family history of malignant neoplasm of breast: Secondary | ICD-10-CM

## 2021-11-23 LAB — CK: Total CK: 456 U/L — ABNORMAL HIGH (ref 38–234)

## 2021-11-23 LAB — CBC
HCT: 35.8 % — ABNORMAL LOW (ref 36.0–46.0)
Hemoglobin: 12 g/dL (ref 12.0–15.0)
MCH: 33.9 pg (ref 26.0–34.0)
MCHC: 33.5 g/dL (ref 30.0–36.0)
MCV: 101.1 fL — ABNORMAL HIGH (ref 80.0–100.0)
Platelets: 164 10*3/uL (ref 150–400)
RBC: 3.54 MIL/uL — ABNORMAL LOW (ref 3.87–5.11)
RDW: 19.2 % — ABNORMAL HIGH (ref 11.5–15.5)
WBC: 17.1 10*3/uL — ABNORMAL HIGH (ref 4.0–10.5)
nRBC: 1.2 % — ABNORMAL HIGH (ref 0.0–0.2)

## 2021-11-23 LAB — COMPREHENSIVE METABOLIC PANEL
ALT: 34 U/L (ref 0–44)
AST: 67 U/L — ABNORMAL HIGH (ref 15–41)
Albumin: 3.7 g/dL (ref 3.5–5.0)
Alkaline Phosphatase: 57 U/L (ref 38–126)
Anion gap: 16 — ABNORMAL HIGH (ref 5–15)
BUN: 91 mg/dL — ABNORMAL HIGH (ref 8–23)
CO2: 22 mmol/L (ref 22–32)
Calcium: 9 mg/dL (ref 8.9–10.3)
Chloride: 105 mmol/L (ref 98–111)
Creatinine, Ser: 3.66 mg/dL — ABNORMAL HIGH (ref 0.44–1.00)
GFR, Estimated: 11 mL/min — ABNORMAL LOW (ref >60)
Glucose, Bld: 123 mg/dL — ABNORMAL HIGH (ref 70–99)
Potassium: 4.4 mmol/L (ref 3.5–5.1)
Sodium: 143 mmol/L (ref 135–145)
Total Bilirubin: 2 mg/dL — ABNORMAL HIGH (ref 0.3–1.2)
Total Protein: 6.6 g/dL (ref 6.5–8.1)

## 2021-11-23 LAB — MAGNESIUM: Magnesium: 2.6 mg/dL — ABNORMAL HIGH (ref 1.7–2.4)

## 2021-11-23 LAB — CBG MONITORING, ED: Glucose-Capillary: 111 mg/dL — ABNORMAL HIGH (ref 70–99)

## 2021-11-23 LAB — RETICULOCYTES
Immature Retic Fract: 14.2 % (ref 2.3–15.9)
RBC.: 3.47 MIL/uL — ABNORMAL LOW (ref 3.87–5.11)
Retic Count, Absolute: 28.1 10*3/uL (ref 19.0–186.0)
Retic Ct Pct: 0.8 % (ref 0.4–3.1)

## 2021-11-23 LAB — PHOSPHORUS: Phosphorus: 5.4 mg/dL — ABNORMAL HIGH (ref 2.5–4.6)

## 2021-11-23 LAB — TROPONIN I (HIGH SENSITIVITY): Troponin I (High Sensitivity): 73 ng/L — ABNORMAL HIGH (ref <18)

## 2021-11-23 MED ORDER — SODIUM CHLORIDE 0.9% FLUSH
3.0000 mL | Freq: Once | INTRAVENOUS | Status: AC
  Administered 2021-11-23: 3 mL via INTRAVENOUS

## 2021-11-23 MED ORDER — LACTATED RINGERS IV BOLUS
1000.0000 mL | Freq: Once | INTRAVENOUS | Status: AC
  Administered 2021-11-23: 1000 mL via INTRAVENOUS

## 2021-11-23 NOTE — Assessment & Plan Note (Signed)
Hold statin given elevated CK repeat CK in AM.

## 2021-11-23 NOTE — Assessment & Plan Note (Signed)
Restart Norvasc at 5 mg p.o. daily when able to tolerate p.o.

## 2021-11-23 NOTE — ED Provider Notes (Signed)
[facility] DEPT Provider Note   CSN: [id number] Arrival date & time: 11/23/21  1942     History  Chief Complaint  Patient presents with   Tabitha Harris is a 88 y.o. female.  88 year old female with a history hypertension, hyperlipidemia, hypothyroid on levothyroxine who presents to the emergency department after a fall.  Patient reportedly lives by herself.  Neighbors noticed that she had not picked up her mail in 3 days so they called 911.  When EMS arrived she was on the ground.  Does not recall what happened.  They said that they saw empty bottles of her amlodipine and levothyroxine and cabinets that were completely empty without food in the unit.  She reports to me that she is having right hip pain but denies pain elsewhere.  Does not recall the events leading to her fall.  Denies any recent illnesses.   Fall   Past Medical History:  Diagnosis Date   Breast cancer ([place])    GERD (gastroesophageal reflux disease)    Hernia    History of breast cancer    left   Hyperlipidemia    Hypertension    Stroke [facility])    Thyroid disease    hypothyroidism       Home Medications Prior to Admission medications   Medication Sig Start Date End Date Taking? Authorizing Provider  acetaminophen (TYLENOL) 500 MG tablet Take 2 tablets (1,000 mg total) by mouth every 8 (eight) hours as needed for fever or headache (pain). 09/25/15   Barton Dubois, MD  amLODipine (NORVASC) 5 MG tablet Take 5 mg by mouth daily.    [provider]  aspirin 81 MG tablet Take 81 mg by mouth daily.    [provider]  atorvastatin (LIPITOR) 20 MG tablet Take 20 mg by mouth daily.    [provider]  bimatoprost (LUMIGAN) 0.01 % SOLN 1 drop at bedtime.    [provider]  calcium carbonate (OS-CAL) 600 MG TABS Take 600 mg by mouth 2 (two) times daily with a meal.    [provider]  dorzolamide-timolol (COSOPT) 22.3-6.8 MG/ML  ophthalmic solution  12/13/11   [provider]  esomeprazole (NEXIUM) 40 MG capsule Take 40 mg by mouth daily as needed.    [provider]  fish oil-omega-3 fatty acids 1000 MG capsule Take 2 g by mouth daily.    [provider]  levothyroxine (SYNTHROID, LEVOTHROID) 75 MCG tablet Take 75 mcg by mouth daily.    [provider]  Multiple Vitamins-Minerals (PRESERVISION AREDS 2+MULTI VIT PO) Take by mouth.    [provider]  phenol (CHLORASEPTIC) 1.4 % LIQD Use as directed 1 spray in the mouth or throat as needed for throat irritation / pain. 09/25/15   Barton Dubois, MD      Allergies    Codeine    Review of Systems   Review of Systems  Physical Exam Updated Vital Signs BP (!) 148/75 (BP Location: Left Arm)   Pulse 86   Temp 98.4 F (36.9 C) (Oral)   Resp 18   Ht '4\' 11"'$  (1.499 m)   Wt 37.7 kg   SpO2 100%   BMI 16.79 kg/m  Physical Exam Vitals and nursing note reviewed.  Constitutional:      Appearance: She is well-developed.     Comments: AAOx3.  Chronically ill-appearing.  Cachectic.  HENT:     Head: Normocephalic and atraumatic.  Right Ear: External ear normal.     Left Ear: External ear normal.     Nose: Nose normal.     Mouth/Throat:     Mouth: Mucous membranes are dry.     Pharynx: Oropharynx is clear.  Eyes:     Extraocular Movements: Extraocular movements intact.     Conjunctiva/sclera: Conjunctivae normal.     Pupils: Pupils are equal, round, and reactive to light.     Comments: Pupils 5 mm bilaterally  Cardiovascular:     Rate and Rhythm: Normal rate and regular rhythm.     Heart sounds: No murmur heard. Pulmonary:     Effort: Pulmonary effort is normal. No respiratory distress.     Breath sounds: Normal breath sounds.  Abdominal:     General: Abdomen is flat. Bowel sounds are normal. There is no distension.     Palpations: Abdomen is soft. There is no mass.     Tenderness: There is no abdominal tenderness.  There is no guarding.  Musculoskeletal:        General: No swelling.     Cervical back: Neck supple.     Right lower leg: No edema.     Left lower leg: No edema.     Comments: Tenderness to palpation of right hip  Skin:    General: Skin is warm and dry.     Capillary Refill: Capillary refill takes more than 3 seconds.  Neurological:     General: No focal deficit present.     Mental Status: She is alert and oriented to person, place, and time. Mental status is at baseline.     Comments: No gross cranial nerve deficits.  Moving all 4 extremities equally with 5/5 strength on strength testing.  Intact sensation to light touch in all extremities.  Psychiatric:        Mood and Affect: Mood normal.   Sacral wound   L upper arm wound   ED Results / Procedures / Treatments   Labs (all labs ordered are listed, but only abnormal results are displayed) Labs Reviewed  CBC - Abnormal; Notable for the following components:      Result Value   WBC 17.1 (*)    RBC 3.54 (*)    HCT 35.8 (*)    MCV 101.1 (*)    RDW 19.2 (*)    nRBC 1.2 (*)    All other components within normal limits  CK - Abnormal; Notable for the following components:   Total CK 456 (*)    All other components within normal limits  MAGNESIUM - Abnormal; Notable for the following components:   Magnesium 2.6 (*)    All other components within normal limits  PHOSPHORUS - Abnormal; Notable for the following components:   Phosphorus 5.4 (*)    All other components within normal limits  COMPREHENSIVE METABOLIC PANEL - Abnormal; Notable for the following components:   Glucose, Bld 123 (*)    BUN 91 (*)    Creatinine, Ser 3.66 (*)    AST 67 (*)    Total Bilirubin 2.0 (*)    GFR, Estimated 11 (*)    Anion gap 16 (*)    All other components within normal limits  RETICULOCYTES - Abnormal; Notable for the following components:   RBC. 3.47 (*)    All other components within normal limits  CK - Abnormal; Notable for the  following components:   Total CK 424 (*)    All other components within normal  limits  CBG MONITORING, ED - Abnormal; Notable for the following components:   Glucose-Capillary 111 (*)    All other components within normal limits  TROPONIN I (HIGH SENSITIVITY) - Abnormal; Notable for the following components:   Troponin I (High Sensitivity) 73 (*)    All other components within normal limits  CREATININE, URINE, RANDOM  OSMOLALITY, URINE  PREALBUMIN  SODIUM, URINE, RANDOM  URINALYSIS, COMPLETE (UACMP) WITH MICROSCOPIC  VITAMIN B12  FOLATE  IRON AND TIBC  FERRITIN  TSH  OSMOLALITY  COMPREHENSIVE METABOLIC PANEL  CBC  MAGNESIUM  PHOSPHORUS  TROPONIN I (HIGH SENSITIVITY)    EKG EKG Interpretation  Date/Time:  Thursday November 23 2021 20:44:52 EDT Ventricular Rate:  86 PR Interval:  190 QRS Duration: 90 QT Interval:  349 QTC Calculation: 418 R Axis:   85 Text Interpretation: Sinus rhythm Borderline right axis deviation Anteroseptal infarct, old Borderline repolarization abnormality When compared to ekg from 09/20/2015 ST depressions in the anterolateral and inferior leads now presents Confirmed by Margaretmary Eddy ([id number]) on 11/24/2021 1:24:44 AM  Radiology CT Cervical Spine Wo Contrast  Result Date: 11/23/2021 CLINICAL DATA:  Witnessed fall. EXAM: CT CERVICAL SPINE WITHOUT CONTRAST TECHNIQUE: Multidetector CT imaging of the cervical spine was performed without intravenous contrast. Multiplanar CT image reconstructions were also generated. RADIATION DOSE REDUCTION: This exam was performed according to the departmental dose-optimization program which includes automated exposure control, adjustment of the mA and/or kV according to patient size and/or use of iterative reconstruction technique. COMPARISON:  None Available. FINDINGS: Alignment: There is approximately 4 mm retrolisthesis of the C5 vertebral body on C6. Skull base and vertebrae: No acute fracture. Chronic and degenerative  changes are seen involving the body and tip of the dens. Soft tissues and spinal canal: No prevertebral fluid or swelling. No visible canal hematoma. Disc levels: Marked severity endplate sclerosis is seen at the levels of C5-C6 and C6-C7, with moderate severity endplate sclerosis seen at the level of C4-C5. There is marked severity narrowing of the anterior atlantoaxial articulation. Marked severity intervertebral disc space narrowing is seen at C5-C6 and C6-C7. Posterior intervertebral disc space narrowing is noted at C4-C5. Bilateral marked severity multilevel facet joint hypertrophy is noted. Upper chest: Mild to moderate severity biapical scarring and/or atelectasis is seen. Other: None. IMPRESSION: 1. No acute fracture of the cervical spine. 2. Marked severity multilevel degenerative changes, as described above. 3. 4 mm retrolisthesis of the C5 vertebral body on C6. 4. Mild to moderate severity biapical scarring and/or atelectasis. Electronically Signed   By: Virgina Norfolk M.D.   On: 11/23/2021 21:43   CT HEAD WO CONTRAST  Result Date: 11/23/2021 CLINICAL DATA:  Unwitnessed fall. EXAM: CT HEAD WITHOUT CONTRAST TECHNIQUE: Contiguous axial images were obtained from the base of the skull through the vertex without intravenous contrast. RADIATION DOSE REDUCTION: This exam was performed according to the departmental dose-optimization program which includes automated exposure control, adjustment of the mA and/or kV according to patient size and/or use of iterative reconstruction technique. COMPARISON:  Sep 20, 2015 FINDINGS: Brain: There is mild cerebral atrophy with widening of the extra-axial spaces and ventricular dilatation. There are areas of decreased attenuation within the white matter tracts of the supratentorial brain, consistent with microvascular disease changes. Vascular: No hyperdense vessel or unexpected calcification. Skull: Normal. Negative for fracture or focal lesion. Sinuses/Orbits: No  acute finding. Other: None. IMPRESSION: 1. No acute intracranial abnormality. 2. Paralyzed cerebral atrophy with chronic white matter small vessel ischemic changes. Electronically Signed  By: Virgina Norfolk M.D.   On: 11/23/2021 21:36   DG Pelvis 1-2 Views  Result Date: 11/23/2021 CLINICAL DATA:  Recent fall with right hip pain, initial encounter EXAM: PELVIS - 1 VIEW COMPARISON:  None Available. FINDINGS: Pelvic ring is intact. No acute fracture or dislocation is seen. No soft tissue abnormality is noted. Degenerative changes of the lumbar spine are noted. IMPRESSION: No acute abnormality noted. Electronically Signed   By: Inez Catalina M.D.   On: 11/23/2021 21:00   DG Chest 1 View  Result Date: 11/23/2021 CLINICAL DATA:  Unwitnessed fall. EXAM: CHEST  1 VIEW COMPARISON:  Chest x-ray 05/09/2010 FINDINGS: The heart size and mediastinal contours are within normal limits. Calcified pleural plaques in the lung apices appear unchanged. The lungs are otherwise clear. Surgical clips overlie the left lower lung. The visualized skeletal structures are unremarkable. IMPRESSION: 1. No acute cardiopulmonary process. 2. Calcified pleural plaques. Electronically Signed   By: Ronney Asters M.D.   On: 11/23/2021 21:00    Procedures Procedures    Medications Ordered in ED Medications  latanoprost (XALATAN) 0.005 % ophthalmic solution 1 drop (has no administration in time range)  dorzolamide-timolol (COSOPT) 22.3-6.8 MG/ML ophthalmic solution 1 drop (has no administration in time range)  pantoprazole (PROTONIX) EC tablet 40 mg (has no administration in time range)  levothyroxine (SYNTHROID) tablet 75 mcg (has no administration in time range)  amLODipine (NORVASC) tablet 5 mg (has no administration in time range)  0.9 %  sodium chloride infusion ( Intravenous New Bag/Given 11/24/21 0102)  acetaminophen (TYLENOL) tablet 650 mg (has no administration in time range)    Or  acetaminophen (TYLENOL) suppository 650  mg (has no administration in time range)  HYDROcodone-acetaminophen (NORCO/VICODIN) 5-325 MG per tablet 1-2 tablet (has no administration in time range)  thiamine (VITAMIN B1) tablet 100 mg (100 mg Oral Given 11/24/21 0102)  multivitamin with minerals tablet 1 tablet (has no administration in time range)  folic acid (FOLVITE) tablet 1 mg (has no administration in time range)  feeding supplement (ENSURE ENLIVE / ENSURE PLUS) liquid 237 mL (has no administration in time range)  sodium chloride flush (NS) 0.9 % injection 3 mL (3 mLs Intravenous Given 11/23/21 2047)  lactated ringers bolus 1,000 mL (1,000 mLs Intravenous Bolus 11/23/21 2046)    ED Course/ Medical Decision Making/ A&P Clinical Course as of 11/24/21 0127  Thu Nov 23, 2021  2243 Discussed with Dr Roel Cluck who will admit the patient.  [RP]    Clinical Course User Index [RP] Fransico Meadow, MD                           Medical Decision Making 88 year old female who presents to the emergency department after a fall at home.  Patient does not recall the events of the fall but on exam she appears cachectic and per EMS appear that she is unable to take care of herself at this time.  Labs were sent including a CK given her possible prolonged downtime which showed a severe acute kidney injury.  She was given fluids.  X-rays were obtained to ensure that she did not have a hip fracture which was negative.  Also obtain a chest x-ray to evaluate for pneumonia which did not show one.  CT of the head and C-spine did not show acute injury.  Awaiting urinalysis at this time to ensure that patient did not have a urinary tract infection that made her  confused and fall.  Patient did have EKG which did show new ST depressions in the anterior lateral and inferior leads which appear to be new.  Troponin was sent and was found to be elevated at 72.  However, patient not currently expressing anginal equivalents or other symptoms of ACS.  Patient was then admitted  to medicine for further management.  Amount and/or Complexity of Data Reviewed Labs: ordered. Radiology: ordered.  Risk Decision regarding hospitalization.   Final Clinical Impression(s) / ED Diagnoses Final diagnoses:  Fall, initial encounter  AKI (acute kidney injury) [facility])    Rx / [place] Orders ED Discharge Orders     None         Fransico Meadow, MD 11/24/21 0127

## 2021-11-23 NOTE — Assessment & Plan Note (Signed)
-   currently appears to be slightly on the dry side, hold home diuretics for tonight and restart when appears euvolemic, carefuly follow fluid status and Cr  

## 2021-11-23 NOTE — Assessment & Plan Note (Signed)
Seems to be at this time in remission.

## 2021-11-23 NOTE — Subjective & Objective (Signed)
Patient was brought from home with unwitnessed fall unknown downtime. Neighbor noted that patient's newspaper has not been picked up for few days and called 911.  When EMS arrived she was found to be on her right side on the carpet blood sugar was 142 she had a large left upper arm and right lower extremity skin tear as well as injury to her head  Patient known history of anemia for which she followed by hematology oncology Dr. Park Liter

## 2021-11-23 NOTE — Assessment & Plan Note (Signed)
Myocardia's 20 mg p.o. twice daily obtain CT pelvis to further evaluate for any presence of fractures.  Also will obtain CT lumbar and thoracic spine

## 2021-11-23 NOTE — Assessment & Plan Note (Signed)
Resume aspirin when able to tolerate hold Lipitor for now given elevated CK

## 2021-11-23 NOTE — Assessment & Plan Note (Signed)
PT OT evaluation patient likely will need placement discussed overall goals of care patient states she does not wish to have aggressive interventions done at this time.  Order palliative care consult patient cannot recollect exactly how the fall has occurred so cannot rule out syncope.  Obtain echogram.  If patient is agreeable could pursue MRI brain to evaluate for any potential CVA.  But at this time patient leaning towards avoiding aggressive interventions and studies

## 2021-11-23 NOTE — Assessment & Plan Note (Signed)
In the setting of recent fall.  Continue to follow troponin obtain echogram noted elevated CK

## 2021-11-23 NOTE — Assessment & Plan Note (Signed)
Hemoglobin up currently likely secondary to hemoconcentration expect hemoglobin drop with rehydration.  Given elevated MCV we will check B12 and folate levels.  Administer thiamine given poor p.o. intake

## 2021-11-23 NOTE — H&P (Signed)
Tabitha Harris HUD:[id number] DOB: 08/26/1919 DOA: 11/23/2021    PCP: Merrilee Seashore, MD   Outpatient Specialists:      Oncology  Dr.Iruku    Patient arrived to ER on 11/23/21 at 1942 Referred by Attending Fransico Meadow, MD   Patient coming from:    home Lives alone,        Chief Complaint:   Chief Complaint  Patient presents with   Fall    HPI: Tabitha Harris is a 88 y.o. female with medical history significant of TIA, HTN, HLD Hx of   breast cancer, anemia, HOH, hypothyroidism, GERD,   Presented with fall was found down  Patient was brought from home with unwitnessed fall unknown downtime. Neighbor noted that patient's newspaper has not been picked up for few days and called 911.  When EMS arrived she was found to be on her right side on the carpet blood sugar was 142 she had a large left upper arm and right lower extremity skin tear as well as injury to her head  Patient known history of anemia for which she followed by hematology oncology Dr. Park Liter Patient endorses right hip pain she does not know how she ended up on down denies any chest pain or recent illness Patient states she lives alone she has not been eating or taking her medications she does not have any family in the area.   Regarding pertinent Chronic problems:     Hyperlipidemia -  on statins Lipitor (atorvastatin)  Lipid Panel     Component Value Date/Time   CHOL 136 09/21/2015 0519   TRIG 59 09/21/2015 0519   HDL 60 09/21/2015 0519   CHOLHDL 2.3 09/21/2015 0519   VLDL 12 09/21/2015 0519   LDLCALC 64 09/21/2015 0519     HTN on NOrvasc   chronic CHF diastolic - last echo 8588 EF 50% grade 2 diastolic CHF     Hypothyroidism:  on synthroid     Hx of CVA -  with/out residual deficits on Aspirin 81 mg,      Chronic anemia - baseline hg Hemoglobin & Hematocrit 9    While in ER: Clinical Course as of 11/23/21 2304  Thu Nov 23, 2021  2243 Discussed with Dr Roel Cluck who will admit the  patient.  [RP]    Clinical Course User Index [RP] Fransico Meadow, MD     Ordered  CT HEAD/spine   NON acute  CXR -  NON acute Films of pelvis negative    Following Medications were ordered in ER: Medications  sodium chloride flush (NS) 0.9 % injection 3 mL (3 mLs Intravenous Given 11/23/21 2047)  lactated ringers bolus 1,000 mL (1,000 mLs Intravenous Bolus 11/23/21 2046)       ED Triage Vitals  Enc Vitals Group     BP 11/23/21 2044 126/72     Pulse Rate 11/23/21 2035 88     Resp 11/23/21 2035 14     Temp 11/23/21 2044 (!) 97.4 F (36.3 C)     Temp Source 11/23/21 2044 Oral     SpO2 11/23/21 2035 98 %     Weight --      Height --      Head Circumference --      Peak Flow --      Pain Score --      Pain Loc --      Pain Edu? --      Excl. in [place]? --  FAOZ(30)@     _________________________________________ Significant initial  Findings: Abnormal Labs Reviewed  CBC - Abnormal; Notable for the following components:      Result Value   WBC 17.1 (*)    RBC 3.54 (*)    HCT 35.8 (*)    MCV 101.1 (*)    RDW 19.2 (*)    nRBC 1.2 (*)    All other components within normal limits  CK - Abnormal; Notable for the following components:   Total CK 456 (*)    All other components within normal limits  MAGNESIUM - Abnormal; Notable for the following components:   Magnesium 2.6 (*)    All other components within normal limits  PHOSPHORUS - Abnormal; Notable for the following components:   Phosphorus 5.4 (*)    All other components within normal limits  COMPREHENSIVE METABOLIC PANEL - Abnormal; Notable for the following components:   Glucose, Bld 123 (*)    BUN 91 (*)    Creatinine, Ser 3.66 (*)    AST 67 (*)    Total Bilirubin 2.0 (*)    GFR, Estimated 11 (*)    Anion gap 16 (*)    All other components within normal limits  CBG MONITORING, ED - Abnormal; Notable for the following components:   Glucose-Capillary 111 (*)    All other components within normal limits      _________________________ Troponin 74  ECG: Ordered Personally reviewed and interpreted by me showing: HR : 86 Rhythm: Sinus rhythm Borderline right axis deviation Anteroseptal infarct, old Borderline repolarization abnormality QTC 418     The recent clinical data is shown below. Vitals:   11/23/21 2040 11/23/21 2044 11/23/21 2100 11/23/21 2227  BP:  126/72 (!) 154/89 (!) 144/83  Pulse: 88  80 83  Resp: '19  17 14  '$ Temp:  (!) 97.4 F (36.3 C)    TempSrc:  Oral    SpO2: 99%  99% 100%      WBC     Component Value Date/Time   WBC 17.1 (H) 11/23/2021 2042   LYMPHSABS 2.4 08/31/2020 1415   LYMPHSABS 2.5 02/28/2012 1203   MONOABS 0.8 08/31/2020 1415   MONOABS 0.7 02/28/2012 1203   EOSABS 0.1 08/31/2020 1415   EOSABS 0.2 02/28/2012 1203   BASOSABS 0.1 08/31/2020 1415   BASOSABS 0.1 02/28/2012 1203      UA   ordered    _______________________________________________ Hospitalist was called for admission for AKI secondary to dehydration and a fall   The following Work up has been ordered so far:  Orders Placed This Encounter  Procedures   CT HEAD WO CONTRAST   CT Cervical Spine Wo Contrast   DG Chest 1 View   DG Pelvis 1-2 Views   CBC   Urinalysis, Routine w reflex microscopic   CK   Magnesium   Phosphorus   Comprehensive metabolic panel   Document Height and Actual Weight   Cardiac monitoring (when placed in a treatment room)   Saline Lock IV, Maintain IV access (when placed in a treatment room)   Consult to hospitalist   Pulse oximetry, continuous (when placed in a treatment room)   CBG monitoring, ED   ED EKG   EKG 12-Lead     OTHER Significant initial  Findings:  labs showing:    Recent Labs  Lab 11/23/21 2042  NA 143  K 4.4  CO2 22  GLUCOSE 123*  BUN 91*  CREATININE 3.66*  CALCIUM 9.0  MG 2.6*  PHOS 5.4*    Cr  * stable,  Up from baseline see below Lab Results  Component Value Date   CREATININE 3.66 (H) 11/23/2021   CREATININE  0.74 08/31/2020   CREATININE 0.67 09/24/2015    Recent Labs  Lab 11/23/21 2042  AST 67*  ALT 34  ALKPHOS 57  BILITOT 2.0*  PROT 6.6  ALBUMIN 3.7   Lab Results  Component Value Date   CALCIUM 9.0 11/23/2021   PHOS 5.4 (H) 11/23/2021    Plt: Lab Results  Component Value Date   PLT 164 11/23/2021        Recent Labs  Lab 11/23/21 2042  WBC 17.1*  HGB 12.0  HCT 35.8*  MCV 101.1*  PLT 164    HG/HCT  stable,       Component Value Date/Time   HGB 12.0 11/23/2021 2042   HGB 11.3 (L) 02/28/2012 1203   HCT 35.8 (L) 11/23/2021 2042   HCT 32.8 (L) 02/28/2012 1203   MCV 101.1 (H) 11/23/2021 2042   MCV 105.7 (H) 02/28/2012 1203     Cardiac Panel (last 3 results) Recent Labs    11/23/21 2042  CKTOTAL 456*    CBG (last 3)  Recent Labs    11/23/21 2039  GLUCAP 111*      Cultures: No results found for: "SDES", "SPECREQUEST", "CULT", "REPTSTATUS"   Radiological Exams on Admission: CT Cervical Spine Wo Contrast  Result Date: 11/23/2021 CLINICAL DATA:  Witnessed fall. EXAM: CT CERVICAL SPINE WITHOUT CONTRAST TECHNIQUE: Multidetector CT imaging of the cervical spine was performed without intravenous contrast. Multiplanar CT image reconstructions were also generated. RADIATION DOSE REDUCTION: This exam was performed according to the departmental dose-optimization program which includes automated exposure control, adjustment of the mA and/or kV according to patient size and/or use of iterative reconstruction technique. COMPARISON:  None Available. FINDINGS: Alignment: There is approximately 4 mm retrolisthesis of the C5 vertebral body on C6. Skull base and vertebrae: No acute fracture. Chronic and degenerative changes are seen involving the body and tip of the dens. Soft tissues and spinal canal: No prevertebral fluid or swelling. No visible canal hematoma. Disc levels: Marked severity endplate sclerosis is seen at the levels of C5-C6 and C6-C7, with moderate severity endplate  sclerosis seen at the level of C4-C5. There is marked severity narrowing of the anterior atlantoaxial articulation. Marked severity intervertebral disc space narrowing is seen at C5-C6 and C6-C7. Posterior intervertebral disc space narrowing is noted at C4-C5. Bilateral marked severity multilevel facet joint hypertrophy is noted. Upper chest: Mild to moderate severity biapical scarring and/or atelectasis is seen. Other: None. IMPRESSION: 1. No acute fracture of the cervical spine. 2. Marked severity multilevel degenerative changes, as described above. 3. 4 mm retrolisthesis of the C5 vertebral body on C6. 4. Mild to moderate severity biapical scarring and/or atelectasis. Electronically Signed   By: Virgina Norfolk M.D.   On: 11/23/2021 21:43   CT HEAD WO CONTRAST  Result Date: 11/23/2021 CLINICAL DATA:  Unwitnessed fall. EXAM: CT HEAD WITHOUT CONTRAST TECHNIQUE: Contiguous axial images were obtained from the base of the skull through the vertex without intravenous contrast. RADIATION DOSE REDUCTION: This exam was performed according to the departmental dose-optimization program which includes automated exposure control, adjustment of the mA and/or kV according to patient size and/or use of iterative reconstruction technique. COMPARISON:  Sep 20, 2015 FINDINGS: Brain: There is mild cerebral atrophy with widening of the extra-axial spaces and ventricular dilatation. There are areas of decreased attenuation within  the white matter tracts of the supratentorial brain, consistent with microvascular disease changes. Vascular: No hyperdense vessel or unexpected calcification. Skull: Normal. Negative for fracture or focal lesion. Sinuses/Orbits: No acute finding. Other: None. IMPRESSION: 1. No acute intracranial abnormality. 2. Paralyzed cerebral atrophy with chronic white matter small vessel ischemic changes. Electronically Signed   By: Virgina Norfolk M.D.   On: 11/23/2021 21:36   DG Pelvis 1-2 Views  Result  Date: 11/23/2021 CLINICAL DATA:  Recent fall with right hip pain, initial encounter EXAM: PELVIS - 1 VIEW COMPARISON:  None Available. FINDINGS: Pelvic ring is intact. No acute fracture or dislocation is seen. No soft tissue abnormality is noted. Degenerative changes of the lumbar spine are noted. IMPRESSION: No acute abnormality noted. Electronically Signed   By: Inez Catalina M.D.   On: 11/23/2021 21:00   DG Chest 1 View  Result Date: 11/23/2021 CLINICAL DATA:  Unwitnessed fall. EXAM: CHEST  1 VIEW COMPARISON:  Chest x-ray 05/09/2010 FINDINGS: The heart size and mediastinal contours are within normal limits. Calcified pleural plaques in the lung apices appear unchanged. The lungs are otherwise clear. Surgical clips overlie the left lower lung. The visualized skeletal structures are unremarkable. IMPRESSION: 1. No acute cardiopulmonary process. 2. Calcified pleural plaques. Electronically Signed   By: Ronney Asters M.D.   On: 11/23/2021 21:00   _______________________________________________________________________________________________________ Latest   Blood pressure (!) 144/83, pulse 83, temperature (!) 97.4 F (36.3 C), temperature source Oral, resp. rate 14, SpO2 100 %.   Vitals  labs and radiology finding personally reviewed  Review of Systems:    Pertinent positives include:  fatigue,   Constitutional:  No weight loss, night sweats, Fevers, chills, weight loss  HEENT:  No headaches, Difficulty swallowing,Tooth/dental problems,Sore throat,  No sneezing, itching, ear ache, nasal congestion, post nasal drip,  Cardio-vascular:  No chest pain, Orthopnea, PND, anasarca, dizziness, palpitations.no Bilateral lower extremity swelling  GI:  No heartburn, indigestion, abdominal pain, nausea, vomiting, diarrhea, change in bowel habits, loss of appetite, melena, blood in stool, hematemesis Resp:  no shortness of breath at rest. No dyspnea on exertion, No excess mucus, no productive cough, No  non-productive cough, No coughing up of blood.No change in color of mucus.No wheezing. Skin:  no rash or lesions. No jaundice GU:  no dysuria, change in color of urine, no urgency or frequency. No straining to urinate.  No flank pain.  Musculoskeletal:  No joint pain or no joint swelling. No decreased range of motion. No back pain.  Psych:  No change in mood or affect. No depression or anxiety. No memory loss.  Neuro: no localizing neurological complaints, no tingling, no weakness, no double vision, no gait abnormality, no slurred speech, no confusion  All systems reviewed and apart from [place] all are negative _______________________________________________________________________________________________ Past Medical History:   Past Medical History:  Diagnosis Date   Breast cancer ([place])    GERD (gastroesophageal reflux disease)    Hernia    History of breast cancer    left   Hyperlipidemia    Hypertension    Stroke [facility])    Thyroid disease    hypothyroidism      Past Surgical History:  Procedure Laterality Date   BREAST BIOPSY     BREAST LUMPECTOMY  2012   left   ENDARTERECTOMY Right 09/23/2015   Procedure: ENDARTERECTOMY CAROTID WITH DACRON PATCH ANGIOPLASTY;  Surgeon: Mal Misty, MD;  Location: [place];  Service: Vascular;  Laterality: Right;    Social History:  Ambulatory  walker      reports that she has never smoked. She has never used smokeless tobacco. She reports current alcohol use of about 1.0 standard drink of alcohol per week. She reports that she does not use drugs.     Family History:   Family History  Problem Relation Age of Onset   Cancer Mother        breast   Breast cancer Mother    ______________________________________________________________________________________________ Allergies: Allergies  Allergen Reactions   Codeine      Prior to Admission medications   Medication Sig Start Date End Date Taking? Authorizing Provider   acetaminophen (TYLENOL) 500 MG tablet Take 2 tablets (1,000 mg total) by mouth every 8 (eight) hours as needed for fever or headache (pain). 09/25/15   Barton Dubois, MD  amLODipine (NORVASC) 5 MG tablet Take 5 mg by mouth daily.    [provider]  aspirin 81 MG tablet Take 81 mg by mouth daily.    [provider]  atorvastatin (LIPITOR) 20 MG tablet Take 20 mg by mouth daily.    [provider]  bimatoprost (LUMIGAN) 0.01 % SOLN 1 drop at bedtime.    [provider]  calcium carbonate (OS-CAL) 600 MG TABS Take 600 mg by mouth 2 (two) times daily with a meal.    [provider]  dorzolamide-timolol (COSOPT) 22.3-6.8 MG/ML ophthalmic solution  12/13/11   [provider]  esomeprazole (NEXIUM) 40 MG capsule Take 40 mg by mouth daily as needed.    [provider]  fish oil-omega-3 fatty acids 1000 MG capsule Take 2 g by mouth daily.    [provider]  levothyroxine (SYNTHROID, LEVOTHROID) 75 MCG tablet Take 75 mcg by mouth daily.    [provider]  Multiple Vitamins-Minerals (PRESERVISION AREDS 2+MULTI VIT PO) Take by mouth.    [provider]  phenol (CHLORASEPTIC) 1.4 % LIQD Use as directed 1 spray in the mouth or throat as needed for throat irritation / pain. 09/25/15   Barton Dubois, MD    ___________________________________________________________________________________________________ Physical Exam:    11/23/2021   10:27 PM 11/23/2021    9:00 PM 11/23/2021    8:44 PM  Vitals with BMI  Systolic [phone]  Diastolic 83 89 72  Pulse 83 80     1. General:  in No  Acute distress    Chronically ill cachectic  -appearing 2. Psychological: Alert and   Oriented 3. Head/ENT:   Dry Mucous Membranes                          Head Non traumatic, neck supple                           Poor Dentition 4. SKIN:  decreased Skin turgor,  Skin clean Dry skin tears noted legs multiple bruises noted 5. Heart:  Regular rate and rhythm no  Murmur, no Rub or gallop 6. Lungs:  , no wheezes or crackles   7. Abdomen: Soft,  non-tender, Non distended bowel sounds present 8. Lower extremities: no clubbing, cyanosis, no  edema 9. Neurologically Grossly intact, moving all 4 extremities equally   10. MSK: Normal range of motion    Chart has been reviewed  ______________________________________________________________________________________________  Assessment/Plan 88 y.o. female with medical history significant of TIA, HTN, HLD Hx of   breast cancer, anemia, HOH, hypothyroidism, GERD,  Admitted for  AKI secondary to dehydration and a fall   Present on Admission:  AKI (acute kidney injury) ([place])  Essential hypertension  HLD (hyperlipidemia)  Macrocytic anemia  Hip pain  Elevated troponin  Chronic diastolic CHF (congestive heart failure) ([facility])  Leukocytosis   AKI (acute kidney injury) ([place]) In the setting of severe dehydration.  Obtain urine electrolytes.  Rehydrate and follow. CT abdomen ordered to evaluate for any intra-abdominal process that could contribute If renal function does not improve would need further discussion with patient regarding possible dialysis at this point she seemed like she was leaning against aggressive intervention if Cr not improved  may need further discussion  Essential hypertension Restart Norvasc at 5 mg p.o. daily when able to tolerate p.o.  HLD (hyperlipidemia) Hold statin given elevated CK repeat CK in AM.  hx: breast cancer, left UOQ, invasive lobular, receptor + her 2 - Seems to be at this time in remission.  Macrocytic anemia Hemoglobin up currently likely secondary to hemoconcentration expect hemoglobin drop with rehydration.  Given elevated MCV we will check B12 and folate levels.  Administer thiamine given poor p.o. intake  History of TIA (transient ischemic attack) Resume aspirin when able to tolerate hold Lipitor for now given elevated CK  Fall at  home, initial encounter PT OT evaluation patient likely will need placement discussed overall goals of care patient states she does not wish to have aggressive interventions done at this time.  Order palliative care consult patient cannot recollect exactly how the fall has occurred so cannot rule out syncope.  Obtain echogram.  If patient is agreeable could pursue MRI brain to evaluate for any potential CVA.  But at this time patient leaning towards avoiding aggressive interventions and studies  Hip pain Myocardia's 20 mg p.o. twice daily obtain CT pelvis to further evaluate for any presence of fractures.  Also will obtain CT lumbar and thoracic spine  Elevated troponin In the setting of recent fall.  Continue to follow troponin obtain echogram noted elevated CK   Chronic diastolic CHF (congestive heart failure) ([place]) - currently appears to be slightly on the dry side, hold home diuretics for tonight and restart when appears euvolemic, carefuly follow fluid status and Cr   Leukocytosis At this point no evidence of infection likely secondary to hemoconcentration we will continue to monitor for any sign of infectious process suspect will improve with IV fluid rehydration   Other plan as per orders.  DVT prophylaxis:  SCD     Code Status:  DNR/DNI   as per patient   I had personally discussed CODE STATUS with patient    Family Communication:   Family not at  Bedside    Disposition Plan:     likely will need placement for rehabilitation                         Following barriers for discharge:                            Electrolytes corrected                              Will need to be able to tolerate PO  Would benefit from PT/OT eval prior to DC  Ordered                   Swallow eval - SLP ordered                                       Transition of care consulted                   Nutrition    consulted                   Wound care  consulted                   Palliative care    consulted                                     Consults called: none   Admission status:  ED Disposition     ED Disposition  Admit   Condition  --   [place]: [place] [[id number]]  Level of Care: Progressive [102]  Admit to Progressive based on following criteria: NEPHROLOGY stable condition requiring close monitoring for AKI, requiring Hemodialysis or Peritoneal Dialysis either from expected electrolyte imbalance, acidosis, or fluid overload that can be managed by NIPPV or high flow oxygen.  May admit patient to [facility] or [facility] if equivalent level of care is available:: No  Covid Evaluation: Asymptomatic - no recent exposure (last 10 days) testing not required  Diagnosis: AKI (acute kidney injury) [facility]) [[id number]]  Admitting Physician: Toy Baker [[id number]]  Attending Physician: Toy Baker [[id number]]  Certification:: I certify this patient will need inpatient services for at least 2 midnights  Estimated Length of Stay: 2            inpatient     I Expect 2 midnight stay secondary to severity of patient's current illness need for inpatient interventions justified by the following:     Severe lab/radiological/exam abnormalities including:   AKI and extensive comorbidities including:  CHF      malignancy,    That are currently affecting medical management.   I expect  patient to be hospitalized for 2 midnights requiring inpatient medical care.  Patient is at high risk for adverse outcome (such as loss of life or disability) if not treated.  Indication for inpatient stay as follows:      Need for I  IV fluids    Level of care    progressive tele indefinitely please discontinue once patient no longer qualifies COVID-19 Labs      Idelia Caudell 11/23/2021, 11:43 PM    [facility]     after 2 AM please page floor coverage  PA If 7AM-7PM, please contact the day team taking care of the patient using [email or web address]   Patient was evaluated in the context of the global COVID-19 pandemic, which necessitated consideration that the patient might be at risk for infection with the SARS-CoV-2 virus that causes COVID-19. Institutional protocols and algorithms that pertain to the evaluation of patients at risk for COVID-19 are in a state of rapid change based on information released by regulatory bodies including the CDC and federal and state organizations. These policies and algorithms were followed during the patient's care.

## 2021-11-23 NOTE — Assessment & Plan Note (Addendum)
In the setting of severe dehydration.  Obtain urine electrolytes.  Rehydrate and follow. CT abdomen ordered to evaluate for any intra-abdominal process that could contribute If renal function does not improve would need further discussion with patient regarding possible dialysis at this point she seemed like she was leaning against aggressive intervention if Cr not improved  may need further discussion

## 2021-11-23 NOTE — ED Triage Notes (Signed)
Pt arrives [facility] from home for an unwitnessed fall and unknown down time. Pt's neighbor noticed the patient's newspapers haven't been picked up for the past 3 days which prompted her to call 911.   Upon EMS arrival, patient was found on carpeted flooring in the living room in right lateral recumbant position. Pt is alert and was able to say it was July 2023.   Glucose 132  Large left upper arm, and right lower extremity skin tear. Dried blood noted on her occiput.

## 2021-11-23 NOTE — Assessment & Plan Note (Signed)
At this point no evidence of infection likely secondary to hemoconcentration we will continue to monitor for any sign of infectious process suspect will improve with IV fluid rehydration

## 2021-11-24 ENCOUNTER — Inpatient Hospital Stay (HOSPITAL_COMMUNITY): Payer: Medicare Other

## 2021-11-24 DIAGNOSIS — R55 Syncope and collapse: Secondary | ICD-10-CM

## 2021-11-24 DIAGNOSIS — E039 Hypothyroidism, unspecified: Secondary | ICD-10-CM

## 2021-11-24 DIAGNOSIS — R338 Other retention of urine: Secondary | ICD-10-CM

## 2021-11-24 DIAGNOSIS — L8990 Pressure ulcer of unspecified site, unspecified stage: Secondary | ICD-10-CM | POA: Insufficient documentation

## 2021-11-24 DIAGNOSIS — M6282 Rhabdomyolysis: Secondary | ICD-10-CM

## 2021-11-24 DIAGNOSIS — D72829 Elevated white blood cell count, unspecified: Secondary | ICD-10-CM | POA: Diagnosis not present

## 2021-11-24 DIAGNOSIS — N179 Acute kidney failure, unspecified: Secondary | ICD-10-CM | POA: Diagnosis not present

## 2021-11-24 LAB — TROPONIN I (HIGH SENSITIVITY)
Troponin I (High Sensitivity): 61 ng/L — ABNORMAL HIGH (ref <18)
Troponin I (High Sensitivity): 52 ng/L — ABNORMAL HIGH (ref <18)
Troponin I (High Sensitivity): 55 ng/L — ABNORMAL HIGH (ref <18)

## 2021-11-24 LAB — CBC
HCT: 31.9 % — ABNORMAL LOW (ref 36.0–46.0)
Hemoglobin: 10.6 g/dL — ABNORMAL LOW (ref 12.0–15.0)
MCH: 34.1 pg — ABNORMAL HIGH (ref 26.0–34.0)
MCHC: 33.2 g/dL (ref 30.0–36.0)
MCV: 102.6 fL — ABNORMAL HIGH (ref 80.0–100.0)
Platelets: 145 10*3/uL — ABNORMAL LOW (ref 150–400)
RBC: 3.11 MIL/uL — ABNORMAL LOW (ref 3.87–5.11)
RDW: 19.4 % — ABNORMAL HIGH (ref 11.5–15.5)
WBC: 17.3 10*3/uL — ABNORMAL HIGH (ref 4.0–10.5)
nRBC: 0.8 % — ABNORMAL HIGH (ref 0.0–0.2)

## 2021-11-24 LAB — URINALYSIS, COMPLETE (UACMP) WITH MICROSCOPIC
Bilirubin Urine: NEGATIVE
Glucose, UA: 50 mg/dL — AB
Ketones, ur: NEGATIVE mg/dL
Nitrite: NEGATIVE
Protein, ur: 30 mg/dL — AB
Specific Gravity, Urine: 1.017 (ref 1.005–1.030)
pH: 5 (ref 5.0–8.0)

## 2021-11-24 LAB — OSMOLALITY: Osmolality: 332 mOsm/kg (ref 275–295)

## 2021-11-24 LAB — COMPREHENSIVE METABOLIC PANEL
ALT: 31 U/L (ref 0–44)
AST: 57 U/L — ABNORMAL HIGH (ref 15–41)
Albumin: 3.3 g/dL — ABNORMAL LOW (ref 3.5–5.0)
Alkaline Phosphatase: 51 U/L (ref 38–126)
Anion gap: 18 — ABNORMAL HIGH (ref 5–15)
BUN: 84 mg/dL — ABNORMAL HIGH (ref 8–23)
CO2: 21 mmol/L — ABNORMAL LOW (ref 22–32)
Calcium: 8.4 mg/dL — ABNORMAL LOW (ref 8.9–10.3)
Chloride: 104 mmol/L (ref 98–111)
Creatinine, Ser: 3.01 mg/dL — ABNORMAL HIGH (ref 0.44–1.00)
GFR, Estimated: 13 mL/min — ABNORMAL LOW (ref >60)
Glucose, Bld: 107 mg/dL — ABNORMAL HIGH (ref 70–99)
Potassium: 3.9 mmol/L (ref 3.5–5.1)
Sodium: 143 mmol/L (ref 135–145)
Total Bilirubin: 1.9 mg/dL — ABNORMAL HIGH (ref 0.3–1.2)
Total Protein: 5.8 g/dL — ABNORMAL LOW (ref 6.5–8.1)

## 2021-11-24 LAB — ECHOCARDIOGRAM COMPLETE
Area-P 1/2: 2.97 cm2
Height: 59 in
S' Lateral: 1.8 cm
Weight: 1329.81 oz

## 2021-11-24 LAB — PHOSPHORUS: Phosphorus: 4.6 mg/dL (ref 2.5–4.6)

## 2021-11-24 LAB — IRON AND TIBC
Iron: 97 ug/dL (ref 28–170)
Saturation Ratios: 43 % — ABNORMAL HIGH (ref 10.4–31.8)
TIBC: 227 ug/dL — ABNORMAL LOW (ref 250–450)
UIBC: 130 ug/dL

## 2021-11-24 LAB — T4, FREE: Free T4: 0.67 ng/dL (ref 0.61–1.12)

## 2021-11-24 LAB — TSH: TSH: 7.583 u[IU]/mL — ABNORMAL HIGH (ref 0.350–4.500)

## 2021-11-24 LAB — OSMOLALITY, URINE: Osmolality, Ur: 561 mOsm/kg (ref 300–900)

## 2021-11-24 LAB — CREATININE, URINE, RANDOM: Creatinine, Urine: 131 mg/dL

## 2021-11-24 LAB — PREALBUMIN: Prealbumin: 8 mg/dL — ABNORMAL LOW (ref 18–38)

## 2021-11-24 LAB — CK: Total CK: 424 U/L — ABNORMAL HIGH (ref 38–234)

## 2021-11-24 LAB — FOLATE: Folate: 22.9 ng/mL (ref >5.9)

## 2021-11-24 LAB — VITAMIN B12: Vitamin B-12: 1227 pg/mL — ABNORMAL HIGH (ref 180–914)

## 2021-11-24 LAB — MAGNESIUM: Magnesium: 2.3 mg/dL (ref 1.7–2.4)

## 2021-11-24 LAB — SODIUM, URINE, RANDOM: Sodium, Ur: 18 mmol/L

## 2021-11-24 LAB — FERRITIN: Ferritin: 810 ng/mL — ABNORMAL HIGH (ref 11–307)

## 2021-11-24 MED ORDER — PANTOPRAZOLE SODIUM 40 MG PO TBEC
40.0000 mg | DELAYED_RELEASE_TABLET | Freq: Every day | ORAL | Status: DC
  Administered 2021-11-25 – 2021-11-28 (×4): 40 mg via ORAL
  Filled 2021-11-24 (×5): qty 1

## 2021-11-24 MED ORDER — DORZOLAMIDE HCL-TIMOLOL MAL 2-0.5 % OP SOLN
1.0000 [drp] | Freq: Two times a day (BID) | OPHTHALMIC | Status: DC
Start: 2021-11-24 — End: 2021-11-28
  Administered 2021-11-24 – 2021-11-28 (×8): 1 [drp] via OPHTHALMIC
  Filled 2021-11-24: qty 10

## 2021-11-24 MED ORDER — THIAMINE HCL 100 MG PO TABS
100.0000 mg | ORAL_TABLET | Freq: Every day | ORAL | Status: DC
  Administered 2021-11-24 – 2021-11-28 (×5): 100 mg via ORAL
  Filled 2021-11-24 (×5): qty 1

## 2021-11-24 MED ORDER — PNEUMOCOCCAL 20-VAL CONJ VACC 0.5 ML IM SUSY
0.5000 mL | PREFILLED_SYRINGE | INTRAMUSCULAR | Status: AC
  Administered 2021-11-25: 0.5 mL via INTRAMUSCULAR
  Filled 2021-11-24: qty 0.5

## 2021-11-24 MED ORDER — AMLODIPINE BESYLATE 5 MG PO TABS
5.0000 mg | ORAL_TABLET | Freq: Every day | ORAL | Status: DC
  Administered 2021-11-24 – 2021-11-28 (×5): 5 mg via ORAL
  Filled 2021-11-24 (×5): qty 1

## 2021-11-24 MED ORDER — ACETAMINOPHEN 650 MG RE SUPP: 650.0000 mg | Freq: Four times a day (QID) | RECTAL | Status: DC | PRN

## 2021-11-24 MED ORDER — SODIUM CHLORIDE 0.9 % IV SOLN: INTRAVENOUS | Status: DC

## 2021-11-24 MED ORDER — HYDROCODONE-ACETAMINOPHEN 5-325 MG PO TABS: 1.0000 | ORAL_TABLET | ORAL | Status: DC | PRN

## 2021-11-24 MED ORDER — LEVOTHYROXINE SODIUM 50 MCG PO TABS: 75.0000 ug | ORAL_TABLET | Freq: Every day | ORAL | Status: DC

## 2021-11-24 MED ORDER — ASPIRIN 81 MG PO CHEW
81.0000 mg | CHEWABLE_TABLET | Freq: Every day | ORAL | Status: DC
  Administered 2021-11-24 – 2021-11-28 (×5): 81 mg via ORAL
  Filled 2021-11-24 (×5): qty 1

## 2021-11-24 MED ORDER — LATANOPROST 0.005 % OP SOLN
1.0000 [drp] | Freq: Every day | OPHTHALMIC | Status: DC
  Administered 2021-11-24 – 2021-11-27 (×4): 1 [drp] via OPHTHALMIC
  Filled 2021-11-24: qty 2.5

## 2021-11-24 MED ORDER — LEVOTHYROXINE SODIUM 50 MCG PO TABS
50.0000 ug | ORAL_TABLET | Freq: Every day | ORAL | Status: DC
  Administered 2021-11-25 – 2021-11-28 (×4): 50 ug via ORAL
  Filled 2021-11-24 (×4): qty 1

## 2021-11-24 MED ORDER — ENSURE ENLIVE PO LIQD
237.0000 mL | Freq: Two times a day (BID) | ORAL | Status: DC
Start: 2021-11-24 — End: 2021-11-28
  Administered 2021-11-24 – 2021-11-28 (×7): 237 mL via ORAL

## 2021-11-24 MED ORDER — ACETAMINOPHEN 325 MG PO TABS
650.0000 mg | ORAL_TABLET | Freq: Four times a day (QID) | ORAL | Status: DC | PRN
  Administered 2021-11-24: 650 mg via ORAL
  Filled 2021-11-24: qty 2

## 2021-11-24 MED ORDER — ADULT MULTIVITAMIN W/MINERALS CH
1.0000 | ORAL_TABLET | Freq: Every day | ORAL | Status: DC
Start: 2021-11-24 — End: 2021-11-28
  Administered 2021-11-24 – 2021-11-28 (×5): 1 via ORAL
  Filled 2021-11-24 (×5): qty 1

## 2021-11-24 MED ORDER — FOLIC ACID 1 MG PO TABS
1.0000 mg | ORAL_TABLET | Freq: Every day | ORAL | Status: DC
  Administered 2021-11-24 – 2021-11-28 (×5): 1 mg via ORAL
  Filled 2021-11-24 (×5): qty 1

## 2021-11-24 NOTE — Evaluation (Signed)
Physical Therapy Evaluation Patient Details Name: Tabitha Harris MRN: [id number] DOB: 1920-01-10 Today's Date: 11/24/2021  History of Present Illness  Pt admitted from home s/p fall and on floor for undetermined amount of time.  Pt wtih hs ov CHF, reast CA, and CVA (no residual affects)  Clinical Impression  Pt admitted as above and presenting with functional mobility limitations 2* generalized weakness, limited endurance, balance deficits, and anxiety related to falling. This date, pt requiring assist of 2 for safe performance of limited basic mobility tasks and would benefit from follow up rehab at SNF level to maximize IND and safety.     Recommendations for follow up therapy are one component of a multi-disciplinary discharge planning process, led by the attending physician.  Recommendations may be updated based on patient status, additional functional criteria and insurance authorization.  Follow Up Recommendations Skilled nursing-short term rehab (<3 hours/day) Can patient physically be transported by private vehicle: Yes    Assistance Recommended at Discharge Frequent or constant Supervision/Assistance  Patient can return home with the following  A lot of help with walking and/or transfers;A little help with bathing/dressing/bathroom;Assistance with cooking/housework;Assist for transportation;Help with stairs or ramp for entrance    Equipment Recommendations Rolling walker (2 wheels) (youth level rolling walker)  Recommendations for Other Services       Functional Status Assessment Patient has had a recent decline in their functional status and demonstrates the ability to make significant improvements in function in a reasonable and predictable amount of time.     Precautions / Restrictions Precautions Precautions: Fall Restrictions Weight Bearing Restrictions: No      Mobility  Bed Mobility Overal bed mobility: Needs Assistance Bed Mobility: Supine to Sit, Sit to  Supine     Supine to sit: Mod assist, +2 for physical assistance, +2 for safety/equipment Sit to supine: Mod assist, +2 for physical assistance, +2 for safety/equipment   General bed mobility comments: Cues for sequence with assist for LEs, to control trunk and to complete rotation with bedpad    Transfers Overall transfer level: Needs assistance Equipment used: Rolling walker (2 wheels) Transfers: Sit to/from Stand Sit to Stand: Min assist, +2 safety/equipment           General transfer comment: assist to bring wt up and fwd and to balance in intial standing with RW    Ambulation/Gait Ambulation/Gait assistance: Min assist, +2 physical assistance, +2 safety/equipment Gait Distance (Feet): 3 Feet Assistive device: Rolling walker (2 wheels) Gait Pattern/deviations: Step-to pattern, Decreased step length - right, Decreased step length - left, Shuffle, Trunk flexed Gait velocity: decr     General Gait Details: Pt side-stepped up side of bed only with RW and min assist for balance/support.  Pt very anxious regarding ability to step away from bedside  Stairs            Wheelchair Mobility    Modified Rankin (Stroke Patients Only)       Balance Overall balance assessment: Needs assistance Sitting-balance support: Feet unsupported, No upper extremity supported Sitting balance-Leahy Scale: Fair     Standing balance support: Bilateral upper extremity supported Standing balance-Leahy Scale: Poor                               Pertinent Vitals/Pain Pain Assessment Pain Assessment: No/denies pain    Home Living Family/patient expects to be discharged to:: Private residence Living Arrangements: Alone   Type of Home: Other(Comment) (town  home) Home Access: Level entry     Alternate Level Stairs-Number of Steps: flight Home Layout: Two level;Bed/bath upstairs Home Equipment: Cane - single point Additional Comments: Medical alert button    Prior  Function Prior Level of Function : Independent/Modified Independent             Mobility Comments: used cane as needed       Hand Dominance   Dominant Hand: Right    Extremity/Trunk Assessment   Upper Extremity Assessment Upper Extremity Assessment: Generalized weakness (noted dressings in place over skin tears)    Lower Extremity Assessment Lower Extremity Assessment: Generalized weakness (noted dressings in place over skin tears)    Cervical / Trunk Assessment Cervical / Trunk Assessment: Kyphotic  Communication   Communication: HOH  Cognition Arousal/Alertness: Awake/alert Behavior During Therapy: Anxious Overall Cognitive Status: Difficult to assess                                          General Comments      Exercises     Assessment/Plan    PT Assessment Patient needs continued PT services  PT Problem List Decreased strength;Decreased activity tolerance;Decreased balance;Decreased mobility;Decreased knowledge of use of DME;Decreased safety awareness       PT Treatment Interventions DME instruction;Gait training;Stair training;Functional mobility training;Therapeutic activities;Therapeutic exercise;Balance training;Patient/family education    PT Goals (Current goals can be found in the Care Plan section)  Acute Rehab PT Goals Patient Stated Goal: Get warm PT Goal Formulation: With patient Time For Goal Achievement: 12/08/21 Potential to Achieve Goals: Fair    Frequency Min 3X/week     Co-evaluation               AM-PAC PT "6 Clicks" Mobility  Outcome Measure Help needed turning from your back to your side while in a flat bed without using bedrails?: A Little Help needed moving from lying on your back to sitting on the side of a flat bed without using bedrails?: A Lot Help needed moving to and from a bed to a chair (including a wheelchair)?: A Lot Help needed standing up from a chair using your arms (e.g., wheelchair or  bedside chair)?: A Lot Help needed to walk in hospital room?: Total Help needed climbing 3-5 steps with a railing? : Total 6 Click Score: 11    End of Session Equipment Utilized During Treatment: Gait belt Activity Tolerance: Patient tolerated treatment well;Patient limited by fatigue;Other (comment) (anxiety, fear of falling) Patient left: in bed;with call bell/phone within reach;with bed alarm set Nurse Communication: Mobility status PT Visit Diagnosis: Unsteadiness on feet (R26.81);Muscle weakness (generalized) (M62.81);Difficulty in walking, not elsewhere classified (R26.2);History of falling (Z91.81)    Time: 0272-5366 PT Time Calculation (min) (ACUTE ONLY): 18 min   Charges:   PT Evaluation $PT Eval Low Complexity: 1 Low          Debe Coder PT Acute Rehabilitation Services Pager [phone] Office [phone]   Burdette Forehand 11/24/2021, 11:10 AM

## 2021-11-24 NOTE — Assessment & Plan Note (Addendum)
-   Patient required straight cath after admission due to retention and continued to have ongoing retention with inability to void - strength improved some during hospitalization and renal function normalized - foley continued at discharge until patient more mobile and stronger; would try to remove foley in 1-2 days and perform trial of void

## 2021-11-24 NOTE — Assessment & Plan Note (Signed)
-   Folate and B12 normal

## 2021-11-24 NOTE — Progress Notes (Signed)
Initial Nutrition Assessment  DOCUMENTATION CODES:   Underweight  INTERVENTION:  - continue Ensure Plus High Protein BID, each supplement provides 350 kcal and 20 grams of protein.  - liberalize diet from Heart Healthy to Regular.  - complete NFPE when feasible.    NUTRITION DIAGNOSIS:   Inadequate oral intake related to acute illness, chronic illness as evidenced by per patient/family report, meal completion < 50%.  GOAL:   Patient will meet greater than or equal to 90% of their needs  MONITOR:   PO intake, Supplement acceptance, Labs, Weight trends  REASON FOR ASSESSMENT:   Malnutrition Screening Tool, Consult Assessment of nutrition requirement/status  ASSESSMENT:   88 y.o. female with medical history of TIA, HTN, HLD, breast cancer, anemia, HOH, hypothyroidism, and GERD. She presented to the ED after a fall with unknown amount of downtime. Her neighbors noted that 3 newspapers had piled up and they called EMS. When they arrived, EMS found her on her R side with large LUE and RLE skin tears and head injury. She lives alone and reported she had not been eating or taking her medications. She was admitted due to AKI.  ECHO being finished at the time of RD visit. No visitors present. Patient sleeping and noted to be hard of hearing.   RD introduced self and asked if NFPE could be done. Patient declined and requested to be allowed to rest and to be left alone at this time.  Able to talk with RN who shares patient ate a little less than 50% of breakfast and about the same at lunch.   Weight today is 83 lb and PTA the most recently documented weight was 98 lb on 08/31/20. This indicates 15 lb weight loss (15.3% body weight) in the past 15 months.   No information documented in the edema section of flow sheet.    Labs reviewed; BUN: 84 mg/dl, creatinikne: 3.01 mg/dl, Ca: 8.4 mg/dl, GFR: 13 ml/min.  Medications reviewed; 1 mg folvite/day, 50 mcg oral synthroid/day, 1 tablet  multivitamin with minerals/day, 40 mg oral protonix/day, 100 mg thiamine/day.   IVF; NS @ 100 ml/hr.    NUTRITION - FOCUSED PHYSICAL EXAM:  Patient declines.  Diet Order:   Diet Order             Diet regular Room service appropriate? Yes; Fluid consistency: Thin  Diet effective now                   EDUCATION NEEDS:   No education needs have been identified at this time  Skin:  Skin Assessment: Skin Integrity Issues: Skin Integrity Issues:: Stage I Stage I: sacrum  Last BM:  PTA/unknown  Height:   Ht Readings from Last 1 Encounters:  11/24/21 '4\' 11"'$  (1.499 m)    Weight:   Wt Readings from Last 1 Encounters:  11/24/21 37.7 kg     BMI:  Body mass index is 16.79 kg/m.  Estimated Nutritional Needs:  Kcal:  1100-1350 kcal Protein:  55-70 grams Fluid:  >/= 1.3 L/day      Jarome Matin, MS, RD, LDN, CNSC Registered Dietitian II Inpatient Clinical Nutrition RD pager # and on-call/weekend pager # available in [facility]

## 2021-11-24 NOTE — Assessment & Plan Note (Signed)
-   No ischemic changes on EKG and patient chest pain-free.  Some elevation attributed to renal dysfunction -Trops have downtrended, no further work-up or trending necessary

## 2021-11-24 NOTE — Assessment & Plan Note (Signed)
Continue aspirin 

## 2021-11-24 NOTE — Progress Notes (Signed)
       REMOTE CROSS COVER NOTE  NAME: Tabitha Harris MRN: [id number] DOB : 1920/02/06    Date of Service   11/24/2021  HPI/Events of Note   Notified by nursing that [name] had a bladder scan volume of 800+ mL and was only able to void 114m when helped to bedside commode.  Interventions   Plan: In and Out Cath x1     This document was prepared using Dragon voice recognition software and may include unintentional dictation errors.  KNeomia GlassDNP, MHA, FNP-BC Nurse Practitioner [facility] CProw[facility] CenterPager ([phone]

## 2021-11-24 NOTE — Assessment & Plan Note (Addendum)
-   Suspected multifactorial in setting of hemoconcentration and reactive.  Low suspicion for infection at this time

## 2021-11-24 NOTE — Assessment & Plan Note (Addendum)
-   mild, due to being down for u/k amount of time - CK 456 on admission -CK normalized after fluids

## 2021-11-24 NOTE — Assessment & Plan Note (Signed)
-   Likely no further mortality benefit for statin.  Okay to discontinue at this time

## 2021-11-24 NOTE — Assessment & Plan Note (Signed)
-   No signs of exacerbation.  Patient appears hypovolemic especially in setting of AKI -Echo notes EF 60 to 65% with grade 2 diastolic dysfunction

## 2021-11-24 NOTE — Evaluation (Signed)
Occupational Therapy Evaluation Patient Details Name: Tabitha Harris MRN: [id number] DOB: 07-13-1919 Today's Date: 11/24/2021   History of Present Illness Pt admitted from home s/p fall and on floor for undetermined amount of time.  Pt wtih hs ov CHF, reast CA, and CVA (no residual affects)   Clinical Impression   Patient is currently requiring assistance with ADLs including moderate to maximum assist with Lower body ADLs, Minimal assist with Upper body ADLs,  as well as  up to maximum assist with bed mobility and moderate assist with functional transfers to toilet with RW.  Current level of function may be below patient's typical baseline, however pt is an unreliable historian with severe memory deficits to point of being unsure whether she has a bathroom on her 1st floor of her home or what kind of shower she has "I'm not sure".   During this evaluation, patient was further limited by generalized weakness, impaired activity tolerance, hardness of hearing and pain to buttocks primarily, all of which has the potential to impact patient's safety and independence during functional mobility, as well as performance for ADLs.  Patient lives alone and is not quiet sure but "I think I have a contract person".  Presumed to be a hired caregiver but pt cannot state name of possible CNA or company used. Pt does not know how often the PCA comes to the home but does not think it's everyday. Pt unsure what the CNA does to help her.  It is noted that pt has a medical alert system but failed to engage it while shwe was on the floor.  Pt does not recall fall or that she was on the ground waiting for help.  Pt currently does not have the mobility or cognitive needs to safely care for herself at home.  Patient demonstrates good rehab potential, and should benefit from continued skilled occupational therapy services while in acute care to maximize safety, independence and quality of life at home.  Continued occupational  therapy services in a SNF setting.  Pt may need to transition from home to a level of care with increased assistance and supervision such as ALF.   Pt denies any family members who are available to help her.    Recommendations for follow up therapy are one component of a multi-disciplinary discharge planning process, led by the attending physician.  Recommendations may be updated based on patient status, additional functional criteria and insurance authorization.   Follow Up Recommendations  Skilled nursing-short term rehab (<3 hours/day)    Assistance Recommended at Discharge Frequent or constant Supervision/Assistance  Patient can return home with the following A lot of help with bathing/dressing/bathroom;A lot of help with walking and/or transfers;Direct supervision/assist for financial management;Assistance with cooking/housework;Assist for transportation;Help with stairs or ramp for entrance;Direct supervision/assist for medications management    Functional Status Assessment  Patient has had a recent decline in their functional status and demonstrates the ability to make significant improvements in function in a reasonable and predictable amount of time.  Equipment Recommendations  Tub/shower seat    Recommendations for Other Services       Precautions / Restrictions Precautions Precautions: Fall Restrictions Weight Bearing Restrictions: No      Mobility Bed Mobility Overal bed mobility: Needs Assistance Bed Mobility: Rolling, Sidelying to Sit Rolling: Mod assist Sidelying to sit: Mod assist   Sit to supine: Max assist   General bed mobility comments: Poor trunk control when returning to supine. Required assist with LEs and trunk  Transfers                          Balance Overall balance assessment: Needs assistance Sitting-balance support: Feet unsupported, No upper extremity supported Sitting balance-Leahy Scale: Poor Sitting balance - Comments: Fair  static/Poor dynamic. Pt often leans FAR forward without warning.   Standing balance support: Bilateral upper extremity supported, During functional activity, Reliant on assistive device for balance Standing balance-Leahy Scale: Poor                             ADL either performed or assessed with clinical judgement   ADL Overall ADL's : Needs assistance/impaired Eating/Feeding: Set up;Supervision/ safety   Grooming: Wash/dry hands;Sitting;Set up;Supervision/safety   Upper Body Bathing: Minimal assistance;Sitting   Lower Body Bathing: Maximal assistance;Sitting/lateral leans;Sit to/from stand   Upper Body Dressing : Minimal assistance;Sitting   Lower Body Dressing: Maximal assistance;Sitting/lateral leans;Sit to/from stand   Toilet Transfer: Moderate assistance;BSC/3in1;Stand-pivot;Rolling walker (2 wheels) Toilet Transfer Details (indicate cue type and reason): Pt stood from EOB with Mod As and cues for hands. Pt used RW to pivot to [facility] with Min-Mod As and cues to seperate feet as pt stepping one foot on top of another. Toileting- Clothing Manipulation and Hygiene: Maximal assistance;Sit to/from stand;Sitting/lateral lean Toileting - Clothing Manipulation Details (indicate cue type and reason): Pt unable to void on [facility] after multiple attempts and several minutes. RN performed bladder scan. Pt required Max As for clother management. Hygiene NT     Functional mobility during ADLs: Minimal assistance;Moderate assistance;Rolling walker (2 wheels);Cueing for sequencing;Cueing for safety       Vision   Vision Assessment?: No apparent visual deficits     Perception     Praxis      Pertinent Vitals/Pain Pain Assessment Pain Assessment: Faces Faces Pain Scale: Hurts even more Pain Location: [place].  Spoke with Ron, Unit secretarty who agreed to order air cushion for bed and chair. Pain Descriptors / Indicators: Grimacing, Sore Pain Intervention(s): Limited activity  within patient's tolerance, Monitored during session (RN in room)     Hand Dominance Right   Extremity/Trunk Assessment Upper Extremity Assessment Upper Extremity Assessment: Generalized weakness (dressings covering skin tears)   Lower Extremity Assessment Lower Extremity Assessment: Generalized weakness (dressings covering skin tears)   Cervical / Trunk Assessment Cervical / Trunk Assessment: Kyphotic   Communication Communication Communication: HOH   Cognition Arousal/Alertness: Awake/alert Behavior During Therapy: Anxious Overall Cognitive Status: No family/caregiver present to determine baseline cognitive functioning Area of Impairment: Memory, Orientation                 Orientation Level: Disoriented to, Time, Situation ("I don't know why I'm here.")   Memory: Decreased short-term memory               General Comments       Exercises     Shoulder Instructions      Home Living Family/patient expects to be discharged to:: Private residence Living Arrangements: Alone Available Help at Discharge: Available PRN/intermittently;Other (Comment) (Pt is unsure, but thinks that she is contracted with a PCA who comes "from time to time." Pt does not recall in what way her PCA assists her.) Type of Home: Other(Comment) (townhome) Home Access: Level entry     Home Layout: Two level;Bed/bath upstairs Alternate Level Stairs-Number of Steps: flight Alternate Level Stairs-Rails: Right;Left Bathroom Shower/Tub: Tub/shower unit (Pt admits that she does  not remember if she has a walk in or a tub/shower on the ground floor. She beileves that she has a tub/shower up)   Biochemist, clinical: Standard     Home Equipment: Cane - single point;Grab bars - tub/shower   Additional Comments: Medical alert button      Prior Functioning/Environment Prior Level of Function : Independent/Modified Independent             Mobility Comments: used cane as needed ADLs Comments:  Pt reports that she showers standing up, dresses herself and performs her own BADLs. Pt is unsure about IADLs but thinks she has a PCA who assists.        OT Problem List: Decreased strength;Pain;Decreased cognition;Decreased activity tolerance;Decreased safety awareness;Decreased knowledge of use of DME or AE;Impaired balance (sitting and/or standing);Decreased knowledge of precautions      OT Treatment/Interventions: Self-care/ADL training;Therapeutic exercise;Therapeutic activities;Cognitive remediation/compensation;Patient/family education;DME and/or AE instruction;Balance training    OT Goals(Current goals can be found in the care plan section) Acute Rehab OT Goals OT Goal Formulation: Patient unable to participate in goal setting Time For Goal Achievement: 12/08/21 Potential to Achieve Goals: Fair ADL Goals Pt Will Perform Grooming: sitting;with set-up (with good static and dynamic sitting balance) Pt Will Perform Lower Body Bathing: with supervision;sitting/lateral leans;sit to/from stand Pt Will Perform Lower Body Dressing: with set-up;with supervision;sitting/lateral leans;sit to/from stand;with adaptive equipment Pt Will Transfer to Toilet: with modified independence;ambulating;regular height toilet Pt Will Perform Toileting - Clothing Manipulation and hygiene: with modified independence Pt/caregiver will Perform Home Exercise Program: Increased strength;Both right and left upper extremity;With Supervision  OT Frequency: Min 2X/week    Co-evaluation              AM-PAC OT "6 Clicks" Daily Activity     Outcome Measure Help from another person eating meals?: A Little Help from another person taking care of personal grooming?: A Little Help from another person toileting, which includes using toliet, bedpan, or urinal?: A Lot Help from another person bathing (including washing, rinsing, drying)?: A Lot Help from another person to put on and taking off regular upper body  clothing?: A Little Help from another person to put on and taking off regular lower body clothing?: A Lot 6 Click Score: 15   End of Session Equipment Utilized During Treatment: Gait belt;Rolling walker (2 wheels) Nurse Communication: Other (comment) (RN, Engineer, civil (consulting) and CNA in room to assist with toileting needs.)  Activity Tolerance: Patient tolerated treatment well Patient left: in bed;with call bell/phone within reach;with bed alarm set  OT Visit Diagnosis: Other symptoms and signs involving cognitive function;Pain;History of falling (Z91.81);Muscle weakness (generalized) (M62.81);Unsteadiness on feet (R26.81) Pain - Right/Left: Right Pain - part of body: Leg (Buttocks)                Time: 3382-5053 OT Time Calculation (min): 32 min Charges:  OT General Charges $OT Visit: 1 Visit OT Evaluation $OT Eval Low Complexity: 1 Low OT Treatments $Self Care/Home Management : 8-22 mins  Anderson Malta, OT Acute Rehab Services Office: [phone] 11/24/2021  Julien Girt 11/24/2021, 1:58 PM

## 2021-11-24 NOTE — NC FL2 (Signed)
[place] LEVEL OF CARE SCREENING TOOL     IDENTIFICATION  Patient Name: Tabitha Harris Birthdate: 1919-11-01 Sex: female Admission Date (Current Location): 11/23/2021  [facility] and [place] Number:  Herbalist and Address:  [facility],  [address] [place], [place]      Provider Number: [id number]  Attending Physician Name and Address:  Dwyane Dee, MD  Relative Name and Phone Number:  Daugher-in-law Sofi Bryars [phone]    Current Level of Care: Hospital Recommended Level of Care: [place] Prior Approval Number:    Date Approved/Denied:   PASRR Number: [id number]  Discharge Plan: SNF    Current Diagnoses: Patient Active Problem List   Diagnosis Date Noted   Pressure injury of skin 11/24/2021   Hypothyroidism 11/24/2021   Rhabdomyolysis 11/24/2021   Acute urinary retention 11/24/2021   AKI (acute kidney injury) ([place]) 11/23/2021   Fall at home, initial encounter 11/23/2021   Hip pain 11/23/2021   Chronic diastolic CHF (congestive heart failure) ([place]) 11/23/2021   Leukocytosis 11/23/2021   Macrocytic anemia 08/31/2020   Amaurosis fugax, right eye 10/04/2015   Postoperative anemia due to acute blood loss    Vocal cord paralysis, unilateral complete    Carotid stenosis    Preoperative cardiovascular examination    HLD (hyperlipidemia)    Essential hypertension    Esophageal reflux    History of TIA (transient ischemic attack) 09/20/2015   Stroke-like symptom 09/20/2015   hx: breast cancer, left UOQ, invasive lobular, receptor + her 2 - 05/25/2011    Orientation RESPIRATION BLADDER Height & Weight     Self, Time, Situation, Place  Normal Continent Weight: 83 lb 1.8 oz (37.7 kg) Height:  '4\' 11"'$  (149.9 cm)  BEHAVIORAL SYMPTOMS/MOOD NEUROLOGICAL BOWEL NUTRITION STATUS      Continent Diet (Regular)  AMBULATORY STATUS COMMUNICATION OF NEEDS Skin   Limited Assist Verbally Normal                        Personal Care Assistance Level of Assistance  Bathing, Feeding, Dressing Bathing Assistance: Limited assistance Feeding assistance: Limited assistance Dressing Assistance: Limited assistance     Functional Limitations Info  Sight, Hearing, Speech Sight Info: Adequate Hearing Info: Impaired Speech Info: Adequate    SPECIAL CARE FACTORS FREQUENCY  PT (By licensed PT), OT (By licensed OT)     PT Frequency: 5x/wk OT Frequency: 5x/wk            Contractures Contractures Info: Not present    Additional Factors Info  Code Status, Allergies Code Status Info: DNR Allergies Info: Codeine           Current Medications (11/24/2021):  This is the current hospital active medication list Current Facility-Administered Medications  Medication Dose Route Frequency Provider Last Rate Last Admin   0.9 %  sodium chloride infusion   Intravenous Continuous Dwyane Dee, MD 100 mL/hr at 11/24/21 0953 New Bag at 11/24/21 0953   acetaminophen (TYLENOL) tablet 650 mg  650 mg Oral Q6H PRN Toy Baker, MD       Or   acetaminophen (TYLENOL) suppository 650 mg  650 mg Rectal Q6H PRN Doutova, Anastassia, MD       amLODipine (NORVASC) tablet 5 mg  5 mg Oral Daily Doutova, Anastassia, MD   5 mg at 11/24/21 0954   aspirin tablet 81 mg  81 mg Oral Daily Dwyane Dee, MD       dorzolamide-timolol (COSOPT)  22.3-6.8 MG/ML ophthalmic solution 1 drop  1 drop Both Eyes BID Doutova, Anastassia, MD       feeding supplement (ENSURE ENLIVE / ENSURE PLUS) liquid 237 mL  237 mL Oral BID BM Doutova, Anastassia, MD   237 mL at 69/62/95 2841   folic acid (FOLVITE) tablet 1 mg  1 mg Oral Daily Doutova, Anastassia, MD   1 mg at 11/24/21 0954   HYDROcodone-acetaminophen (NORCO/VICODIN) 5-325 MG per tablet 1-2 tablet  1-2 tablet Oral Q4H PRN Doutova, Anastassia, MD       latanoprost (XALATAN) 0.005 % ophthalmic solution 1 drop  1 drop Both Eyes QHS Toy Baker, MD       [START ON 11/25/2021]  levothyroxine (SYNTHROID) tablet 50 mcg  50 mcg Oral Daily Dwyane Dee, MD       multivitamin with minerals tablet 1 tablet  1 tablet Oral Daily Doutova, Anastassia, MD   1 tablet at 11/24/21 0954   pantoprazole (PROTONIX) EC tablet 40 mg  40 mg Oral Daily Toy Baker, MD       [START ON 11/25/2021] pneumococcal 20-valent conjugate vaccine (PREVNAR 20) injection 0.5 mL  0.5 mL Intramuscular Tomorrow-1000 Dwyane Dee, MD       thiamine (VITAMIN B1) tablet 100 mg  100 mg Oral Daily Doutova, Anastassia, MD   100 mg at 11/24/21 0102     Discharge Medications: Please see discharge summary for a list of discharge medications.  Relevant Imaging Results:  Relevant Lab Results:   Additional Information SSN: [id number]  Vassie Moselle, LCSW

## 2021-11-24 NOTE — Assessment & Plan Note (Addendum)
-   TSH 7.583.  Suspect some unintentional missed doses given advanced age and forgetfulness - Free T4 lower limit of normal, 0.67. TT3 <20  - continue synthroid - needs repeat TSH in 4-6 weeks

## 2021-11-24 NOTE — Consult Note (Signed)
WOC Nurse Consult Note: Patient receiving care in [place] [facility].  Reason for Consult: "wounds" Wound type: Patient was found down after approximately 3 days. She has abrasions to the LUE, bilateral pre-tibial areas along the shins, the right trochanter, and a DTPI to the sacrum Pressure Injury POA: Yes Measurement: sacral DTPI measures 7 cm x 9 cm Wound bed: non-blanchable purple hue tissue Drainage (amount, consistency, odor) none Periwound: intact Dressing procedure/placement/frequency: Gently cleanse abrasions to the following areas with saline, pat dry, then apply vaseline gauze or Xeroform gauze over the abrasions, top with foam dressings: RLE, LLE (along the shins), LUE. Continue the sacral foam dressing to the sacrum, and a foam dressing to the right trochanter.  Change every 3 days and prn soilage.  I spoke with her primary RN, Anderson Malta, and discussed obtaining a bed with air mattress.  The decision was made that staff prefer to turn her frequently. The patient is so small and light weight, staff felt like she would slide too much if on a low air loss mattress.  Monitor the wound area(s) for worsening of condition such as: Signs/symptoms of infection,  Increase in size,  Development of or worsening of odor, Development of pain, or increased pain at the affected locations.  Notify the medical team if any of these develop.  Thank you for the consult.  Discussed plan of care with the patient and bedside nurse.  [place] nurse will not follow at this time.  Please re-consult the [facility] team if needed.  Val Riles, RN, MSN, CWOCN, CNS-BC, pager [phone]

## 2021-11-24 NOTE — Assessment & Plan Note (Addendum)
-   Unclear etiology and patient cannot remember falling or events leading up to it - Syncope work-up initiated on admission - Patient lives alone with no family in the area - s/p PT/OT evals; plan is for SNF at discharge

## 2021-11-24 NOTE — Progress Notes (Addendum)
Progress Note    Tabitha Harris   ONG:[id number]  DOB: 22-Jun-1919  DOA: 11/23/2021     1 PCP: Merrilee Seashore, MD  Initial CC: found down at home  Hospital Course: Tabitha Harris is a 88 year old female with PMH TIA, HTN, HLD, history of breast cancer, hard of hearing, hypothyroidism, GERD who presented after being found down for an unknown amount of time. Neighbors had noticed that the patient had not picked up her newspaper in a few days and called for a well check. Patient did not remember how she fell or why nor how long she was on the ground. Multiple imaging studies were performed on admission. CXR negative for acute abnormalities.  X-ray pelvis also negative for acute fractures.  CT head negative for acute findings and shows underlying cerebral atrophy.  CT C-spine negative for acute fractures and shows underlying chronic degenerative changes. Renal function was worse beyond baseline compared to prior, creatinine 3.66 on admission.  CK was mildly elevated, 456.  Troponins were also indeterminately elevated and down trended after being started on fluids.  EKG was negative for ischemic changes and patient had no complaints of chest pain.  Interval History:  Patient seen eating breakfast comfortably this morning in bed.  She was pleasant, alert, and cooperative.  Did not seem to be in any distress or discomfort.  She does not remember any of the events associated with her fall or how long she was down.  Assessment and Plan: * AKI (acute kidney injury) ([place]) - FeNa 0.3% consistent with pre-renal due to volume depletion in setting of decreased intake from being found down - baseline creatinine ~ 0.8 - patient presents with increase in creat >0.3 mg/dL above baseline, creat increase >1.5x baseline presumed to have occurred within past 7 days PTA - creat 3.66 on admission - continue IVF and diet - trend BMP   Acute urinary retention - Patient required straight cath after admission  due to retention and continued to have ongoing retention with inability to void -Given need for ongoing IV fluids and difficulty with retention, prudent to place catheter for now until renal function normalized then pursue a trial of void  Fall at home, initial encounter - Unclear etiology and patient cannot remember falling or events leading up to it - Syncope work-up initiated on admission; follow-up echo - Patient lives alone with no family in the area - Follow-up PT/OT evals  Rhabdomyolysis - mild, due to being down for u/k amount of time - CK 456 on admission - continue trending 1 more day and continue IVF   Hypothyroidism - TSH 7.583.  Suspect some unintentional missed doses given advanced age and forgetfulness - Free T4 lower limit of normal, 0.67 - Will clarify Synthroid dose and resume  Leukocytosis - Suspected multifactorial in setting of hemoconcentration and reactive.  Low suspicion for infection at this time - Continue fluids and trending  Pressure injury of skin - continue wound care rec's  Chronic diastolic CHF (congestive heart failure) (H[facility]) - No signs of exacerbation.  Patient appears hypovolemic especially in setting of AKI - Follow-up echo  Hip pain - Negative for fracture on imaging work-up on admission - Continue supportive care  Macrocytic anemia - Folate and B12 normal  Essential hypertension - Remaining normotensive - Continue amlodipine  HLD (hyperlipidemia) - Likely no further mortality benefit for statin.  Okay to discontinue at this time  History of TIA (transient ischemic attack) - Continue aspirin  Elevated troponin-resolved as of 11/24/2021 -  No ischemic changes on EKG and patient chest pain-free.  Some elevation attributed to renal dysfunction -Trops have downtrended, no further work-up or trending necessary   Old records reviewed in assessment of this patient  Antimicrobials:   DVT prophylaxis:  SCDs Start: 11/24/21  0041   Code Status:   Code Status: DNR  Mobility Assessment (last 72 hours)     Mobility Assessment     Row Name 11/24/21 1100 11/24/21 0856 11/24/21 0033       Does patient have an order for bedrest or is patient medically unstable -- No - Continue assessment No - Continue assessment     What is the highest level of mobility based on the progressive mobility assessment? Level 3 (Stands with assist) - Balance while standing  and cannot march in place Level 2 (Chairfast) - Balance while sitting on edge of bed and cannot stand Level 2 (Chairfast) - Balance while sitting on edge of bed and cannot stand     Is the above level different from baseline mobility prior to current illness? -- Yes - Recommend PT order Yes - Recommend PT order              Disposition Plan: SNF Status is: Inpatient  Objective: Blood pressure 117/63, pulse 83, temperature 97.6 F (36.4 C), temperature source Oral, resp. rate 17, height '4\' 11"'$  (1.499 m), weight 37.7 kg, SpO2 98 %.  Examination:  Physical Exam Constitutional:      General: She is not in acute distress.    Appearance: She is not ill-appearing.  HENT:     Head: Normocephalic and atraumatic.     Comments: Small amount of dried blood noted along posterior right scalp    Mouth/Throat:     Mouth: Mucous membranes are moist.  Eyes:     Extraocular Movements: Extraocular movements intact.  Cardiovascular:     Rate and Rhythm: Normal rate and regular rhythm.  Pulmonary:     Effort: Pulmonary effort is normal.     Breath sounds: Normal breath sounds.  Abdominal:     General: Bowel sounds are normal. There is no distension.     Palpations: Abdomen is soft.     Tenderness: There is no abdominal tenderness.  Musculoskeletal:        General: Normal range of motion.     Cervical back: Normal range of motion and neck supple.  Skin:    General: Skin is warm and dry.  Neurological:     General: No focal deficit present.     Mental Status: She  is alert.  Psychiatric:        Mood and Affect: Mood normal.        Behavior: Behavior normal.      Consultants:    Procedures:    Data Reviewed: Results for orders placed or performed during the hospital encounter of 11/23/21 (from the past 24 hour(s))  CBG monitoring, ED     Status: Abnormal   Collection Time: 11/23/21  8:39 PM  Result Value Ref Range   Glucose-Capillary 111 (H) 70 - 99 mg/dL  Troponin I (High Sensitivity)     Status: Abnormal   Collection Time: 11/23/21  8:40 PM  Result Value Ref Range   Troponin I (High Sensitivity) 73 (H) <18 ng/L  CBC     Status: Abnormal   Collection Time: 11/23/21  8:42 PM  Result Value Ref Range   WBC 17.1 (H) 4.0 - 10.5 K/uL   RBC 3.54 (L)  3.87 - 5.11 MIL/uL   Hemoglobin 12.0 12.0 - 15.0 g/dL   HCT 35.8 (L) 36.0 - 46.0 %   MCV 101.1 (H) 80.0 - 100.0 fL   MCH 33.9 26.0 - 34.0 pg   MCHC 33.5 30.0 - 36.0 g/dL   RDW 19.2 (H) 11.5 - 15.5 %   Platelets 164 150 - 400 K/uL   nRBC 1.2 (H) 0.0 - 0.2 %  CK     Status: Abnormal   Collection Time: 11/23/21  8:42 PM  Result Value Ref Range   Total CK 456 (H) 38 - 234 U/L  Magnesium     Status: Abnormal   Collection Time: 11/23/21  8:42 PM  Result Value Ref Range   Magnesium 2.6 (H) 1.7 - 2.4 mg/dL  Phosphorus     Status: Abnormal   Collection Time: 11/23/21  8:42 PM  Result Value Ref Range   Phosphorus 5.4 (H) 2.5 - 4.6 mg/dL  Comprehensive metabolic panel     Status: Abnormal   Collection Time: 11/23/21  8:42 PM  Result Value Ref Range   Sodium 143 135 - 145 mmol/L   Potassium 4.4 3.5 - 5.1 mmol/L   Chloride 105 98 - 111 mmol/L   CO2 22 22 - 32 mmol/L   Glucose, Bld 123 (H) 70 - 99 mg/dL   BUN 91 (H) 8 - 23 mg/dL   Creatinine, Ser 3.66 (H) 0.44 - 1.00 mg/dL   Calcium 9.0 8.9 - 10.3 mg/dL   Total Protein 6.6 6.5 - 8.1 g/dL   Albumin 3.7 3.5 - 5.0 g/dL   AST 67 (H) 15 - 41 U/L   ALT 34 0 - 44 U/L   Alkaline Phosphatase 57 38 - 126 U/L   Total Bilirubin 2.0 (H) 0.3 - 1.2  mg/dL   GFR, Estimated 11 (L) >60 mL/min   Anion gap 16 (H) 5 - 15  Reticulocytes     Status: Abnormal   Collection Time: 11/23/21  8:42 PM  Result Value Ref Range   Retic Ct Pct 0.8 0.4 - 3.1 %   RBC. 3.47 (L) 3.87 - 5.11 MIL/uL   Retic Count, Absolute 28.1 19.0 - 186.0 K/uL   Immature Retic Fract 14.2 2.3 - 15.9 %  Prealbumin     Status: Abnormal   Collection Time: 11/24/21 12:36 AM  Result Value Ref Range   Prealbumin 8 (L) 18 - 38 mg/dL  Vitamin B12     Status: Abnormal   Collection Time: 11/24/21 12:36 AM  Result Value Ref Range   Vitamin B-12 1,227 (H) 180 - 914 pg/mL  Folate     Status: None   Collection Time: 11/24/21 12:36 AM  Result Value Ref Range   Folate 22.9 >5.9 ng/mL  Iron and TIBC     Status: Abnormal   Collection Time: 11/24/21 12:36 AM  Result Value Ref Range   Iron 97 28 - 170 ug/dL   TIBC 227 (L) 250 - 450 ug/dL   Saturation Ratios 43 (H) 10.4 - 31.8 %   UIBC 130 ug/dL  Ferritin     Status: Abnormal   Collection Time: 11/24/21 12:36 AM  Result Value Ref Range   Ferritin 810 (H) 11 - 307 ng/mL  CK     Status: Abnormal   Collection Time: 11/24/21 12:36 AM  Result Value Ref Range   Total CK 424 (H) 38 - 234 U/L  TSH     Status: Abnormal   Collection Time: 11/24/21 12:36 AM  Result Value Ref Range   TSH 7.583 (H) 0.350 - 4.500 uIU/mL  Osmolality     Status: Abnormal   Collection Time: 11/24/21 12:36 AM  Result Value Ref Range   Osmolality 332 (HH) 275 - 295 mOsm/kg  Troponin I (High Sensitivity)     Status: Abnormal   Collection Time: 11/24/21 12:36 AM  Result Value Ref Range   Troponin I (High Sensitivity) 61 (H) <18 ng/L  T4, free     Status: None   Collection Time: 11/24/21 12:36 AM  Result Value Ref Range   Free T4 0.67 0.61 - 1.12 ng/dL  Comprehensive metabolic panel     Status: Abnormal   Collection Time: 11/24/21  2:00 AM  Result Value Ref Range   Sodium 143 135 - 145 mmol/L   Potassium 3.9 3.5 - 5.1 mmol/L   Chloride 104 98 - 111  mmol/L   CO2 21 (L) 22 - 32 mmol/L   Glucose, Bld 107 (H) 70 - 99 mg/dL   BUN 84 (H) 8 - 23 mg/dL   Creatinine, Ser 3.01 (H) 0.44 - 1.00 mg/dL   Calcium 8.4 (L) 8.9 - 10.3 mg/dL   Total Protein 5.8 (L) 6.5 - 8.1 g/dL   Albumin 3.3 (L) 3.5 - 5.0 g/dL   AST 57 (H) 15 - 41 U/L   ALT 31 0 - 44 U/L   Alkaline Phosphatase 51 38 - 126 U/L   Total Bilirubin 1.9 (H) 0.3 - 1.2 mg/dL   GFR, Estimated 13 (L) >60 mL/min   Anion gap 18 (H) 5 - 15  CBC     Status: Abnormal   Collection Time: 11/24/21  2:00 AM  Result Value Ref Range   WBC 17.3 (H) 4.0 - 10.5 K/uL   RBC 3.11 (L) 3.87 - 5.11 MIL/uL   Hemoglobin 10.6 (L) 12.0 - 15.0 g/dL   HCT 31.9 (L) 36.0 - 46.0 %   MCV 102.6 (H) 80.0 - 100.0 fL   MCH 34.1 (H) 26.0 - 34.0 pg   MCHC 33.2 30.0 - 36.0 g/dL   RDW 19.4 (H) 11.5 - 15.5 %   Platelets 145 (L) 150 - 400 K/uL   nRBC 0.8 (H) 0.0 - 0.2 %  Magnesium     Status: None   Collection Time: 11/24/21  2:00 AM  Result Value Ref Range   Magnesium 2.3 1.7 - 2.4 mg/dL  Phosphorus     Status: None   Collection Time: 11/24/21  2:00 AM  Result Value Ref Range   Phosphorus 4.6 2.5 - 4.6 mg/dL  Troponin I (High Sensitivity)     Status: Abnormal   Collection Time: 11/24/21  2:00 AM  Result Value Ref Range   Troponin I (High Sensitivity) 52 (H) <18 ng/L  Troponin I (High Sensitivity)     Status: Abnormal   Collection Time: 11/24/21  5:24 AM  Result Value Ref Range   Troponin I (High Sensitivity) 55 (H) <18 ng/L  Creatinine, urine, random     Status: None   Collection Time: 11/24/21  5:47 AM  Result Value Ref Range   Creatinine, Urine 131 mg/dL  Osmolality, urine     Status: None   Collection Time: 11/24/21  5:47 AM  Result Value Ref Range   Osmolality, Ur 561 300 - 900 mOsm/kg  Sodium, urine, random     Status: None   Collection Time: 11/24/21  5:47 AM  Result Value Ref Range   Sodium, Ur 18 mmol/L  Urinalysis, Complete  w Microscopic Urine, Clean Catch     Status: Abnormal   Collection  Time: 11/24/21  5:47 AM  Result Value Ref Range   Color, Urine AMBER (A) YELLOW   APPearance CLOUDY (A) CLEAR   Specific Gravity, Urine 1.017 1.005 - 1.030   pH 5.0 5.0 - 8.0   Glucose, UA 50 (A) NEGATIVE mg/dL   Hgb urine dipstick MODERATE (A) NEGATIVE   Bilirubin Urine NEGATIVE NEGATIVE   Ketones, ur NEGATIVE NEGATIVE mg/dL   Protein, ur 30 (A) NEGATIVE mg/dL   Nitrite NEGATIVE NEGATIVE   Leukocytes,Ua LARGE (A) NEGATIVE   RBC / HPF 21-50 0 - 5 RBC/hpf   WBC, UA 21-50 0 - 5 WBC/hpf   Bacteria, UA RARE (A) NONE SEEN   Squamous Epithelial / LPF 0-5 0 - 5   Hyaline Casts, UA PRESENT    Amorphous Crystal PRESENT     I have Reviewed nursing notes, Vitals, and Lab results since pt's last encounter. Pertinent lab results : see above I have ordered test including BMP, CBC, Mg I have reviewed the last note from staff over past 24 hours I have discussed pt's care plan and test results with nursing staff, case manager   LOS: 1 day   Dwyane Dee, MD [facility] 11/24/2021, 12:04 PM

## 2021-11-24 NOTE — Hospital Course (Signed)
Tabitha Harris is a 88 year old female with PMH TIA, HTN, HLD, history of breast cancer, hard of hearing, hypothyroidism, GERD who presented after being found down for an unknown amount of time. Neighbors had noticed that the patient had not picked up her newspaper in a few days and called for a well check. Patient did not remember how she fell or why nor how long she was on the ground. Multiple imaging studies were performed on admission. CXR negative for acute abnormalities.  X-ray pelvis also negative for acute fractures.  CT head negative for acute findings and shows underlying cerebral atrophy.  CT C-spine negative for acute fractures and shows underlying chronic degenerative changes. Renal function was worse beyond baseline compared to prior, creatinine 3.66 on admission.  CK was mildly elevated, 456.  Troponins were also indeterminately elevated and down trended after being started on fluids.  EKG was negative for ischemic changes and patient had no complaints of chest pain.

## 2021-11-24 NOTE — Assessment & Plan Note (Signed)
-   continue wound care rec's

## 2021-11-24 NOTE — Evaluation (Signed)
Clinical/Bedside Swallow Evaluation Patient Details  Name: Tabitha Harris MRN: [id number] Date of Birth: 1919/06/03  Today's Date: 11/24/2021 Time: SLP Start Time (ACUTE ONLY): 4128 SLP Stop Time (ACUTE ONLY): 7867 SLP Time Calculation (min) (ACUTE ONLY): 36 min  Past Medical History:  Past Medical History:  Diagnosis Date   Breast cancer ([place])    GERD (gastroesophageal reflux disease)    Hernia    History of breast cancer    left   Hyperlipidemia    Hypertension    Stroke [facility])    Thyroid disease    hypothyroidism   Past Surgical History:  Past Surgical History:  Procedure Laterality Date   BREAST BIOPSY     BREAST LUMPECTOMY  2012   left   ENDARTERECTOMY Right 09/23/2015   Procedure: ENDARTERECTOMY CAROTID WITH DACRON PATCH ANGIOPLASTY;  Surgeon: Mal Misty, MD;  Location: [facility];  Service: Vascular;  Laterality: Right;   HPI:  88 y.o. female with medical history significant of TIA, HTN, HLD  Hx of   breast cancer, anemia, HOH, hypothyroidism, GERD,  Adm with fall and skin tear, elevated blood glucose, UGI 2016 Mild lower esophageal dysmotility. Prominent lower esophageal B ring. No fixed esophageal narrowing or  stricture.13 mm barium tablet became lodged in the oral cavity/upper  oropharynx. Consider direct visualization as clinically warranted  for anatomic narrowing at this location. Small hiatal hernia. 2012 ct chest Bilateral parenchymal scarring breast cancer.    Assessment / Plan / Recommendation  Clinical Impression  Pt currently presents with minimal oral dysphagia c/b prolonged mastication but adequate oral clearance eventually. She was observed with her lunch including fruit, mac-n-cheese, brocolli, fish and tea.  No indication of aspiraiton or airway compromise. She is able to self feed and compensate by clearing oral cavity fully with time.  Pt denies dysphagia - noted to have occasional eructation with intake - and has h/o known esophageal dysmotility. Given her  awareness of oral deficits and adequate independent clearance, recommend continue diet with precautions.  Advised RN to findings of prior UGI study from 2016. No SLP follow up needed. SLP Visit Diagnosis: Dysphagia, oral phase (R13.11)    Aspiration Risk  Mild aspiration risk    Diet Recommendation Regular;Thin liquid   Liquid Administration via: Cup;Straw Medication Administration: Whole meds with liquid Supervision: Patient able to self feed Compensations: Slow rate;Small sips/bites Postural Changes: Seated upright at 90 degrees;Remain upright for at least 30 minutes after po intake    Other  Recommendations Oral Care Recommendations: Oral care BID    Recommendations for follow up therapy are one component of a multi-disciplinary discharge planning process, led by the attending physician.  Recommendations may be updated based on patient status, additional functional criteria and insurance authorization.  Follow up Recommendations No SLP follow up      Assistance Recommended at Discharge None  Functional Status Assessment Patient has not had a recent decline in their functional status  Frequency and Duration            Prognosis        Swallow Study   General Date of Onset: 11/24/21 HPI: 88 y.o. female with medical history significant of TIA, HTN, HLD  Hx of   breast cancer, anemia, HOH, hypothyroidism, GERD,  Adm with fall and skin tear, elevated blood glucose, UGI 2016 Mild lower esophageal dysmotility. Prominent lower esophageal B ring. No fixed esophageal narrowing or  stricture.13 mm barium tablet became lodged in the oral cavity/upper  oropharynx. Consider direct  visualization as clinically warranted  for anatomic narrowing at this location. Small hiatal hernia. 2012 ct chest Bilateral parenchymal scarring breast cancer. Type of Study: Bedside Swallow Evaluation Previous Swallow Assessment: See HPI Diet Prior to this Study: Regular;Thin liquids Temperature Spikes Noted:  No Respiratory Status: Room air History of Recent Intubation: No Behavior/Cognition: Alert;Cooperative;Pleasant mood Oral Cavity Assessment: Within Functional Limits Oral Care Completed by SLP: No (meal present) Oral Cavity - Dentition: Adequate natural dentition Self-Feeding Abilities: Able to feed self;Needs set up Patient Positioning: Upright in bed Baseline Vocal Quality: Normal Volitional Cough: Cognitively unable to elicit Volitional Swallow: Unable to elicit    Oral/Motor/Sensory Function Overall Oral Motor/Sensory Function: Within functional limits   Ice Chips Ice chips: Not tested   Thin Liquid Thin Liquid: Within functional limits Presentation: Self Fed;Straw    Nectar Thick Nectar Thick Liquid: Not tested   Honey Thick Honey Thick Liquid: Not tested   Puree Puree: Within functional limits Presentation: Spoon;Self Fed   Solid     Solid: Impaired Presentation: Self Fed Oral Phase Impairments: Reduced lingual movement/coordination;Impaired mastication Oral Phase Functional Implications: Prolonged oral transit Other Comments: oral pocketing on right -which pt is clearing independently      Macario Golds 11/24/2021,12:08 PM  Kathleen Lime, MS [place] Office [phone] Pager [phone]

## 2021-11-24 NOTE — Assessment & Plan Note (Signed)
-   Remaining normotensive - Continue amlodipine

## 2021-11-24 NOTE — Progress Notes (Signed)
Echocardiogram 2D Echocardiogram has been performed.  Oneal Deputy Marck Mcclenny RDCS 11/24/2021, 3:30 PM

## 2021-11-24 NOTE — Progress Notes (Signed)
MD updated that Patient has been assisted up twice this shift to [facility] and Patient unable to void. Patient with bladder scan done with results of 600 ml. Per MD new order Foley Catheter inserted by 2 RNs with immeidate return of 600 dark tea colored urine. Patient tolerated procedure well.

## 2021-11-24 NOTE — Assessment & Plan Note (Signed)
-   Negative for fracture on imaging work-up on admission - Continue supportive care

## 2021-11-24 NOTE — TOC Initial Note (Signed)
Transition of Care [facility]) - Initial/Assessment Note    Patient Details  Name: Tabitha Harris MRN: [id number] Date of Birth: Mar 29, 1920  [facility]) CM/SW Contact:    Vassie Moselle, LCSW Phone Number: 11/24/2021, 1:36 PM  Clinical Narrative:                 Met with pt who was alert and oriented x 4. Pt has difficulty hearing and CSW had to speak loudly and allow pt to read lips during conversation. Pt declines having family involved and states they all live far away. May need to re-discuss family involvement at a later time as pt has been living alone and was found on the floor after neighbor noticed 3-days of newspapers piled up. Pt is agreeable to SNF placement and states she has not been to SNF in the past. Pt has been faxed out for placement and currently awaiting bed offers.   Expected Discharge Plan: Skilled Nursing Facility Barriers to Discharge: Continued Medical Work up   Patient Goals and CMS Choice Patient states their goals for this hospitalization and ongoing recovery are:: "To go home"   Choice offered to / list presented to : Patient  Expected Discharge Plan and Services Expected Discharge Plan: [place] In-house Referral: Clinical Social Work Discharge Planning Services: CM Consult Post Acute Care Choice: [place] Living arrangements for the past 2 months: Single Family Home                 DME Arranged: N/A DME Agency: NA                  Prior Living Arrangements/Services Living arrangements for the past 2 months: [place] Lives with:: Self Patient language and need for interpreter reviewed:: Yes Do you feel safe going back to the place where you live?: Yes      Need for Family Participation in Patient Care: Yes (Comment) Care giver support system in place?: No (comment) Current home services: DME Criminal Activity/Legal Involvement Pertinent to Current Situation/Hospitalization: No - Comment  as needed  Activities of Daily Living Home Assistive Devices/Equipment: Eyeglasses, Gilford Rile (specify type) ADL Screening (condition at time of admission) Patient's cognitive ability adequate to safely complete daily activities?: Yes Is the patient deaf or have difficulty hearing?: Yes Does the patient have difficulty seeing, even when wearing glasses/contacts?: No Does the patient have difficulty concentrating, remembering, or making decisions?: No Patient able to express need for assistance with ADLs?: Yes Does the patient have difficulty dressing or bathing?: No Independently performs ADLs?: Yes (appropriate for developmental age) Does the patient have difficulty walking or climbing stairs?: Yes Weakness of Legs: Both Weakness of Arms/Hands: Both  Permission Sought/Granted Permission sought to share information with : Facility Art therapist granted to share information with : No              Emotional Assessment Appearance:: Appears stated age Attitude/Demeanor/Rapport: Gracious Affect (typically observed): Accepting Orientation: : Oriented to Self, Oriented to Place, Oriented to  Time, Oriented to Situation Alcohol / Substance Use: Not Applicable Psych Involvement: No (comment)  Admission diagnosis:  AKI (acute kidney injury) ([place]) [N17.9] Patient Active Problem List   Diagnosis Date Noted   Pressure injury of skin 11/24/2021   Hypothyroidism 11/24/2021   Rhabdomyolysis 11/24/2021   Acute urinary retention 11/24/2021   AKI (acute kidney injury) ([place]) 11/23/2021   Fall at home, initial encounter 11/23/2021   Hip pain 11/23/2021  Chronic diastolic CHF (congestive heart failure) ([place]) 11/23/2021   Leukocytosis 11/23/2021   Macrocytic anemia 08/31/2020   Amaurosis fugax, right eye 10/04/2015   Postoperative anemia due to acute blood loss    Vocal cord paralysis, unilateral complete    Carotid stenosis    Preoperative cardiovascular examination     HLD (hyperlipidemia)    Essential hypertension    Esophageal reflux    History of TIA (transient ischemic attack) 09/20/2015   Stroke-like symptom 09/20/2015   hx: breast cancer, left UOQ, invasive lobular, receptor + her 2 - 05/25/2011   PCP:  Merrilee Seashore, MD Pharmacy:   [id number] - [place], [place]. AT [address]. [place] [address] Phone: [phone] Fax: [phone]  [facility] [address], [place] - [place] [address] [place] [place] [address] Phone: [phone] Fax: [phone]  [facility] [place], [place] [address] [place] [address] Phone: [phone] Fax: [phone]     Social Determinants of Health (SDOH) Interventions    Readmission Risk Interventions     No data to display

## 2021-11-24 NOTE — Assessment & Plan Note (Addendum)
-   FeNa 0.3% consistent with pre-renal due to volume depletion in setting of decreased intake from being found down - baseline creatinine ~ 0.8 - patient presents with increase in creat >0.3 mg/dL above baseline, creat increase >1.5x baseline presumed to have occurred within past 7 days PTA - creat 3.66 on admission -Creatinine improving with fluids -Creatinine 1.17 at time of discharge - Continue encouraging good oral intake

## 2021-11-25 ENCOUNTER — Inpatient Hospital Stay (HOSPITAL_COMMUNITY): Payer: Medicare Other

## 2021-11-25 DIAGNOSIS — R338 Other retention of urine: Secondary | ICD-10-CM | POA: Diagnosis not present

## 2021-11-25 DIAGNOSIS — I350 Nonrheumatic aortic (valve) stenosis: Secondary | ICD-10-CM

## 2021-11-25 DIAGNOSIS — N179 Acute kidney failure, unspecified: Secondary | ICD-10-CM | POA: Diagnosis not present

## 2021-11-25 LAB — BASIC METABOLIC PANEL
Anion gap: 8 (ref 5–15)
BUN: 67 mg/dL — ABNORMAL HIGH (ref 8–23)
CO2: 26 mmol/L (ref 22–32)
Calcium: 7.8 mg/dL — ABNORMAL LOW (ref 8.9–10.3)
Chloride: 106 mmol/L (ref 98–111)
Creatinine, Ser: 2.05 mg/dL — ABNORMAL HIGH (ref 0.44–1.00)
GFR, Estimated: 21 mL/min — ABNORMAL LOW (ref >60)
Glucose, Bld: 83 mg/dL (ref 70–99)
Potassium: 2.9 mmol/L — ABNORMAL LOW (ref 3.5–5.1)
Sodium: 140 mmol/L (ref 135–145)

## 2021-11-25 LAB — CBC WITH DIFFERENTIAL/PLATELET
Abs Immature Granulocytes: 0.06 10*3/uL (ref 0.00–0.07)
Basophils Absolute: 0 10*3/uL (ref 0.0–0.1)
Basophils Relative: 0 %
Eosinophils Absolute: 0 10*3/uL (ref 0.0–0.5)
Eosinophils Relative: 0 %
HCT: 26 % — ABNORMAL LOW (ref 36.0–46.0)
Hemoglobin: 8.8 g/dL — ABNORMAL LOW (ref 12.0–15.0)
Immature Granulocytes: 1 %
Lymphocytes Relative: 10 %
Lymphs Abs: 1 10*3/uL (ref 0.7–4.0)
MCH: 34.4 pg — ABNORMAL HIGH (ref 26.0–34.0)
MCHC: 33.8 g/dL (ref 30.0–36.0)
MCV: 101.6 fL — ABNORMAL HIGH (ref 80.0–100.0)
Monocytes Absolute: 0.7 10*3/uL (ref 0.1–1.0)
Monocytes Relative: 7 %
Neutro Abs: 7.9 10*3/uL — ABNORMAL HIGH (ref 1.7–7.7)
Neutrophils Relative %: 82 %
Platelets: 129 10*3/uL — ABNORMAL LOW (ref 150–400)
RBC: 2.56 MIL/uL — ABNORMAL LOW (ref 3.87–5.11)
RDW: 19.2 % — ABNORMAL HIGH (ref 11.5–15.5)
WBC: 9.6 10*3/uL (ref 4.0–10.5)
nRBC: 0.9 % — ABNORMAL HIGH (ref 0.0–0.2)

## 2021-11-25 LAB — T3: T3, Total: <20 ng/dL — ABNORMAL LOW (ref 71–180)

## 2021-11-25 LAB — CK: Total CK: 175 U/L (ref 38–234)

## 2021-11-25 LAB — MAGNESIUM: Magnesium: 2.2 mg/dL (ref 1.7–2.4)

## 2021-11-25 MED ORDER — POTASSIUM CHLORIDE 20 MEQ PO PACK
40.0000 meq | PACK | ORAL | Status: DC
  Administered 2021-11-25 (×2): 40 meq via ORAL
  Filled 2021-11-25 (×2): qty 2

## 2021-11-25 MED ORDER — CHLORHEXIDINE GLUCONATE CLOTH 2 % EX PADS
6.0000 | MEDICATED_PAD | Freq: Every day | CUTANEOUS | Status: DC
  Administered 2021-11-25 – 2021-11-28 (×4): 6 via TOPICAL

## 2021-11-25 MED ORDER — POTASSIUM CHLORIDE CRYS ER 20 MEQ PO TBCR
40.0000 meq | EXTENDED_RELEASE_TABLET | Freq: Once | ORAL | Status: AC
  Administered 2021-11-25: 40 meq via ORAL
  Filled 2021-11-25: qty 2

## 2021-11-25 NOTE — Progress Notes (Signed)
Progress Note    Tabitha Harris   WHQ:[id number]  DOB: January 29, 1920  DOA: 11/23/2021     2 PCP: Merrilee Seashore, MD  Initial CC: found down at home  Hospital Course: Ms. Tabitha Harris is a 88 year old female with PMH TIA, HTN, HLD, history of breast cancer, hard of hearing, hypothyroidism, GERD who presented after being found down for an unknown amount of time. Neighbors had noticed that the patient had not picked up her newspaper in a few days and called for a well check. Patient did not remember how she fell or why nor how long she was on the ground. Multiple imaging studies were performed on admission. CXR negative for acute abnormalities.  X-ray pelvis also negative for acute fractures.  CT head negative for acute findings and shows underlying cerebral atrophy.  CT C-spine negative for acute fractures and shows underlying chronic degenerative changes. Renal function was worse beyond baseline compared to prior, creatinine 3.66 on admission.  CK was mildly elevated, 456.  Troponins were also indeterminately elevated and down trended after being started on fluids.  EKG was negative for ischemic changes and patient had no complaints of chest pain.  Interval History:  No events overnight.  Resting in bed comfortably when seen this morning.  She had no specific concerns when asked.  Assessment and Plan: * AKI (acute kidney injury) ([place]) - FeNa 0.3% consistent with pre-renal due to volume depletion in setting of decreased intake from being found down - baseline creatinine ~ 0.8 - patient presents with increase in creat >0.3 mg/dL above baseline, creat increase >1.5x baseline presumed to have occurred within past 7 days PTA - creat 3.66 on admission - continue IVF and diet - trend BMP  Acute urinary retention - Patient required straight cath after admission due to retention and continued to have ongoing retention with inability to void -Given need for ongoing IV fluids and difficulty with  retention, prudent to place catheter for now until renal function normalized then pursue a trial of void  Fall at home, initial encounter - Unclear etiology and patient cannot remember falling or events leading up to it - Syncope work-up initiated on admission; follow-up echo - Patient lives alone with no family in the area - s/p PT/OT evals; plan is for SNF at discharge   Rhabdomyolysis - mild, due to being down for u/k amount of time - CK 456 on admission - continue trending 1 more day and continue IVF   Hypothyroidism - TSH 7.583.  Suspect some unintentional missed doses given advanced age and forgetfulness - Free T4 lower limit of normal, 0.67 - Will clarify Synthroid dose and resume  Leukocytosis - Suspected multifactorial in setting of hemoconcentration and reactive.  Low suspicion for infection at this time - Continue fluids and trending  Aortic stenosis - noted on echo for syncope workup; degree of AS unable to be measured - given advanced age and frailty, suspect she would not be a candidate for aggressive interventions - BP and HR stable - continue conservative measures for now   Pressure injury of skin - continue wound care rec's  Chronic diastolic CHF (congestive heart failure) ([facility]) - No signs of exacerbation.  Patient appears hypovolemic especially in setting of AKI -Echo notes EF 60 to 65% with grade 2 diastolic dysfunction  Hip pain - Negative for fracture on imaging work-up on admission - Continue supportive care  Macrocytic anemia - Folate and B12 normal  Essential hypertension - Remaining normotensive - Continue amlodipine  HLD (hyperlipidemia) - Likely no further mortality benefit for statin.  Okay to discontinue at this time  History of TIA (transient ischemic attack) - Continue aspirin  Elevated troponin-resolved as of 11/24/2021 - No ischemic changes on EKG and patient chest pain-free.  Some elevation attributed to renal dysfunction -Trops  have downtrended, no further work-up or trending necessary   Old records reviewed in assessment of this patient  Antimicrobials:   DVT prophylaxis:  SCDs Start: 11/24/21 0041   Code Status:   Code Status: DNR  Mobility Assessment (last 72 hours)     Mobility Assessment     Row Name 11/24/21 2001 11/24/21 1355 11/24/21 1100 11/24/21 0856 11/24/21 0033   Does patient have an order for bedrest or is patient medically unstable No - Continue assessment -- -- No - Continue assessment No - Continue assessment   What is the highest level of mobility based on the progressive mobility assessment? Level 3 (Stands with assist) - Balance while standing  and cannot march in place Level 3 (Stands with assist) - Balance while standing  and cannot march in place Level 3 (Stands with assist) - Balance while standing  and cannot march in place Level 2 (Chairfast) - Balance while sitting on edge of bed and cannot stand Level 2 (Chairfast) - Balance while sitting on edge of bed and cannot stand   Is the above level different from baseline mobility prior to current illness? Yes - Recommend PT order -- -- Yes - Recommend PT order Yes - Recommend PT order            Disposition Plan: SNF Status is: Inpatient  Objective: Blood pressure 133/64, pulse 73, temperature 97.7 F (36.5 C), temperature source Oral, resp. rate 16, height '4\' 11"'$  (1.499 m), weight 37.7 kg, SpO2 99 %.  Examination:  Physical Exam Constitutional:      General: She is not in acute distress.    Appearance: She is not ill-appearing.  HENT:     Head: Normocephalic and atraumatic.     Comments: Small amount of dried blood noted along posterior right scalp    Mouth/Throat:     Mouth: Mucous membranes are moist.  Eyes:     Extraocular Movements: Extraocular movements intact.  Cardiovascular:     Rate and Rhythm: Normal rate and regular rhythm.  Pulmonary:     Effort: Pulmonary effort is normal.     Breath sounds: Normal breath  sounds.  Abdominal:     General: Bowel sounds are normal. There is no distension.     Palpations: Abdomen is soft.     Tenderness: There is no abdominal tenderness.  Musculoskeletal:        General: Normal range of motion.     Cervical back: Normal range of motion and neck supple.  Skin:    General: Skin is warm and dry.  Neurological:     General: No focal deficit present.     Mental Status: She is alert.  Psychiatric:        Mood and Affect: Mood normal.        Behavior: Behavior normal.      Consultants:    Procedures:    Data Reviewed: Results for orders placed or performed during the hospital encounter of 11/23/21 (from the past 24 hour(s))  Basic metabolic panel     Status: Abnormal   Collection Time: 11/25/21  3:23 AM  Result Value Ref Range   Sodium 140 135 - 145 mmol/L  Potassium 2.9 (L) 3.5 - 5.1 mmol/L   Chloride 106 98 - 111 mmol/L   CO2 26 22 - 32 mmol/L   Glucose, Bld 83 70 - 99 mg/dL   BUN 67 (H) 8 - 23 mg/dL   Creatinine, Ser 2.05 (H) 0.44 - 1.00 mg/dL   Calcium 7.8 (L) 8.9 - 10.3 mg/dL   GFR, Estimated 21 (L) >60 mL/min   Anion gap 8 5 - 15  CBC with Differential/Platelet     Status: Abnormal   Collection Time: 11/25/21  3:23 AM  Result Value Ref Range   WBC 9.6 4.0 - 10.5 K/uL   RBC 2.56 (L) 3.87 - 5.11 MIL/uL   Hemoglobin 8.8 (L) 12.0 - 15.0 g/dL   HCT 26.0 (L) 36.0 - 46.0 %   MCV 101.6 (H) 80.0 - 100.0 fL   MCH 34.4 (H) 26.0 - 34.0 pg   MCHC 33.8 30.0 - 36.0 g/dL   RDW 19.2 (H) 11.5 - 15.5 %   Platelets 129 (L) 150 - 400 K/uL   nRBC 0.9 (H) 0.0 - 0.2 %   Neutrophils Relative % 82 %   Neutro Abs 7.9 (H) 1.7 - 7.7 K/uL   Lymphocytes Relative 10 %   Lymphs Abs 1.0 0.7 - 4.0 K/uL   Monocytes Relative 7 %   Monocytes Absolute 0.7 0.1 - 1.0 K/uL   Eosinophils Relative 0 %   Eosinophils Absolute 0.0 0.0 - 0.5 K/uL   Basophils Relative 0 %   Basophils Absolute 0.0 0.0 - 0.1 K/uL   Immature Granulocytes 1 %   Abs Immature Granulocytes 0.06  0.00 - 0.07 K/uL  Magnesium     Status: None   Collection Time: 11/25/21  3:23 AM  Result Value Ref Range   Magnesium 2.2 1.7 - 2.4 mg/dL  CK     Status: None   Collection Time: 11/25/21  3:23 AM  Result Value Ref Range   Total CK 175 38 - 234 U/L    I have Reviewed nursing notes, Vitals, and Lab results since pt's last encounter. Pertinent lab results : see above I have ordered test including BMP, CBC, Mg I have reviewed the last note from staff over past 24 hours I have discussed pt's care plan and test results with nursing staff, case manager   LOS: 2 days   Dwyane Dee, MD [facility] 11/25/2021, 12:24 PM

## 2021-11-25 NOTE — Assessment & Plan Note (Signed)
-   noted on echo for syncope workup; degree of AS unable to be measured - given advanced age and frailty, suspect she would not be a candidate for aggressive interventions - BP and HR stable - continue conservative measures for now

## 2021-11-25 NOTE — Progress Notes (Signed)
   11/25/21 1507  Assess: MEWS Score  Temp 97.8 F (36.6 C)  BP (!) 92/53  Pulse Rate 61  Resp (!) 22  SpO2 96 %  O2 Device Room Air  Assess: MEWS Score  MEWS Temp 0  MEWS Systolic 1  MEWS Pulse 0  MEWS RR 1  MEWS LOC 0  MEWS Score 2  MEWS Score Color Yellow  Assess: if the MEWS score is Yellow or Red  Were vital signs taken at a resting state? Yes  Focused Assessment No change from prior assessment  Does the patient meet 2 or more of the SIRS criteria? No  Treat  Pain Scale 0-10  Pain Score 0  Take Vital Signs  Increase Vital Sign Frequency  Yellow: Q 2hr X 2 then Q 4hr X 2, if remains yellow, continue Q 4hrs  Escalate  MEWS: Escalate Yellow: discuss with charge nurse/RN and consider discussing with provider and RRT  Notify: Charge Nurse/RN  Name of Charge Nurse/RN Notified Abby Dickey RN  Date Charge Nurse/RN Notified 11/25/21  Time Charge Nurse/RN Notified 1536  Assess: SIRS CRITERIA  SIRS Temperature  0  SIRS Pulse 0  SIRS Respirations  1  SIRS WBC 1  SIRS Score Sum  2     2.1 and 2.2 pauses reported from central tele, went to room to assess patient and she was found sleeping.  Patient awakened easily - BP soft, triggering yellow MEWS,  but patient denies any symptoms.  MD made aware - no changes at this time, will continue to monitor.

## 2021-11-26 DIAGNOSIS — N179 Acute kidney failure, unspecified: Secondary | ICD-10-CM | POA: Diagnosis not present

## 2021-11-26 DIAGNOSIS — R338 Other retention of urine: Secondary | ICD-10-CM | POA: Diagnosis not present

## 2021-11-26 LAB — CBC WITH DIFFERENTIAL/PLATELET
Abs Immature Granulocytes: 0.05 10*3/uL (ref 0.00–0.07)
Basophils Absolute: 0 10*3/uL (ref 0.0–0.1)
Basophils Relative: 0 %
Eosinophils Absolute: 0 10*3/uL (ref 0.0–0.5)
Eosinophils Relative: 1 %
HCT: 26 % — ABNORMAL LOW (ref 36.0–46.0)
Hemoglobin: 8.6 g/dL — ABNORMAL LOW (ref 12.0–15.0)
Immature Granulocytes: 1 %
Lymphocytes Relative: 16 %
Lymphs Abs: 1.3 10*3/uL (ref 0.7–4.0)
MCH: 33.9 pg (ref 26.0–34.0)
MCHC: 33.1 g/dL (ref 30.0–36.0)
MCV: 102.4 fL — ABNORMAL HIGH (ref 80.0–100.0)
Monocytes Absolute: 0.7 10*3/uL (ref 0.1–1.0)
Monocytes Relative: 9 %
Neutro Abs: 5.8 10*3/uL (ref 1.7–7.7)
Neutrophils Relative %: 73 %
Platelets: 139 10*3/uL — ABNORMAL LOW (ref 150–400)
RBC: 2.54 MIL/uL — ABNORMAL LOW (ref 3.87–5.11)
RDW: 19.9 % — ABNORMAL HIGH (ref 11.5–15.5)
WBC: 7.8 10*3/uL (ref 4.0–10.5)
nRBC: 0.9 % — ABNORMAL HIGH (ref 0.0–0.2)

## 2021-11-26 LAB — BASIC METABOLIC PANEL
Anion gap: 4 — ABNORMAL LOW (ref 5–15)
BUN: 46 mg/dL — ABNORMAL HIGH (ref 8–23)
CO2: 24 mmol/L (ref 22–32)
Calcium: 7.5 mg/dL — ABNORMAL LOW (ref 8.9–10.3)
Chloride: 115 mmol/L — ABNORMAL HIGH (ref 98–111)
Creatinine, Ser: 1.43 mg/dL — ABNORMAL HIGH (ref 0.44–1.00)
GFR, Estimated: 33 mL/min — ABNORMAL LOW (ref >60)
Glucose, Bld: 99 mg/dL (ref 70–99)
Potassium: 3.9 mmol/L (ref 3.5–5.1)
Sodium: 143 mmol/L (ref 135–145)

## 2021-11-26 LAB — MAGNESIUM: Magnesium: 1.8 mg/dL (ref 1.7–2.4)

## 2021-11-26 NOTE — Plan of Care (Signed)
Patient up to chair with 2 assist, sat in chair for majority of shift.  Patient with good appetite, eating 50% or more of each meal in addition to drinking Ensures.  Daughter and niece in to visit.

## 2021-11-26 NOTE — Progress Notes (Signed)
Progress Note    Tabitha Harris   ZOX:[id number]  DOB: 1919/05/03  DOA: 11/23/2021     3 PCP: Tabitha Seashore, MD  Initial CC: found down at home  Hospital Course: [name] is a 88 year old female with PMH TIA, HTN, HLD, history of breast cancer, hard of hearing, hypothyroidism, GERD who presented after being found down for an unknown amount of time. Neighbors had noticed that the patient had not picked up her newspaper in a few days and called for a well check. Patient did not remember how she fell or why nor how long she was on the ground. Multiple imaging studies were performed on admission. CXR negative for acute abnormalities.  X-ray pelvis also negative for acute fractures.  CT head negative for acute findings and shows underlying cerebral atrophy.  CT C-spine negative for acute fractures and shows underlying chronic degenerative changes. Renal function was worse beyond baseline compared to prior, creatinine 3.66 on admission.  CK was mildly elevated, 456.  Troponins were also indeterminately elevated and down trended after being started on fluids.  EKG was negative for ischemic changes and patient had no complaints of chest pain.  Interval History:  No events overnight.  Resting comfortably in recliner when seen this morning.  Had no concerns or complaints.  Appetite remains adequate.  Assessment and Plan: * AKI (acute kidney injury) ([place]) - FeNa 0.3% consistent with pre-renal due to volume depletion in setting of decreased intake from being found down - baseline creatinine ~ 0.8 - patient presents with increase in creat >0.3 mg/dL above baseline, creat increase >1.5x baseline presumed to have occurred within past 7 days PTA - creat 3.66 on admission -Creatinine improving with fluids - continue IVF and diet - trend BMP  Acute urinary retention - Patient required straight cath after admission due to retention and continued to have ongoing retention with inability to  void -Given need for ongoing IV fluids and difficulty with retention, prudent to place catheter for now until renal function normalized then pursue a trial of void  Fall at home, initial encounter - Unclear etiology and patient cannot remember falling or events leading up to it - Syncope work-up initiated on admission; follow-up echo - Patient lives alone with no family in the area - s/p PT/OT evals; plan is for SNF at discharge   Hypothyroidism - TSH 7.583.  Suspect some unintentional missed doses given advanced age and forgetfulness - Free T4 lower limit of normal, 0.67. TT3 <20  - continue synthroid - needs repeat TSH in 4-6 weeks  Rhabdomyolysis-resolved as of 11/26/2021 - mild, due to being down for u/k amount of time - CK 456 on admission -CK normalized after fluids  Leukocytosis-resolved as of 11/26/2021 - Suspected multifactorial in setting of hemoconcentration and reactive.  Low suspicion for infection at this time  Aortic stenosis - noted on echo for syncope workup; degree of AS unable to be measured - given advanced age and frailty, suspect she would not be a candidate for aggressive interventions - BP and HR stable - continue conservative measures for now   Pressure injury of skin - continue wound care rec's  Chronic diastolic CHF (congestive heart failure) (HCC) - No signs of exacerbation.  Patient appears hypovolemic especially in setting of AKI -Echo notes EF 60 to 65% with grade 2 diastolic dysfunction  Hip pain - Negative for fracture on imaging work-up on admission - Continue supportive care  Macrocytic anemia - Folate and B12 normal  Essential hypertension -  Remaining normotensive - Continue amlodipine  HLD (hyperlipidemia) - Likely no further mortality benefit for statin.  Okay to discontinue at this time  History of TIA (transient ischemic attack) - Continue aspirin  Elevated troponin-resolved as of 11/24/2021 - No ischemic changes on EKG and  patient chest pain-free.  Some elevation attributed to renal dysfunction -Trops have downtrended, no further work-up or trending necessary   Old records reviewed in assessment of this patient  Antimicrobials:   DVT prophylaxis:  SCDs Start: 11/24/21 0041   Code Status:   Code Status: DNR  Mobility Assessment (last 72 hours)     Mobility Assessment     Row Name 11/26/21 0744 11/25/21 1920 11/25/21 1022 11/24/21 2001 11/24/21 1355   Does patient have an order for bedrest or is patient medically unstable No - Continue assessment No - Continue assessment No - Continue assessment No - Continue assessment --   What is the highest level of mobility based on the progressive mobility assessment? Level 3 (Stands with assist) - Balance while standing  and cannot march in place Level 3 (Stands with assist) - Balance while standing  and cannot march in place Level 3 (Stands with assist) - Balance while standing  and cannot march in place Level 3 (Stands with assist) - Balance while standing  and cannot march in place Level 3 (Stands with assist) - Balance while standing  and cannot march in place   Is the above level different from baseline mobility prior to current illness? Yes - Recommend PT order Yes - Recommend PT order Yes - Recommend PT order Yes - Recommend PT order --    [place] Name 11/24/21 1100 11/24/21 0856 11/24/21 0033       Does patient have an order for bedrest or is patient medically unstable -- No - Continue assessment No - Continue assessment     What is the highest level of mobility based on the progressive mobility assessment? Level 3 (Stands with assist) - Balance while standing  and cannot march in place Level 2 (Chair[place]) - Balance while sitting on edge of bed and cannot stand Level 2 (Chairfast) - Balance while sitting on edge of bed and cannot stand     Is the above level different from baseline mobility prior to current illness? -- Yes - Recommend PT order Yes - Recommend PT  order              Disposition Plan: SNF Status is: Inpatient  Objective: Blood pressure (!) 128/58, pulse 65, temperature 98.2 F (36.8 C), temperature source Oral, resp. rate (!) 24, height '4\' 11"'$  (1.499 m), weight 37.7 kg, SpO2 97 %.  Examination:  Physical Exam Constitutional:      General: She is not in acute distress.    Appearance: She is not ill-appearing.  HENT:     Head: Normocephalic and atraumatic.     Comments: Small amount of dried blood noted along posterior right scalp    Mouth/Throat:     Mouth: Mucous membranes are moist.  Eyes:     Extraocular Movements: Extraocular movements intact.  Cardiovascular:     Rate and Rhythm: Normal rate and regular rhythm.  Pulmonary:     Effort: Pulmonary effort is normal.     Breath sounds: Normal breath sounds.  Abdominal:     General: Bowel sounds are normal. There is no distension.     Palpations: Abdomen is soft.     Tenderness: There is no abdominal tenderness.  Musculoskeletal:  General: Normal range of motion.     Cervical back: Normal range of motion and neck supple.  Skin:    General: Skin is warm and dry.  Neurological:     General: No focal deficit present.     Mental Status: She is alert.  Psychiatric:        Mood and Affect: Mood normal.        Behavior: Behavior normal.      Consultants:    Procedures:    Data Reviewed: Results for orders placed or performed during the hospital encounter of 11/23/21 (from the past 24 hour(s))  Basic metabolic panel     Status: Abnormal   Collection Time: 11/26/21  3:42 AM  Result Value Ref Range   Sodium 143 135 - 145 mmol/L   Potassium 3.9 3.5 - 5.1 mmol/L   Chloride 115 (H) 98 - 111 mmol/L   CO2 24 22 - 32 mmol/L   Glucose, Bld 99 70 - 99 mg/dL   BUN 46 (H) 8 - 23 mg/dL   Creatinine, Ser 1.43 (H) 0.44 - 1.00 mg/dL   Calcium 7.5 (L) 8.9 - 10.3 mg/dL   GFR, Estimated 33 (L) >60 mL/min   Anion gap 4 (L) 5 - 15  CBC with Differential/Platelet      Status: Abnormal   Collection Time: 11/26/21  3:42 AM  Result Value Ref Range   WBC 7.8 4.0 - 10.5 K/uL   RBC 2.54 (L) 3.87 - 5.11 MIL/uL   Hemoglobin 8.6 (L) 12.0 - 15.0 g/dL   HCT 26.0 (L) 36.0 - 46.0 %   MCV 102.4 (H) 80.0 - 100.0 fL   MCH 33.9 26.0 - 34.0 pg   MCHC 33.1 30.0 - 36.0 g/dL   RDW 19.9 (H) 11.5 - 15.5 %   Platelets 139 (L) 150 - 400 K/uL   nRBC 0.9 (H) 0.0 - 0.2 %   Neutrophils Relative % 73 %   Neutro Abs 5.8 1.7 - 7.7 K/uL   Lymphocytes Relative 16 %   Lymphs Abs 1.3 0.7 - 4.0 K/uL   Monocytes Relative 9 %   Monocytes Absolute 0.7 0.1 - 1.0 K/uL   Eosinophils Relative 1 %   Eosinophils Absolute 0.0 0.0 - 0.5 K/uL   Basophils Relative 0 %   Basophils Absolute 0.0 0.0 - 0.1 K/uL   Immature Granulocytes 1 %   Abs Immature Granulocytes 0.05 0.00 - 0.07 K/uL  Magnesium     Status: None   Collection Time: 11/26/21  3:42 AM  Result Value Ref Range   Magnesium 1.8 1.7 - 2.4 mg/dL    I have Reviewed nursing notes, Vitals, and Lab results since pt's last encounter. Pertinent lab results : see above I have ordered test including BMP, CBC, Mg I have reviewed the last note from staff over past 24 hours I have discussed pt's care plan and test results with nursing staff, case manager   LOS: 3 days   Dwyane Dee, MD [facility] 11/26/2021, 1:40 PM

## 2021-11-27 DIAGNOSIS — N179 Acute kidney failure, unspecified: Secondary | ICD-10-CM | POA: Diagnosis not present

## 2021-11-27 DIAGNOSIS — R338 Other retention of urine: Secondary | ICD-10-CM | POA: Diagnosis not present

## 2021-11-27 LAB — CBC WITH DIFFERENTIAL/PLATELET
Abs Immature Granulocytes: 0.04 10*3/uL (ref 0.00–0.07)
Basophils Absolute: 0 10*3/uL (ref 0.0–0.1)
Basophils Relative: 0 %
Eosinophils Absolute: 0.1 10*3/uL (ref 0.0–0.5)
Eosinophils Relative: 2 %
HCT: 25.6 % — ABNORMAL LOW (ref 36.0–46.0)
Hemoglobin: 8.3 g/dL — ABNORMAL LOW (ref 12.0–15.0)
Immature Granulocytes: 1 %
Lymphocytes Relative: 18 %
Lymphs Abs: 1.4 10*3/uL (ref 0.7–4.0)
MCH: 33.5 pg (ref 26.0–34.0)
MCHC: 32.4 g/dL (ref 30.0–36.0)
MCV: 103.2 fL — ABNORMAL HIGH (ref 80.0–100.0)
Monocytes Absolute: 0.7 10*3/uL (ref 0.1–1.0)
Monocytes Relative: 9 %
Neutro Abs: 5.4 10*3/uL (ref 1.7–7.7)
Neutrophils Relative %: 70 %
Platelets: 147 10*3/uL — ABNORMAL LOW (ref 150–400)
RBC: 2.48 MIL/uL — ABNORMAL LOW (ref 3.87–5.11)
RDW: 19.9 % — ABNORMAL HIGH (ref 11.5–15.5)
WBC: 7.7 10*3/uL (ref 4.0–10.5)
nRBC: 0.4 % — ABNORMAL HIGH (ref 0.0–0.2)

## 2021-11-27 LAB — BASIC METABOLIC PANEL
Anion gap: 3 — ABNORMAL LOW (ref 5–15)
BUN: 35 mg/dL — ABNORMAL HIGH (ref 8–23)
CO2: 22 mmol/L (ref 22–32)
Calcium: 7.3 mg/dL — ABNORMAL LOW (ref 8.9–10.3)
Chloride: 116 mmol/L — ABNORMAL HIGH (ref 98–111)
Creatinine, Ser: 1.17 mg/dL — ABNORMAL HIGH (ref 0.44–1.00)
GFR, Estimated: 42 mL/min — ABNORMAL LOW (ref >60)
Glucose, Bld: 97 mg/dL (ref 70–99)
Potassium: 3.6 mmol/L (ref 3.5–5.1)
Sodium: 141 mmol/L (ref 135–145)

## 2021-11-27 LAB — MAGNESIUM: Magnesium: 1.5 mg/dL — ABNORMAL LOW (ref 1.7–2.4)

## 2021-11-27 MED ORDER — POTASSIUM CHLORIDE 20 MEQ PO PACK
40.0000 meq | PACK | Freq: Once | ORAL | Status: AC
Start: 2021-11-27 — End: 2021-11-27
  Administered 2021-11-27: 40 meq via ORAL
  Filled 2021-11-27: qty 2

## 2021-11-27 MED ORDER — MAGNESIUM SULFATE 2 GM/50ML IV SOLN
2.0000 g | Freq: Once | INTRAVENOUS | Status: AC
  Administered 2021-11-27: 2 g via INTRAVENOUS
  Filled 2021-11-27: qty 50

## 2021-11-27 MED ORDER — ORAL CARE MOUTH RINSE: 15.0000 mL | OROMUCOSAL | Status: DC | PRN

## 2021-11-27 NOTE — Progress Notes (Signed)
Attempted to call report to receiving facility. Shiela answered stating nurse should be Nunzio Cory and she would return call at a later time as she was busy with another pt.

## 2021-11-27 NOTE — Progress Notes (Signed)
Physical Therapy Treatment Patient Details Name: Tabitha Harris MRN: [id number] DOB: [birth year] Today's Date: 11/27/2021   History of Present Illness Pt admitted from home s/p fall and on floor for undetermined amount of time.  Pt wtih hs ov CHF, reast CA, and CVA (no residual affects)    PT Comments    Patient remains limited by weakness and poor endurance and remains fearful of falling. Patient continues to require 2+ assist for bed mobility and transfers with RW. Pt attempted short bout of gait from recliner but is limited by poor posture with significant trunk flexion leading to low hip flexion and crouched gait in which pt fatigues rapidly. She will benefit from continued skilled PT in SNF setting, will progress as able.    Recommendations for follow up therapy are one component of a multi-disciplinary discharge planning process, led by the attending physician.  Recommendations may be updated based on patient status, additional functional criteria and insurance authorization.  Follow Up Recommendations  Skilled nursing-short term rehab (<3 hours/day) Can patient physically be transported by private vehicle: Yes   Assistance Recommended at Discharge Frequent or constant Supervision/Assistance  Patient can return home with the following A lot of help with walking and/or transfers;A little help with bathing/dressing/bathroom;Assistance with cooking/housework;Assist for transportation;Help with stairs or ramp for entrance   Equipment Recommendations  Rolling walker (2 wheels) (youth level rolling walker)    Recommendations for Other Services       Precautions / Restrictions Precautions Precautions: Fall Restrictions Weight Bearing Restrictions: No     Mobility  Bed Mobility Overal bed mobility: Needs Assistance Bed Mobility: Supine to Sit     Supine to sit: Mod assist, +2 for physical assistance, +2 for safety/equipment     General bed mobility comments: cues to initiate  bringing LE's off EOB, pt taking extra time. Assist needed to bring hands to bed rail. Mod +2 to fully pivot/roll and raise trunk upright.    Transfers Overall transfer level: Needs assistance Equipment used: Rolling walker (2 wheels) Transfers: Sit to/from Stand, Bed to chair/wheelchair/BSC Sit to Stand: Min assist, +2 safety/equipment   Step pivot transfers: +2 safety/equipment, Mod assist       General transfer comment: assist to scoot to EOB with bed pad and cues to bring hands up to RW. Min assist +2 to rise from EOB, pt flexed in standing. Mod +2 to guide RW towards recliner for transfer.    Ambulation/Gait Ambulation/Gait assistance: Min assist, +2 physical assistance, +2 safety/equipment Gait Distance (Feet): 4 Feet Assistive device: Rolling walker (2 wheels) Gait Pattern/deviations: Step-to pattern, Decreased step length - right, Decreased step length - left, Shuffle, Trunk flexed Gait velocity: decr     General Gait Details: pt took small forward steps from recliner, trunk significantly flexed and low hip flexion/foot clearance with stepping. pt starting to sit without requesting chair (chair right behind pt)   Stairs             Wheelchair Mobility    Modified Rankin (Stroke Patients Only)       Balance Overall balance assessment: Needs assistance Sitting-balance support: Feet unsupported, No upper extremity supported Sitting balance-Leahy Scale: Fair     Standing balance support: Bilateral upper extremity supported Standing balance-Leahy Scale: Poor                              Cognition Arousal/Alertness: Awake/alert Behavior During Therapy: Anxious, Flat affect Overall Cognitive Status:  Difficult to assess                                          Exercises      General Comments        Pertinent Vitals/Pain Pain Assessment Pain Assessment: Faces Faces Pain Scale: Hurts little more Pain Location: IV site on  Lt arm Pain Descriptors / Indicators: Discomfort Pain Intervention(s): Limited activity within patient's tolerance, Monitored during session, Repositioned    Home Living                          Prior Function            PT Goals (current goals can now be found in the care plan section) Acute Rehab PT Goals Patient Stated Goal: Get warm PT Goal Formulation: With patient Time For Goal Achievement: 12/08/21 Potential to Achieve Goals: Fair Progress towards PT goals: Progressing toward goals    Frequency    Min 3X/week      PT Plan Current plan remains appropriate    Co-evaluation              AM-PAC PT "6 Clicks" Mobility   Outcome Measure  Help needed turning from your back to your side while in a flat bed without using bedrails?: A Little Help needed moving from lying on your back to sitting on the side of a flat bed without using bedrails?: A Lot Help needed moving to and from a bed to a chair (including a wheelchair)?: A Lot Help needed standing up from a chair using your arms (e.g., wheelchair or bedside chair)?: A Little Help needed to walk in hospital room?: A Lot Help needed climbing 3-5 steps with a railing? : Total 6 Click Score: 13    End of Session Equipment Utilized During Treatment: Gait belt Activity Tolerance: Patient tolerated treatment well;Patient limited by fatigue;Other (comment) (anxiety, fear of falling) Patient left: with call bell/phone within reach;in chair;with chair alarm set Nurse Communication: Mobility status PT Visit Diagnosis: Unsteadiness on feet (R26.81);Muscle weakness (generalized) (M62.81);Difficulty in walking, not elsewhere classified (R26.2);History of falling (Z91.81)     Time: 9379-0240 PT Time Calculation (min) (ACUTE ONLY): 33 min  Charges:  $Therapeutic Activity: 23-37 mins                     Verner Mould, DPT Acute Rehabilitation Services Office [phone] Pager [phone]  11/27/21 11:11  AM

## 2021-11-27 NOTE — TOC Progression Note (Addendum)
Transition of Care [facility]) - Progression Note    Patient Details  Name: Tabitha Harris MRN: [id number] Date of Birth: 11/01/1919  [facility] CM/SW Contact  Leeroy Cha, RN Phone Number: 11/27/2021, 11:07 AM  Clinical Narrative:    Patient does not have a preference as to which snf she will go to.  Does not know any that have made bed offers.  Lives the nearest to [facility] will see if they have a bed open. Information sent to [facility] for possible transfer and admission. [facility] has a bed today after 1330.  Will send dc summary via the hub when in the system. Patient alerted has to dc and where. IS AGREEABLE TO GO. [facility]- made aware of transfer to [facility] today.  Number of [facility] and contact left on voice mail. Expected Discharge Plan: Tabitha Barriers to Discharge: Continued Medical Work up  Expected Discharge Plan and Services Expected Discharge Plan: [place] In-house Referral: Clinical Social Work Discharge Planning Services: CM Consult Post Acute Care Choice: [place] Living arrangements for the past 2 months: Single Family Home                 DME Arranged: N/A DME Agency: NA                   Social Determinants of Health (SDOH) Interventions    Readmission Risk Interventions     No data to display

## 2021-11-27 NOTE — Discharge Summary (Addendum)
Physician Discharge Summary   Tabitha Harris KGM:[id number] DOB: Nov 03, 1919 DOA: 11/23/2021  PCP: Tabitha Seashore, MD  Admit date: 11/23/2021 Discharge date:  11/27/2021 Barriers to discharge: none  Admitted From: Home Disposition:  [name] Discharging physician: Tabitha Dee, MD  Recommendations for Outpatient Follow-up:  Remove foley in 1-2 days and perform trial of void Repeat TSH in 4 to 6 weeks  Home Health:  Equipment/Devices:   Discharge Condition: stable CODE STATUS: DNR Diet recommendation:  Diet Orders (From admission, onward)     Start     Ordered   11/27/21 0000  Diet general        11/27/21 1200   11/24/21 1538  Diet regular Room service appropriate? Yes; Fluid consistency: Thin  Diet effective now       Question Answer Comment  Room service appropriate? Yes   Fluid consistency: Thin      11/24/21 1537            Hospital Course: Tabitha Harris is a 88 year old female with PMH TIA, HTN, HLD, history of breast cancer, hard of hearing, hypothyroidism, GERD who presented after being found down for an unknown amount of time. Neighbors had noticed that the patient had not picked up her newspaper in a few days and called for a well check. Patient did not remember how she fell or why nor how long she was on the ground. Multiple imaging studies were performed on admission. CXR negative for acute abnormalities.  X-ray pelvis also negative for acute fractures.  CT head negative for acute findings and shows underlying cerebral atrophy.  CT C-spine negative for acute fractures and shows underlying chronic degenerative changes. Renal function was worse beyond baseline compared to prior, creatinine 3.66 on admission.  CK was mildly elevated, 456.  Troponins were also indeterminately elevated and down trended after being started on fluids.  EKG was negative for ischemic changes and patient had no complaints of chest pain.  Assessment and Plan: * AKI (acute kidney  injury) (HCC)-resolved as of 11/27/2021 - FeNa 0.3% consistent with pre-renal due to volume depletion in setting of decreased intake from being found down - baseline creatinine ~ 0.8 - patient presents with increase in creat >0.3 mg/dL above baseline, creat increase >1.5x baseline presumed to have occurred within past 7 days PTA - creat 3.66 on admission -Creatinine improving with fluids -Creatinine 1.17 at time of discharge - Continue encouraging good oral intake  Acute urinary retention - Patient required straight cath after admission due to retention and continued to have ongoing retention with inability to void - strength improved some during hospitalization and renal function normalized - foley continued at discharge until patient more mobile and stronger; would try to remove foley in 1-2 days and perform trial of void  Fall at home, initial encounter - Unclear etiology and patient cannot remember falling or events leading up to it - Syncope work-up initiated on admission - Patient lives alone with no family in the area - s/p PT/OT evals; plan is for SNF at discharge   Hypothyroidism - TSH 7.583.  Suspect some unintentional missed doses given advanced age and forgetfulness - Free T4 lower limit of normal, 0.67. TT3 <20  - continue synthroid - needs repeat TSH in 4-6 weeks  Rhabdomyolysis-resolved as of 11/26/2021 - mild, due to being down for u/k amount of time - CK 456 on admission -CK normalized after fluids  Leukocytosis-resolved as of 11/26/2021 - Suspected multifactorial in setting of hemoconcentration and reactive.  Low suspicion  for infection at this time  Aortic stenosis - noted on echo for syncope workup; degree of AS unable to be measured - given advanced age and frailty, suspect she would not be a candidate for aggressive interventions - BP and HR stable - continue conservative measures for now   Pressure injury of skin - continue wound care rec's  Chronic  diastolic CHF (congestive heart failure) (HCC) - No signs of exacerbation.  Patient appears hypovolemic especially in setting of AKI -Echo notes EF 60 to 65% with grade 2 diastolic dysfunction  Hip pain - Negative for fracture on imaging work-up on admission - Continue supportive care  Macrocytic anemia - Folate and B12 normal  Essential hypertension - Remaining normotensive - Continue amlodipine  HLD (hyperlipidemia) - Likely no further mortality benefit for statin.  Okay to discontinue at this time  History of TIA (transient ischemic attack) - Continue aspirin  Elevated troponin-resolved as of 11/24/2021 - No ischemic changes on EKG and patient chest pain-free.  Some elevation attributed to renal dysfunction -Trops have downtrended, no further work-up or trending necessary    Principal Diagnosis: AKI (acute kidney injury) [facility])  Discharge Diagnoses: Active Hospital Problems   Diagnosis Date Noted   Acute urinary retention 11/24/2021    Priority: 2.   Fall at home, initial encounter 11/23/2021    Priority: 2.   Hypothyroidism 11/24/2021    Priority: 3.   Aortic stenosis 11/25/2021   Pressure injury of skin 11/24/2021   Hip pain 11/23/2021   Chronic diastolic CHF (congestive heart failure) ([place]) 11/23/2021   Macrocytic anemia 08/31/2020   Essential hypertension    HLD (hyperlipidemia)    History of TIA (transient ischemic attack) 09/20/2015   hx: breast cancer, left UOQ, invasive lobular, receptor + her 2 - 05/25/2011    Resolved Hospital Problems   Diagnosis Date Noted Date Resolved   AKI (acute kidney injury) ([place]) 11/23/2021 11/27/2021    Priority: 1.   Rhabdomyolysis 11/24/2021 11/26/2021    Priority: 3.   Leukocytosis 11/23/2021 11/26/2021    Priority: 4.   Elevated troponin 11/23/2021 11/24/2021     Discharge Instructions     Diet general   Complete by: As directed    Discharge wound care:   Complete by: As directed    Apply foam dressings to the  sacrum and right trochanter.  Change every 3 days or as needed for soilage. Gently cleanse abrasions of the following areas with saline, pat dry, then apply Vaseline gauze or Xeroform gauze over the abrasions, topped with foam dressings: RLE, LLE (along the shins), LUE   Increase activity slowly   Complete by: As directed       Allergies as of 11/27/2021       Reactions   Codeine Other (See Comments)   Unknown per pt        Medication List     STOP taking these medications    atorvastatin 20 MG tablet Commonly known as: LIPITOR   calcium carbonate 600 MG Tabs tablet Commonly known as: OS-CAL   fish oil-omega-3 fatty acids 1000 MG capsule   phenol 1.4 % Liqd Commonly known as: CHLORASEPTIC       TAKE these medications    acetaminophen 500 MG tablet Commonly known as: TYLENOL Take 2 tablets (1,000 mg total) by mouth every 8 (eight) hours as needed for fever or headache (pain).   amLODipine 5 MG tablet Commonly known as: NORVASC Take 5 mg by mouth daily.   aspirin  81 MG tablet Take 81 mg by mouth daily.   bimatoprost 0.01 % Soln Commonly known as: LUMIGAN 1 drop at bedtime.   dorzolamide-timolol 22.3-6.8 MG/ML ophthalmic solution Commonly known as: COSOPT   esomeprazole 40 MG capsule Commonly known as: NEXIUM Take 40 mg by mouth daily as needed.   levothyroxine 50 MCG tablet Commonly known as: SYNTHROID Take 50 mcg by mouth daily. What changed: Another medication with the same name was removed. Continue taking this medication, and follow the directions you see here.   PRESERVISION AREDS 2+MULTI VIT PO Take by mouth.               Discharge Care Instructions  (From admission, onward)           Start     Ordered   11/27/21 0000  Discharge wound care:       Comments: Apply foam dressings to the sacrum and right trochanter.  Change every 3 days or as needed for soilage. Gently cleanse abrasions of the following areas with saline, pat dry,  then apply Vaseline gauze or Xeroform gauze over the abrasions, topped with foam dressings: RLE, LLE (along the shins), LUE   11/27/21 1200            Allergies  Allergen Reactions   Codeine Other (See Comments)    Unknown per pt    Consultations:   Procedures:   Discharge Exam: BP 122/64 (BP Location: Right Arm)   Pulse 75   Temp 99.1 F (37.3 C) (Oral)   Resp (!) 25   Ht '4\' 11"'$  (1.499 m)   Wt 37.7 kg   SpO2 95%   BMI 16.79 kg/m  Physical Exam Constitutional:      General: She is not in acute distress.    Appearance: She is not ill-appearing.  HENT:     Head: Normocephalic and atraumatic.     Comments: Small amount of dried blood noted along posterior right scalp    Mouth/Throat:     Mouth: Mucous membranes are moist.  Eyes:     Extraocular Movements: Extraocular movements intact.  Cardiovascular:     Rate and Rhythm: Normal rate and regular rhythm.  Pulmonary:     Effort: Pulmonary effort is normal.     Breath sounds: Normal breath sounds.  Abdominal:     General: Bowel sounds are normal. There is no distension.     Palpations: Abdomen is soft.     Tenderness: There is no abdominal tenderness.  Musculoskeletal:        General: Normal range of motion.     Cervical back: Normal range of motion and neck supple.  Skin:    General: Skin is warm and dry.  Neurological:     General: No focal deficit present.     Mental Status: She is alert.  Psychiatric:        Mood and Affect: Mood normal.        Behavior: Behavior normal.      The results of significant diagnostics from this hospitalization (including imaging, microbiology, ancillary and laboratory) are listed below for reference.   Microbiology: No results found for this or any previous visit (from the past 240 hour(s)).   Labs: BNP (last 3 results) No results for input(s): "BNP" in the last 8760 hours. Basic Metabolic Panel: Recent Labs  Lab 11/23/21 2042 11/24/21 0200 11/25/21 0323  11/26/21 0342 11/27/21 0243  NA 143 143 140 143 141  K 4.4 3.9 2.9* 3.9 3.6  CL 105 104 106 115* 116*  CO2 22 21* '26 24 22  '$ GLUCOSE 123* 107* 83 99 97  BUN 91* 84* 67* 46* 35*  CREATININE 3.66* 3.01* 2.05* 1.43* 1.17*  CALCIUM 9.0 8.4* 7.8* 7.5* 7.3*  MG 2.6* 2.3 2.2 1.8 1.5*  PHOS 5.4* 4.6  --   --   --    Liver Function Tests: Recent Labs  Lab 11/23/21 2042 11/24/21 0200  AST 67* 57*  ALT 34 31  ALKPHOS 57 51  BILITOT 2.0* 1.9*  PROT 6.6 5.8*  ALBUMIN 3.7 3.3*   No results for input(s): "LIPASE", "AMYLASE" in the last 168 hours. No results for input(s): "AMMONIA" in the last 168 hours. CBC: Recent Labs  Lab 11/23/21 2042 11/24/21 0200 11/25/21 0323 11/26/21 0342 11/27/21 0243  WBC 17.1* 17.3* 9.6 7.8 7.7  NEUTROABS  --   --  7.9* 5.8 5.4  HGB 12.0 10.6* 8.8* 8.6* 8.3*  HCT 35.8* 31.9* 26.0* 26.0* 25.6*  MCV 101.1* 102.6* 101.6* 102.4* 103.2*  PLT 164 145* 129* 139* 147*   Cardiac Enzymes: Recent Labs  Lab 11/23/21 2042 11/24/21 0036 11/25/21 0323  CKTOTAL 456* 424* 175   BNP: Invalid input(s): "POCBNP" CBG: Recent Labs  Lab 11/23/21 2039  GLUCAP 111*   D-Dimer No results for input(s): "DDIMER" in the last 72 hours. Hgb A1c No results for input(s): "HGBA1C" in the last 72 hours. Lipid Profile No results for input(s): "CHOL", "HDL", "LDLCALC", "TRIG", "CHOLHDL", "LDLDIRECT" in the last 72 hours. Thyroid function studies No results for input(s): "TSH", "T4TOTAL", "T3FREE", "THYROIDAB" in the last 72 hours.  Invalid input(s): "FREET3" Anemia work up No results for input(s): "VITAMINB12", "FOLATE", "FERRITIN", "TIBC", "IRON", "RETICCTPCT" in the last 72 hours. Urinalysis    Component Value Date/Time   COLORURINE AMBER (A) 11/24/2021 0547   APPEARANCEUR CLOUDY (A) 11/24/2021 0547   LABSPEC 1.017 11/24/2021 0547   PHURINE 5.0 11/24/2021 0547   GLUCOSEU 50 (A) 11/24/2021 0547   HGBUR MODERATE (A) 11/24/2021 0547   BILIRUBINUR NEGATIVE  11/24/2021 0547   KETONESUR NEGATIVE 11/24/2021 0547   PROTEINUR 30 (A) 11/24/2021 0547   UROBILINOGEN 0.2 05/09/2010 1205   NITRITE NEGATIVE 11/24/2021 0547   LEUKOCYTESUR LARGE (A) 11/24/2021 0547   Sepsis Labs Recent Labs  Lab 11/24/21 0200 11/25/21 0323 11/26/21 0342 11/27/21 0243  WBC 17.3* 9.6 7.8 7.7   Microbiology No results found for this or any previous visit (from the past 240 hour(s)).  Procedures/Studies: DG Humerus Right  Result Date: 11/25/2021 CLINICAL DATA:  Fall EXAM: RIGHT HUMERUS - 2+ VIEW COMPARISON:  None Available. FINDINGS: No acute bony abnormality. Specifically, no fracture, subluxation, or dislocation. Mild degenerative changes in the right AC joint. IMPRESSION: No acute bony abnormality. Electronically Signed   By: Rolm Baptise M.D.   On: 11/25/2021 17:11   DG Shoulder Right Port  Result Date: 11/25/2021 CLINICAL DATA:  Found down. EXAM: RIGHT SHOULDER - 1 VIEW COMPARISON:  None Available. FINDINGS: Bones are diffusely demineralized. No evidence for an acute fracture. No findings to suggest shoulder dislocation or separation. No worrisome lytic or sclerotic osseous abnormality. IMPRESSION: Negative. Electronically Signed   By: Misty Stanley M.D.   On: 11/25/2021 17:07   DG Forearm Right  Result Date: 11/25/2021 CLINICAL DATA:  Fall EXAM: RIGHT FOREARM - 2 VIEW COMPARISON:  None Available. FINDINGS: No acute bony abnormality. Specifically, no fracture, subluxation, or dislocation. Diffuse vascular calcifications. IMPRESSION: No acute bony abnormality. Electronically Signed   By: Rolm Baptise  M.D.   On: 11/25/2021 17:07   ECHOCARDIOGRAM COMPLETE  Result Date: 11/24/2021    ECHOCARDIOGRAM REPORT   Patient Name:   ERIANA SULIMAN Date of Exam: 11/24/2021 Medical Rec #:  [id number]       Height:       59.0 in Accession #:    [id number]      Weight:       83.1 lb Date of Birth:  28-Feb-1920       BSA:          1.270 m Patient Age:    25 years        BP:            127/96 mmHg Patient Gender: F               HR:           83 bpm. Exam Location:  Inpatient Procedure: 2D Echo, Color Doppler and Cardiac Doppler Indications:    R55 Syncope  History:        Patient has prior history of Echocardiogram examinations, most                 recent 09/21/2015. CHF; Risk Factors:Hypertension and                 Dyslipidemia.  Sonographer:    Raquel Sarna Senior RDCS Referring Phys: [id number] ANASTASSIA DOUTOVA  Sonographer Comments: Technically difficult due to thin body habitus and patient condition. Exam performed as completely as possible with patient guarding chest and stomach with her arms. IMPRESSIONS  1. Left ventricular ejection fraction, by estimation, is 60 to 65%. The left ventricle has normal function. The left ventricle has no regional wall motion abnormalities. There is mild left ventricular hypertrophy. Left ventricular diastolic parameters are consistent with Grade II diastolic dysfunction (pseudonormalization).  2. Peak RV-RA gradient 26 mmHg. Right ventricular systolic function is normal. The right ventricular size is normal.  3. The mitral valve is degenerative. No evidence of mitral valve regurgitation. No evidence of mitral stenosis. Moderate mitral annular calcification.  4. The aortic valve is tricuspid. There is severe calcifcation of the aortic valve. Aortic valve regurgitation is not visualized. Unable to assess for degree of aortic stenosis. There is significant aortic stenosis visually, but due to limitations of this study, unable to measure a gradient across the valve. Would consider TEE or repeat echo when patient can be more cooperative though at 88 years old not sure how vital this would be. FINDINGS  Left Ventricle: Left ventricular ejection fraction, by estimation, is 60 to 65%. The left ventricle has normal function. The left ventricle has no regional wall motion abnormalities. The left ventricular internal cavity size was normal in size. There is  mild left  ventricular hypertrophy. Left ventricular diastolic parameters are consistent with Grade II diastolic dysfunction (pseudonormalization). Right Ventricle: Peak RV-RA gradient 26 mmHg. The right ventricular size is normal. No increase in right ventricular wall thickness. Right ventricular systolic function is normal. Left Atrium: Left atrial size was normal in size. Right Atrium: Right atrial size was normal in size. Pericardium: There is no evidence of pericardial effusion. Mitral Valve: The mitral valve is degenerative in appearance. There is mild calcification of the mitral valve leaflet(s). Moderate mitral annular calcification. No evidence of mitral valve regurgitation. No evidence of mitral valve stenosis. Tricuspid Valve: The tricuspid valve is normal in structure. Tricuspid valve regurgitation is not demonstrated. Aortic Valve: The aortic valve is tricuspid. There is severe calcifcation  of the aortic valve. Aortic valve regurgitation is not visualized. Unable to assess for degree of aortic stenosis. Pulmonic Valve: The pulmonic valve was not well visualized. Pulmonic valve regurgitation is trivial. Aorta: The aortic root is normal in size and structure. Venous: The inferior vena cava was not well visualized. IAS/Shunts: No atrial level shunt detected by color flow Doppler.  LEFT VENTRICLE PLAX 2D LVIDd:         2.60 cm   Diastology LVIDs:         1.80 cm   LV e' medial:    6.64 cm/s LV PW:         0.90 cm   LV E/e' medial:  11.2 LV IVS:        0.90 cm   LV e' lateral:   5.11 cm/s LVOT diam:     2.00 cm   LV E/e' lateral: 14.6 LV SV:         50 LV SV Index:   40 LVOT Area:     3.14 cm  RIGHT VENTRICLE RV S prime:     11.00 cm/s TAPSE (M-mode): 2.4 cm LEFT ATRIUM             Index        RIGHT ATRIUM           Index LA diam:        2.80 cm 2.20 cm/m   RA Area:     10.20 cm LA Vol (A2C):   15.2 ml 11.97 ml/m  RA Volume:   19.10 ml  15.04 ml/m LA Vol (A4C):   16.5 ml 12.99 ml/m LA Biplane Vol: 17.4 ml 13.70  ml/m  AORTIC VALVE LVOT Vmax:   84.85 cm/s LVOT Vmean:  58.100 cm/s LVOT VTI:    0.160 m  AORTA Ao Root diam: 2.60 cm Ao Asc diam:  2.30 cm MITRAL VALVE               TRICUSPID VALVE MV Area (PHT): 2.97 cm    TR Peak grad:   26.2 mmHg MV Decel Time: 255 msec    TR Vmax:        256.00 cm/s MV E velocity: 74.60 cm/s MV A velocity: 69.80 cm/s  SHUNTS MV E/A ratio:  1.07        Systemic VTI:  0.16 m                            Systemic Diam: 2.00 cm Dalton McleanMD Electronically signed by Franki Monte Signature Date/Time: 11/24/2021/4:20:19 PM    Final    CT Thoracic Spine Wo Contrast  Result Date: 11/24/2021 CLINICAL DATA:  Provided history: Back trauma, no prior imaging. Additional history provided: Patient found down. EXAM: CT THORACIC AND LUMBAR SPINE WITHOUT CONTRAST TECHNIQUE: Multidetector CT imaging of the thoracic and lumbar spine was performed without contrast. Multiplanar CT image reconstructions were also generated. RADIATION DOSE REDUCTION: This exam was performed according to the departmental dose-optimization program which includes automated exposure control, adjustment of the mA and/or kV according to patient size and/or use of iterative reconstruction technique. COMPARISON:  Chest CT 07/04/2010. FINDINGS: CT THORACIC SPINE FINDINGS Alignment: Dextrocurvature of the thoracic spine. Exaggerated thoracic kyphosis. No significant spondylolisthesis. Vertebrae: T5 superior endplate compression fracture, new from the prior chest CT of 07/04/2010, but otherwise age-indeterminate (up to 40% height loss). T11 vertebral compression fracture (with mild height loss), new from the prior chest CT of  07/04/2010, but otherwise age-indeterminate. Vertebral body height is otherwise maintained. No evidence of acute fracture to the thoracic spine elsewhere. Paraspinal and other soft tissues: Pleuroparenchymal scarring and calcification within the imaged lung apices, bilaterally. Foci of tree-in-bud nodularity within  the right upper lobe, suspicious for atypical infection. Atherosclerotic plaque within the thoracic and abdominal aorta and proximal major branch vessels of the neck, as well as carotid arteries. No paraspinal mass or hematoma. Disc levels: Multilevel disc space narrowing and intervertebral disc calcifications. Multilevel posterior disc osteophytes, most notably as follows. At T6-T7, there is a right center posterior disc osteophyte complex with no more than mild spinal canal stenosis. At T7-T8, there is a posterior disc osteophyte complex resulting in mild-to-moderate spinal canal stenosis. At T8-T9, there is a posterior disc osteophyte complex contributing to apparent severe spinal canal stenosis. Facet arthrosis within the lower thoracic spine. No compressive bony neural foraminal narrowing. CT LUMBAR SPINE FINDINGS Segmentation: 5 lumbar vertebrae. The caudal most well-formed you will disc space is designated L5-S1. Alignment: Dextrocurvature of the upper lumbar spine. Levocurvature of the lower lumbar spine. No significant spondylolisthesis. Vertebrae: Vertebral body height is maintained. No evidence of acute fracture to the lumbar spine. Multilevel ventrolateral osteophytes. Paraspinal and other soft tissues: No paraspinal mass or hematoma. Please refer to the same-day CT abdomen/pelvis for description of abdominopelvic soft tissue findings. Disc levels: Multilevel disc space narrowing. Most notably, there is advanced disc space narrowing on the left at L1-L2 common the left at L2-L3, and L3-L4. L1-L2: Disc bulge. Facet arthrosis and ligamentum flavum hypertrophy. No more than mild spinal canal stenosis is appreciated. Moderate left neural foraminal narrowing. L2-L3: Disc bulge. Facet arthrosis/ligamentum flavum hypertrophy. Moderate central canal stenosis with left greater than right subarticular narrowing. Moderate left neural foraminal narrowing. L3-L4: Disc bulge. Facet arthrosis and ligamentum flavum  hypertrophy. Moderate central canal stenosis with bilateral subarticular narrowing. Mild left neural foraminal narrowing. L4-L5: Disc bulge. Facet arthrosis/ligamentum flavum hypertrophy. At least moderate central canal stenosis with bilateral subarticular narrowing. Bilateral neural foraminal narrowing (moderate/severe right, mild left). L5-S1: Disc bulge. Facet arthrosis (advanced on the right). Ligamentum flavum hypertrophy. Right greater than left subarticular narrowing. No more than mild central canal stenosis is appreciated. Moderate right neural foraminal narrowing. IMPRESSION: Thoracic spine: 1. T5 and T11 vertebral compression fractures (with up to 40% height loss), new from the prior chest CT of 07/04/2010 but otherwise age-indeterminate. A thoracic spine MRI may be obtained for further evaluation, as clinically warranted. No significant bony retropulsion. 2. Thoracic spondylosis, as outlined. Most notably at T8-T9, a posterior disc osteophyte complex contributes to apparent severe spinal canal stenosis. Consider a thoracic spine MRI to assess for spinal cord impingement. 3. Thoracic dextrocurvature. 4. Exaggerated thoracic kyphosis. 5. Foci of tree-in-bud nodularity within the right upper lobe suspicious for atypical infection. 6. Aortic Atherosclerosis (ICD10-I70.0). Lumbar spine: 1. No evidence of acute fracture to the lumbar spine. 2. Dextrocurvature of the upper lumbar spine. Levocurvature of the lower lumbar spine. 3. Lumbar spondylosis, as outlined. Most notably at L4-L5, there is multifactorial at least moderate central canal stenosis with bilateral subarticular narrowing. Multilevel foraminal stenosis, as detailed. 4. Please refer to the same-day CT abdomen/pelvis for description of abdominopelvic soft tissue findings. Electronically Signed   By: Kellie Simmering D.O.   On: 11/24/2021 09:52   CT Lumbar Spine Wo Contrast  Result Date: 11/24/2021 CLINICAL DATA:  Provided history: Back trauma, no  prior imaging. Additional history provided: Patient found down. EXAM: CT THORACIC AND LUMBAR  SPINE WITHOUT CONTRAST TECHNIQUE: Multidetector CT imaging of the thoracic and lumbar spine was performed without contrast. Multiplanar CT image reconstructions were also generated. RADIATION DOSE REDUCTION: This exam was performed according to the departmental dose-optimization program which includes automated exposure control, adjustment of the mA and/or kV according to patient size and/or use of iterative reconstruction technique. COMPARISON:  Chest CT 07/04/2010. FINDINGS: CT THORACIC SPINE FINDINGS Alignment: Dextrocurvature of the thoracic spine. Exaggerated thoracic kyphosis. No significant spondylolisthesis. Vertebrae: T5 superior endplate compression fracture, new from the prior chest CT of 07/04/2010, but otherwise age-indeterminate (up to 40% height loss). T11 vertebral compression fracture (with mild height loss), new from the prior chest CT of 07/04/2010, but otherwise age-indeterminate. Vertebral body height is otherwise maintained. No evidence of acute fracture to the thoracic spine elsewhere. Paraspinal and other soft tissues: Pleuroparenchymal scarring and calcification within the imaged lung apices, bilaterally. Foci of tree-in-bud nodularity within the right upper lobe, suspicious for atypical infection. Atherosclerotic plaque within the thoracic and abdominal aorta and proximal major branch vessels of the neck, as well as carotid arteries. No paraspinal mass or hematoma. Disc levels: Multilevel disc space narrowing and intervertebral disc calcifications. Multilevel posterior disc osteophytes, most notably as follows. At T6-T7, there is a right center posterior disc osteophyte complex with no more than mild spinal canal stenosis. At T7-T8, there is a posterior disc osteophyte complex resulting in mild-to-moderate spinal canal stenosis. At T8-T9, there is a posterior disc osteophyte complex contributing to  apparent severe spinal canal stenosis. Facet arthrosis within the lower thoracic spine. No compressive bony neural foraminal narrowing. CT LUMBAR SPINE FINDINGS Segmentation: 5 lumbar vertebrae. The caudal most well-formed you will disc space is designated L5-S1. Alignment: Dextrocurvature of the upper lumbar spine. Levocurvature of the lower lumbar spine. No significant spondylolisthesis. Vertebrae: Vertebral body height is maintained. No evidence of acute fracture to the lumbar spine. Multilevel ventrolateral osteophytes. Paraspinal and other soft tissues: No paraspinal mass or hematoma. Please refer to the same-day CT abdomen/pelvis for description of abdominopelvic soft tissue findings. Disc levels: Multilevel disc space narrowing. Most notably, there is advanced disc space narrowing on the left at L1-L2 common the left at L2-L3, and L3-L4. L1-L2: Disc bulge. Facet arthrosis and ligamentum flavum hypertrophy. No more than mild spinal canal stenosis is appreciated. Moderate left neural foraminal narrowing. L2-L3: Disc bulge. Facet arthrosis/ligamentum flavum hypertrophy. Moderate central canal stenosis with left greater than right subarticular narrowing. Moderate left neural foraminal narrowing. L3-L4: Disc bulge. Facet arthrosis and ligamentum flavum hypertrophy. Moderate central canal stenosis with bilateral subarticular narrowing. Mild left neural foraminal narrowing. L4-L5: Disc bulge. Facet arthrosis/ligamentum flavum hypertrophy. At least moderate central canal stenosis with bilateral subarticular narrowing. Bilateral neural foraminal narrowing (moderate/severe right, mild left). L5-S1: Disc bulge. Facet arthrosis (advanced on the right). Ligamentum flavum hypertrophy. Right greater than left subarticular narrowing. No more than mild central canal stenosis is appreciated. Moderate right neural foraminal narrowing. IMPRESSION: Thoracic spine: 1. T5 and T11 vertebral compression fractures (with up to 40%  height loss), new from the prior chest CT of 07/04/2010 but otherwise age-indeterminate. A thoracic spine MRI may be obtained for further evaluation, as clinically warranted. No significant bony retropulsion. 2. Thoracic spondylosis, as outlined. Most notably at T8-T9, a posterior disc osteophyte complex contributes to apparent severe spinal canal stenosis. Consider a thoracic spine MRI to assess for spinal cord impingement. 3. Thoracic dextrocurvature. 4. Exaggerated thoracic kyphosis. 5. Foci of tree-in-bud nodularity within the right upper lobe suspicious for atypical infection. 6.  Aortic Atherosclerosis (ICD10-I70.0). Lumbar spine: 1. No evidence of acute fracture to the lumbar spine. 2. Dextrocurvature of the upper lumbar spine. Levocurvature of the lower lumbar spine. 3. Lumbar spondylosis, as outlined. Most notably at L4-L5, there is multifactorial at least moderate central canal stenosis with bilateral subarticular narrowing. Multilevel foraminal stenosis, as detailed. 4. Please refer to the same-day CT abdomen/pelvis for description of abdominopelvic soft tissue findings. Electronically Signed   By: Kellie Simmering D.O.   On: 11/24/2021 09:52   CT ABDOMEN PELVIS WO CONTRAST  Result Date: 11/24/2021 CLINICAL DATA:  Mechanical fall in an elderly patient EXAM: CT ABDOMEN AND PELVIS WITHOUT CONTRAST TECHNIQUE: Multidetector CT imaging of the abdomen and pelvis was performed following the standard protocol without IV contrast. RADIATION DOSE REDUCTION: This exam was performed according to the departmental dose-optimization program which includes automated exposure control, adjustment of the mA and/or kV according to patient size and/or use of iterative reconstruction technique. COMPARISON:  None FINDINGS: Lower chest: Emphysematous changes at lung bases. Minimal bibasilar atelectasis versus scarring. No pleural effusion or pneumothorax. Hepatobiliary: Calcified gallstones within gallbladder. Liver unremarkable.  Pancreas: Fatty replacement of pancreas.  No focal abnormalities. Spleen: Unremarkable Adrenals/Urinary Tract: Adrenal glands normal appearance. Nonobstructing 11 mm calculus RIGHT kidney. Exophytic cyst upper pole LEFT kidney 7.2 x 5.9 cm; may consider follow-up ultrasound characterization if clinically indicated based on patient age and comorbidities. Kidneys otherwise unremarkable. No hydronephrosis. Bladder unremarkable. Stomach/Bowel: Small hiatal hernia. Stomach and bowel loops otherwise normal appearance for exam lacking IV and oral contrast. Vascular/Lymphatic: Extensive atherosclerotic calcifications aorta, coronary arteries, iliac arteries, femoral arteries. High-grade narrowing of mid abdominal aorta by calcified plaque. Significant aortic tortuosity without aneurysmal dilatation. No adenopathy. Reproductive: Atrophic uterus and ovaries Other: No free air or free fluid.  No hernia. Musculoskeletal: Marked osseous demineralization. Rotatory scoliosis thoracolumbar spine. IMPRESSION: Cholelithiasis. Nonobstructing 11 mm RIGHT renal calculus. Small hiatal hernia. No acute intra-abdominal or intrapelvic injuries or findings. Extensive atherosclerotic calcifications with high-grade narrowing of the mid abdominal aorta. Aortic Atherosclerosis (ICD10-I70.0) and Emphysema (ICD10-J43.9). Electronically Signed   By: Lavonia Dana M.D.   On: 11/24/2021 09:24   CT Cervical Spine Wo Contrast  Result Date: 11/23/2021 CLINICAL DATA:  Witnessed fall. EXAM: CT CERVICAL SPINE WITHOUT CONTRAST TECHNIQUE: Multidetector CT imaging of the cervical spine was performed without intravenous contrast. Multiplanar CT image reconstructions were also generated. RADIATION DOSE REDUCTION: This exam was performed according to the departmental dose-optimization program which includes automated exposure control, adjustment of the mA and/or kV according to patient size and/or use of iterative reconstruction technique. COMPARISON:  None  Available. FINDINGS: Alignment: There is approximately 4 mm retrolisthesis of the C5 vertebral body on C6. Skull base and vertebrae: No acute fracture. Chronic and degenerative changes are seen involving the body and tip of the dens. Soft tissues and spinal canal: No prevertebral fluid or swelling. No visible canal hematoma. Disc levels: Marked severity endplate sclerosis is seen at the levels of C5-C6 and C6-C7, with moderate severity endplate sclerosis seen at the level of C4-C5. There is marked severity narrowing of the anterior atlantoaxial articulation. Marked severity intervertebral disc space narrowing is seen at C5-C6 and C6-C7. Posterior intervertebral disc space narrowing is noted at C4-C5. Bilateral marked severity multilevel facet joint hypertrophy is noted. Upper chest: Mild to moderate severity biapical scarring and/or atelectasis is seen. Other: None. IMPRESSION: 1. No acute fracture of the cervical spine. 2. Marked severity multilevel degenerative changes, as described above. 3. 4 mm retrolisthesis of the  C5 vertebral body on C6. 4. Mild to moderate severity biapical scarring and/or atelectasis. Electronically Signed   By: Virgina Norfolk M.D.   On: 11/23/2021 21:43   CT HEAD WO CONTRAST  Result Date: 11/23/2021 CLINICAL DATA:  Unwitnessed fall. EXAM: CT HEAD WITHOUT CONTRAST TECHNIQUE: Contiguous axial images were obtained from the base of the skull through the vertex without intravenous contrast. RADIATION DOSE REDUCTION: This exam was performed according to the departmental dose-optimization program which includes automated exposure control, adjustment of the mA and/or kV according to patient size and/or use of iterative reconstruction technique. COMPARISON:  Sep 20, 2015 FINDINGS: Brain: There is mild cerebral atrophy with widening of the extra-axial spaces and ventricular dilatation. There are areas of decreased attenuation within the white matter tracts of the supratentorial brain,  consistent with microvascular disease changes. Vascular: No hyperdense vessel or unexpected calcification. Skull: Normal. Negative for fracture or focal lesion. Sinuses/Orbits: No acute finding. Other: None. IMPRESSION: 1. No acute intracranial abnormality. 2. Paralyzed cerebral atrophy with chronic white matter small vessel ischemic changes. Electronically Signed   By: Virgina Norfolk M.D.   On: 11/23/2021 21:36   DG Pelvis 1-2 Views  Result Date: 11/23/2021 CLINICAL DATA:  Recent fall with right hip pain, initial encounter EXAM: PELVIS - 1 VIEW COMPARISON:  None Available. FINDINGS: Pelvic ring is intact. No acute fracture or dislocation is seen. No soft tissue abnormality is noted. Degenerative changes of the lumbar spine are noted. IMPRESSION: No acute abnormality noted. Electronically Signed   By: Inez Catalina M.D.   On: 11/23/2021 21:00   DG Chest 1 View  Result Date: 11/23/2021 CLINICAL DATA:  Unwitnessed fall. EXAM: CHEST  1 VIEW COMPARISON:  Chest x-ray 05/09/2010 FINDINGS: The heart size and mediastinal contours are within normal limits. Calcified pleural plaques in the lung apices appear unchanged. The lungs are otherwise clear. Surgical clips overlie the left lower lung. The visualized skeletal structures are unremarkable. IMPRESSION: 1. No acute cardiopulmonary process. 2. Calcified pleural plaques. Electronically Signed   By: Ronney Asters M.D.   On: 11/23/2021 21:00     Time coordinating discharge: Over 30 minutes    Tabitha Dee, MD  [facility] 11/27/2021, 12:35 PM

## 2021-11-27 NOTE — TOC Transition Note (Addendum)
Transition of Care [facility]) - CM/SW Discharge Note   Patient Details  Name: Tabitha Harris MRN: [id number] Date of Birth: [birth year]  [facility] CM/SW Contact:  Leeroy Cha, RN Phone Number: 11/27/2021, 11:47 AM   Clinical Narrative:    Patient dcd to [facility] for rehab.  Wants to return to home after rehab.  Relative -grandson alerted to transfer.  Ptar called [phone] for transport after 1330.   Final next level of care: Skilled Nursing Facility Barriers to Discharge: Barriers Resolved   Patient Goals and CMS Choice Patient states their goals for this hospitalization and ongoing recovery are:: "To go home"   Choice offered to / list presented to : Patient  Discharge Placement                       Discharge Plan and Services In-house Referral: Clinical Social Work Discharge Planning Services: CM Consult Post Acute Care Choice: [place]          DME Arranged: N/A DME Agency: NA                  Social Determinants of Health (SDOH) Interventions     Readmission Risk Interventions     No data to display

## 2021-11-28 DIAGNOSIS — L89150 Pressure ulcer of sacral region, unstageable: Secondary | ICD-10-CM | POA: Diagnosis not present

## 2021-11-28 DIAGNOSIS — I5032 Chronic diastolic (congestive) heart failure: Secondary | ICD-10-CM | POA: Diagnosis not present

## 2021-11-28 DIAGNOSIS — I10 Essential (primary) hypertension: Secondary | ICD-10-CM | POA: Diagnosis not present

## 2021-11-28 DIAGNOSIS — C50919 Malignant neoplasm of unspecified site of unspecified female breast: Secondary | ICD-10-CM | POA: Diagnosis not present

## 2021-11-28 DIAGNOSIS — Z9181 History of falling: Secondary | ICD-10-CM | POA: Diagnosis not present

## 2021-11-28 DIAGNOSIS — E785 Hyperlipidemia, unspecified: Secondary | ICD-10-CM | POA: Diagnosis not present

## 2021-11-28 DIAGNOSIS — L97829 Non-pressure chronic ulcer of other part of left lower leg with unspecified severity: Secondary | ICD-10-CM | POA: Diagnosis not present

## 2021-11-28 DIAGNOSIS — K219 Gastro-esophageal reflux disease without esophagitis: Secondary | ICD-10-CM | POA: Diagnosis not present

## 2021-11-28 DIAGNOSIS — W1830XA Fall on same level, unspecified, initial encounter: Secondary | ICD-10-CM | POA: Diagnosis not present

## 2021-11-28 DIAGNOSIS — W19XXXA Unspecified fall, initial encounter: Secondary | ICD-10-CM | POA: Diagnosis not present

## 2021-11-28 DIAGNOSIS — Z741 Need for assistance with personal care: Secondary | ICD-10-CM | POA: Diagnosis not present

## 2021-11-28 DIAGNOSIS — Y92009 Unspecified place in unspecified non-institutional (private) residence as the place of occurrence of the external cause: Secondary | ICD-10-CM | POA: Diagnosis not present

## 2021-11-28 DIAGNOSIS — R296 Repeated falls: Secondary | ICD-10-CM | POA: Diagnosis not present

## 2021-11-28 DIAGNOSIS — R293 Abnormal posture: Secondary | ICD-10-CM | POA: Diagnosis not present

## 2021-11-28 DIAGNOSIS — R339 Retention of urine, unspecified: Secondary | ICD-10-CM | POA: Diagnosis not present

## 2021-11-28 DIAGNOSIS — M25559 Pain in unspecified hip: Secondary | ICD-10-CM | POA: Diagnosis not present

## 2021-11-28 DIAGNOSIS — Z23 Encounter for immunization: Secondary | ICD-10-CM | POA: Diagnosis not present

## 2021-11-28 DIAGNOSIS — L89153 Pressure ulcer of sacral region, stage 3: Secondary | ICD-10-CM | POA: Diagnosis not present

## 2021-11-28 DIAGNOSIS — I959 Hypotension, unspecified: Secondary | ICD-10-CM | POA: Diagnosis not present

## 2021-11-28 DIAGNOSIS — Z7401 Bed confinement status: Secondary | ICD-10-CM | POA: Diagnosis not present

## 2021-11-28 DIAGNOSIS — R338 Other retention of urine: Secondary | ICD-10-CM | POA: Diagnosis not present

## 2021-11-28 DIAGNOSIS — N390 Urinary tract infection, site not specified: Secondary | ICD-10-CM | POA: Diagnosis not present

## 2021-11-28 DIAGNOSIS — M6281 Muscle weakness (generalized): Secondary | ICD-10-CM | POA: Diagnosis not present

## 2021-11-28 DIAGNOSIS — R278 Other lack of coordination: Secondary | ICD-10-CM | POA: Diagnosis not present

## 2021-11-28 DIAGNOSIS — R2681 Unsteadiness on feet: Secondary | ICD-10-CM | POA: Diagnosis not present

## 2021-11-28 DIAGNOSIS — M6282 Rhabdomyolysis: Secondary | ICD-10-CM | POA: Diagnosis not present

## 2021-11-28 DIAGNOSIS — N179 Acute kidney failure, unspecified: Secondary | ICD-10-CM | POA: Diagnosis not present

## 2021-11-28 DIAGNOSIS — L98499 Non-pressure chronic ulcer of skin of other sites with unspecified severity: Secondary | ICD-10-CM | POA: Diagnosis not present

## 2021-11-28 DIAGNOSIS — R410 Disorientation, unspecified: Secondary | ICD-10-CM | POA: Diagnosis not present

## 2021-11-28 DIAGNOSIS — Z8673 Personal history of transient ischemic attack (TIA), and cerebral infarction without residual deficits: Secondary | ICD-10-CM | POA: Diagnosis not present

## 2021-11-28 DIAGNOSIS — W19XXXD Unspecified fall, subsequent encounter: Secondary | ICD-10-CM | POA: Diagnosis not present

## 2021-11-28 DIAGNOSIS — R41841 Cognitive communication deficit: Secondary | ICD-10-CM | POA: Diagnosis not present

## 2021-11-28 DIAGNOSIS — R262 Difficulty in walking, not elsewhere classified: Secondary | ICD-10-CM | POA: Diagnosis not present

## 2021-11-28 DIAGNOSIS — I350 Nonrheumatic aortic (valve) stenosis: Secondary | ICD-10-CM | POA: Diagnosis not present

## 2021-11-28 DIAGNOSIS — R1311 Dysphagia, oral phase: Secondary | ICD-10-CM | POA: Diagnosis not present

## 2021-11-28 DIAGNOSIS — E039 Hypothyroidism, unspecified: Secondary | ICD-10-CM | POA: Diagnosis not present

## 2021-11-28 LAB — BASIC METABOLIC PANEL
Anion gap: 4 — ABNORMAL LOW (ref 5–15)
BUN: 35 mg/dL — ABNORMAL HIGH (ref 8–23)
CO2: 23 mmol/L (ref 22–32)
Calcium: 7.6 mg/dL — ABNORMAL LOW (ref 8.9–10.3)
Chloride: 116 mmol/L — ABNORMAL HIGH (ref 98–111)
Creatinine, Ser: 1.09 mg/dL — ABNORMAL HIGH (ref 0.44–1.00)
GFR, Estimated: 46 mL/min — ABNORMAL LOW (ref >60)
Glucose, Bld: 105 mg/dL — ABNORMAL HIGH (ref 70–99)
Potassium: 3.7 mmol/L (ref 3.5–5.1)
Sodium: 143 mmol/L (ref 135–145)

## 2021-11-28 LAB — CBC WITH DIFFERENTIAL/PLATELET
Abs Immature Granulocytes: 0.08 10*3/uL — ABNORMAL HIGH (ref 0.00–0.07)
Basophils Absolute: 0 10*3/uL (ref 0.0–0.1)
Basophils Relative: 0 %
Eosinophils Absolute: 0.1 10*3/uL (ref 0.0–0.5)
Eosinophils Relative: 1 %
HCT: 25.9 % — ABNORMAL LOW (ref 36.0–46.0)
Hemoglobin: 8.4 g/dL — ABNORMAL LOW (ref 12.0–15.0)
Immature Granulocytes: 1 %
Lymphocytes Relative: 16 %
Lymphs Abs: 1.6 10*3/uL (ref 0.7–4.0)
MCH: 33.9 pg (ref 26.0–34.0)
MCHC: 32.4 g/dL (ref 30.0–36.0)
MCV: 104.4 fL — ABNORMAL HIGH (ref 80.0–100.0)
Monocytes Absolute: 0.8 10*3/uL (ref 0.1–1.0)
Monocytes Relative: 8 %
Neutro Abs: 7 10*3/uL (ref 1.7–7.7)
Neutrophils Relative %: 74 %
Platelets: 139 10*3/uL — ABNORMAL LOW (ref 150–400)
RBC: 2.48 MIL/uL — ABNORMAL LOW (ref 3.87–5.11)
RDW: 20.1 % — ABNORMAL HIGH (ref 11.5–15.5)
WBC: 9.7 10*3/uL (ref 4.0–10.5)
nRBC: 0 % (ref 0.0–0.2)

## 2021-11-28 LAB — MAGNESIUM: Magnesium: 1.4 mg/dL — ABNORMAL LOW (ref 1.7–2.4)

## 2021-11-28 NOTE — Discharge Summary (Signed)
Physician Discharge Summary   Tabitha Harris HYQ:[id number] DOB: 08-16-1919 DOA: 11/23/2021  PCP: Merrilee Seashore, MD  Admit date: 11/23/2021 Discharge date:  11/28/21 Barriers to discharge: none  Admitted From: Home Disposition:  [name] Discharging physician: Dwyane Dee, MD  Recommendations for Outpatient Follow-up:  Remove foley in 1-2 days and perform trial of void Repeat TSH in 4 to 6 weeks  Home Health:  Equipment/Devices:   Discharge Condition: stable CODE STATUS: DNR Diet recommendation:  Diet Orders (From admission, onward)     Start     Ordered   11/27/21 0000  Diet general        11/27/21 1200   11/24/21 1538  Diet regular Room service appropriate? Yes; Fluid consistency: Thin  Diet effective now       Question Answer Comment  Room service appropriate? Yes   Fluid consistency: Thin      11/24/21 1537            Hospital Course: Tabitha Harris is a 88 year old female with PMH TIA, HTN, HLD, history of breast cancer, hard of hearing, hypothyroidism, GERD who presented after being found down for an unknown amount of time. Neighbors had noticed that the patient had not picked up her newspaper in a few days and called for a well check. Patient did not remember how she fell or why nor how long she was on the ground. Multiple imaging studies were performed on admission. CXR negative for acute abnormalities.  X-ray pelvis also negative for acute fractures.  CT head negative for acute findings and shows underlying cerebral atrophy.  CT C-spine negative for acute fractures and shows underlying chronic degenerative changes. Renal function was worse beyond baseline compared to prior, creatinine 3.66 on admission.  CK was mildly elevated, 456.  Troponins were also indeterminately elevated and down trended after being started on fluids.  EKG was negative for ischemic changes and patient had no complaints of chest pain.  Assessment and Plan: * AKI (acute kidney  injury) (HCC)-resolved as of 11/27/2021 - FeNa 0.3% consistent with pre-renal due to volume depletion in setting of decreased intake from being found down - baseline creatinine ~ 0.8 - patient presents with increase in creat >0.3 mg/dL above baseline, creat increase >1.5x baseline presumed to have occurred within past 7 days PTA - creat 3.66 on admission -Creatinine improving with fluids -Creatinine 1.17 at time of discharge - Continue encouraging good oral intake  Acute urinary retention - Patient required straight cath after admission due to retention and continued to have ongoing retention with inability to void - strength improved some during hospitalization and renal function normalized - foley continued at discharge until patient more mobile and stronger; would try to remove foley in 1-2 days and perform trial of void  Fall at home, initial encounter - Unclear etiology and patient cannot remember falling or events leading up to it - Syncope work-up initiated on admission - Patient lives alone with no family in the area - s/p PT/OT evals; plan is for SNF at discharge   Hypothyroidism - TSH 7.583.  Suspect some unintentional missed doses given advanced age and forgetfulness - Free T4 lower limit of normal, 0.67. TT3 <20  - continue synthroid - needs repeat TSH in 4-6 weeks  Rhabdomyolysis-resolved as of 11/26/2021 - mild, due to being down for u/k amount of time - CK 456 on admission -CK normalized after fluids  Leukocytosis-resolved as of 11/26/2021 - Suspected multifactorial in setting of hemoconcentration and reactive.  Low suspicion  for infection at this time  Aortic stenosis - noted on echo for syncope workup; degree of AS unable to be measured - given advanced age and frailty, suspect she would not be a candidate for aggressive interventions - BP and HR stable - continue conservative measures for now   Pressure injury of skin - continue wound care rec's  Chronic  diastolic CHF (congestive heart failure) (HCC) - No signs of exacerbation.  Patient appears hypovolemic especially in setting of AKI -Echo notes EF 60 to 65% with grade 2 diastolic dysfunction  Hip pain - Negative for fracture on imaging work-up on admission - Continue supportive care  Macrocytic anemia - Folate and B12 normal  Essential hypertension - Remaining normotensive - Continue amlodipine  HLD (hyperlipidemia) - Likely no further mortality benefit for statin.  Okay to discontinue at this time  History of TIA (transient ischemic attack) - Continue aspirin  Elevated troponin-resolved as of 11/24/2021 - No ischemic changes on EKG and patient chest pain-free.  Some elevation attributed to renal dysfunction -Trops have downtrended, no further work-up or trending necessary    Principal Diagnosis: AKI (acute kidney injury) [facility])  Discharge Diagnoses: Active Hospital Problems   Diagnosis Date Noted   Acute urinary retention 11/24/2021    Priority: 2.   Fall at home, initial encounter 11/23/2021    Priority: 2.   Hypothyroidism 11/24/2021    Priority: 3.   Aortic stenosis 11/25/2021   Pressure injury of skin 11/24/2021   Hip pain 11/23/2021   Chronic diastolic CHF (congestive heart failure) ([facility]) 11/23/2021   Macrocytic anemia 08/31/2020   Essential hypertension    HLD (hyperlipidemia)    History of TIA (transient ischemic attack) 09/20/2015   hx: breast cancer, left UOQ, invasive lobular, receptor + her 2 - 05/25/2011    Resolved Hospital Problems   Diagnosis Date Noted Date Resolved   AKI (acute kidney injury) ([place]) 11/23/2021 11/27/2021    Priority: 1.   Rhabdomyolysis 11/24/2021 11/26/2021    Priority: 3.   Leukocytosis 11/23/2021 11/26/2021    Priority: 4.   Elevated troponin 11/23/2021 11/24/2021     Discharge Instructions     Diet general   Complete by: As directed    Discharge wound care:   Complete by: As directed    Apply foam dressings to the  sacrum and right trochanter.  Change every 3 days or as needed for soilage. Gently cleanse abrasions of the following areas with saline, pat dry, then apply Vaseline gauze or Xeroform gauze over the abrasions, topped with foam dressings: RLE, LLE (along the shins), LUE   Increase activity slowly   Complete by: As directed       Allergies as of 11/28/2021       Reactions   Codeine Other (See Comments)   Unknown per pt        Medication List     STOP taking these medications    atorvastatin 20 MG tablet Commonly known as: LIPITOR   calcium carbonate 600 MG Tabs tablet Commonly known as: OS-CAL   fish oil-omega-3 fatty acids 1000 MG capsule   phenol 1.4 % Liqd Commonly known as: CHLORASEPTIC       TAKE these medications    acetaminophen 500 MG tablet Commonly known as: TYLENOL Take 2 tablets (1,000 mg total) by mouth every 8 (eight) hours as needed for fever or headache (pain).   amLODipine 5 MG tablet Commonly known as: NORVASC Take 5 mg by mouth daily.   aspirin  81 MG tablet Take 81 mg by mouth daily.   bimatoprost 0.01 % Soln Commonly known as: LUMIGAN 1 drop at bedtime.   dorzolamide-timolol 22.3-6.8 MG/ML ophthalmic solution Commonly known as: COSOPT   esomeprazole 40 MG capsule Commonly known as: NEXIUM Take 40 mg by mouth daily as needed.   levothyroxine 50 MCG tablet Commonly known as: SYNTHROID Take 50 mcg by mouth daily. What changed: Another medication with the same name was removed. Continue taking this medication, and follow the directions you see here.   PRESERVISION AREDS 2+MULTI VIT PO Take by mouth.               Discharge Care Instructions  (From admission, onward)           Start     Ordered   11/27/21 0000  Discharge wound care:       Comments: Apply foam dressings to the sacrum and right trochanter.  Change every 3 days or as needed for soilage. Gently cleanse abrasions of the following areas with saline, pat dry,  then apply Vaseline gauze or Xeroform gauze over the abrasions, topped with foam dressings: RLE, LLE (along the shins), LUE   11/27/21 1200            Allergies  Allergen Reactions   Codeine Other (See Comments)    Unknown per pt    Consultations:   Procedures:   Discharge Exam: BP 111/69 (BP Location: Left Arm)   Pulse 83   Temp 98 F (36.7 C)   Resp 15   Ht '4\' 11"'$  (1.499 m)   Wt 37.7 kg   SpO2 97%   BMI 16.79 kg/m  Physical Exam Constitutional:      General: She is not in acute distress.    Appearance: She is not ill-appearing.  HENT:     Head: Normocephalic and atraumatic.     Comments: Small amount of dried blood noted along posterior right scalp    Mouth/Throat:     Mouth: Mucous membranes are moist.  Eyes:     Extraocular Movements: Extraocular movements intact.  Cardiovascular:     Rate and Rhythm: Normal rate and regular rhythm.  Pulmonary:     Effort: Pulmonary effort is normal.     Breath sounds: Normal breath sounds.  Abdominal:     General: Bowel sounds are normal. There is no distension.     Palpations: Abdomen is soft.     Tenderness: There is no abdominal tenderness.  Musculoskeletal:        General: Normal range of motion.     Cervical back: Normal range of motion and neck supple.  Skin:    General: Skin is warm and dry.  Neurological:     General: No focal deficit present.     Mental Status: She is alert.  Psychiatric:        Mood and Affect: Mood normal.        Behavior: Behavior normal.      The results of significant diagnostics from this hospitalization (including imaging, microbiology, ancillary and laboratory) are listed below for reference.   Microbiology: No results found for this or any previous visit (from the past 240 hour(s)).   Labs: BNP (last 3 results) No results for input(s): "BNP" in the last 8760 hours. Basic Metabolic Panel: Recent Labs  Lab 11/23/21 2042 11/24/21 0200 11/25/21 0323 11/26/21 0342  11/27/21 0243 11/28/21 0251  NA 143 143 140 143 141 143  K 4.4 3.9 2.9* 3.9  3.6 3.7  CL 105 104 106 115* 116* 116*  CO2 22 21* '26 24 22 23  '$ GLUCOSE 123* 107* 83 99 97 105*  BUN 91* 84* 67* 46* 35* 35*  CREATININE 3.66* 3.01* 2.05* 1.43* 1.17* 1.09*  CALCIUM 9.0 8.4* 7.8* 7.5* 7.3* 7.6*  MG 2.6* 2.3 2.2 1.8 1.5* 1.4*  PHOS 5.4* 4.6  --   --   --   --     Liver Function Tests: Recent Labs  Lab 11/23/21 2042 11/24/21 0200  AST 67* 57*  ALT 34 31  ALKPHOS 57 51  BILITOT 2.0* 1.9*  PROT 6.6 5.8*  ALBUMIN 3.7 3.3*    No results for input(s): "LIPASE", "AMYLASE" in the last 168 hours. No results for input(s): "AMMONIA" in the last 168 hours. CBC: Recent Labs  Lab 11/24/21 0200 11/25/21 0323 11/26/21 0342 11/27/21 0243 11/28/21 0251  WBC 17.3* 9.6 7.8 7.7 9.7  NEUTROABS  --  7.9* 5.8 5.4 7.0  HGB 10.6* 8.8* 8.6* 8.3* 8.4*  HCT 31.9* 26.0* 26.0* 25.6* 25.9*  MCV 102.6* 101.6* 102.4* 103.2* 104.4*  PLT 145* 129* 139* 147* 139*    Cardiac Enzymes: Recent Labs  Lab 11/23/21 2042 11/24/21 0036 11/25/21 0323  CKTOTAL 456* 424* 175    BNP: Invalid input(s): "POCBNP" CBG: Recent Labs  Lab 11/23/21 2039  GLUCAP 111*    D-Dimer No results for input(s): "DDIMER" in the last 72 hours. Hgb A1c No results for input(s): "HGBA1C" in the last 72 hours. Lipid Profile No results for input(s): "CHOL", "HDL", "LDLCALC", "TRIG", "CHOLHDL", "LDLDIRECT" in the last 72 hours. Thyroid function studies No results for input(s): "TSH", "T4TOTAL", "T3FREE", "THYROIDAB" in the last 72 hours.  Invalid input(s): "FREET3" Anemia work up No results for input(s): "VITAMINB12", "FOLATE", "FERRITIN", "TIBC", "IRON", "RETICCTPCT" in the last 72 hours. Urinalysis    Component Value Date/Time   COLORURINE AMBER (A) 11/24/2021 0547   APPEARANCEUR CLOUDY (A) 11/24/2021 0547   LABSPEC 1.017 11/24/2021 0547   PHURINE 5.0 11/24/2021 0547   GLUCOSEU 50 (A) 11/24/2021 0547   HGBUR  MODERATE (A) 11/24/2021 0547   BILIRUBINUR NEGATIVE 11/24/2021 0547   KETONESUR NEGATIVE 11/24/2021 0547   PROTEINUR 30 (A) 11/24/2021 0547   UROBILINOGEN 0.2 05/09/2010 1205   NITRITE NEGATIVE 11/24/2021 0547   LEUKOCYTESUR LARGE (A) 11/24/2021 0547   Sepsis Labs Recent Labs  Lab 11/25/21 0323 11/26/21 0342 11/27/21 0243 11/28/21 0251  WBC 9.6 7.8 7.7 9.7    Microbiology No results found for this or any previous visit (from the past 240 hour(s)).  Procedures/Studies: DG Humerus Right  Result Date: 11/25/2021 CLINICAL DATA:  Fall EXAM: RIGHT HUMERUS - 2+ VIEW COMPARISON:  None Available. FINDINGS: No acute bony abnormality. Specifically, no fracture, subluxation, or dislocation. Mild degenerative changes in the right AC joint. IMPRESSION: No acute bony abnormality. Electronically Signed   By: Rolm Baptise M.D.   On: 11/25/2021 17:11   DG Shoulder Right Port  Result Date: 11/25/2021 CLINICAL DATA:  Found down. EXAM: RIGHT SHOULDER - 1 VIEW COMPARISON:  None Available. FINDINGS: Bones are diffusely demineralized. No evidence for an acute fracture. No findings to suggest shoulder dislocation or separation. No worrisome lytic or sclerotic osseous abnormality. IMPRESSION: Negative. Electronically Signed   By: Misty Stanley M.D.   On: 11/25/2021 17:07   DG Forearm Right  Result Date: 11/25/2021 CLINICAL DATA:  Fall EXAM: RIGHT FOREARM - 2 VIEW COMPARISON:  None Available. FINDINGS: No acute bony abnormality. Specifically, no fracture, subluxation, or  dislocation. Diffuse vascular calcifications. IMPRESSION: No acute bony abnormality. Electronically Signed   By: Rolm Baptise M.D.   On: 11/25/2021 17:07   ECHOCARDIOGRAM COMPLETE  Result Date: 11/24/2021    ECHOCARDIOGRAM REPORT   Patient Name:   CHESSICA AUDIA Date of Exam: 11/24/2021 Medical Rec #:  [id number]       Height:       59.0 in Accession #:    [id number]      Weight:       83.1 lb Date of Birth:  Sep 07, 1919       BSA:          1.270  m Patient Age:    88 years        BP:           127/96 mmHg Patient Gender: F               HR:           83 bpm. Exam Location:  Inpatient Procedure: 2D Echo, Color Doppler and Cardiac Doppler Indications:    R55 Syncope  History:        Patient has prior history of Echocardiogram examinations, most                 recent 09/21/2015. CHF; Risk Factors:Hypertension and                 Dyslipidemia.  Sonographer:    Raquel Sarna Senior RDCS Referring Phys: [id number] ANASTASSIA DOUTOVA  Sonographer Comments: Technically difficult due to thin body habitus and patient condition. Exam performed as completely as possible with patient guarding chest and stomach with her arms. IMPRESSIONS  1. Left ventricular ejection fraction, by estimation, is 60 to 65%. The left ventricle has normal function. The left ventricle has no regional wall motion abnormalities. There is mild left ventricular hypertrophy. Left ventricular diastolic parameters are consistent with Grade II diastolic dysfunction (pseudonormalization).  2. Peak RV-RA gradient 26 mmHg. Right ventricular systolic function is normal. The right ventricular size is normal.  3. The mitral valve is degenerative. No evidence of mitral valve regurgitation. No evidence of mitral stenosis. Moderate mitral annular calcification.  4. The aortic valve is tricuspid. There is severe calcifcation of the aortic valve. Aortic valve regurgitation is not visualized. Unable to assess for degree of aortic stenosis. There is significant aortic stenosis visually, but due to limitations of this study, unable to measure a gradient across the valve. Would consider TEE or repeat echo when patient can be more cooperative though at 88 years old not sure how vital this would be. FINDINGS  Left Ventricle: Left ventricular ejection fraction, by estimation, is 60 to 65%. The left ventricle has normal function. The left ventricle has no regional wall motion abnormalities. The left ventricular internal cavity size  was normal in size. There is  mild left ventricular hypertrophy. Left ventricular diastolic parameters are consistent with Grade II diastolic dysfunction (pseudonormalization). Right Ventricle: Peak RV-RA gradient 26 mmHg. The right ventricular size is normal. No increase in right ventricular wall thickness. Right ventricular systolic function is normal. Left Atrium: Left atrial size was normal in size. Right Atrium: Right atrial size was normal in size. Pericardium: There is no evidence of pericardial effusion. Mitral Valve: The mitral valve is degenerative in appearance. There is mild calcification of the mitral valve leaflet(s). Moderate mitral annular calcification. No evidence of mitral valve regurgitation. No evidence of mitral valve stenosis. Tricuspid Valve: The tricuspid valve is normal in structure.  Tricuspid valve regurgitation is not demonstrated. Aortic Valve: The aortic valve is tricuspid. There is severe calcifcation of the aortic valve. Aortic valve regurgitation is not visualized. Unable to assess for degree of aortic stenosis. Pulmonic Valve: The pulmonic valve was not well visualized. Pulmonic valve regurgitation is trivial. Aorta: The aortic root is normal in size and structure. Venous: The inferior vena cava was not well visualized. IAS/Shunts: No atrial level shunt detected by color flow Doppler.  LEFT VENTRICLE PLAX 2D LVIDd:         2.60 cm   Diastology LVIDs:         1.80 cm   LV e' medial:    6.64 cm/s LV PW:         0.90 cm   LV E/e' medial:  11.2 LV IVS:        0.90 cm   LV e' lateral:   5.11 cm/s LVOT diam:     2.00 cm   LV E/e' lateral: 14.6 LV SV:         50 LV SV Index:   40 LVOT Area:     3.14 cm  RIGHT VENTRICLE RV S prime:     11.00 cm/s TAPSE (M-mode): 2.4 cm LEFT ATRIUM             Index        RIGHT ATRIUM           Index LA diam:        2.80 cm 2.20 cm/m   RA Area:     10.20 cm LA Vol (A2C):   15.2 ml 11.97 ml/m  RA Volume:   19.10 ml  15.04 ml/m LA Vol (A4C):   16.5 ml  12.99 ml/m LA Biplane Vol: 17.4 ml 13.70 ml/m  AORTIC VALVE LVOT Vmax:   84.85 cm/s LVOT Vmean:  58.100 cm/s LVOT VTI:    0.160 m  AORTA Ao Root diam: 2.60 cm Ao Asc diam:  2.30 cm MITRAL VALVE               TRICUSPID VALVE MV Area (PHT): 2.97 cm    TR Peak grad:   26.2 mmHg MV Decel Time: 255 msec    TR Vmax:        256.00 cm/s MV E velocity: 74.60 cm/s MV A velocity: 69.80 cm/s  SHUNTS MV E/A ratio:  1.07        Systemic VTI:  0.16 m                            Systemic Diam: 2.00 cm Dalton McleanMD Electronically signed by Franki Monte Signature Date/Time: 11/24/2021/4:20:19 PM    Final    CT Thoracic Spine Wo Contrast  Result Date: 11/24/2021 CLINICAL DATA:  Provided history: Back trauma, no prior imaging. Additional history provided: Patient found down. EXAM: CT THORACIC AND LUMBAR SPINE WITHOUT CONTRAST TECHNIQUE: Multidetector CT imaging of the thoracic and lumbar spine was performed without contrast. Multiplanar CT image reconstructions were also generated. RADIATION DOSE REDUCTION: This exam was performed according to the departmental dose-optimization program which includes automated exposure control, adjustment of the mA and/or kV according to patient size and/or use of iterative reconstruction technique. COMPARISON:  Chest CT 07/04/2010. FINDINGS: CT THORACIC SPINE FINDINGS Alignment: Dextrocurvature of the thoracic spine. Exaggerated thoracic kyphosis. No significant spondylolisthesis. Vertebrae: T5 superior endplate compression fracture, new from the prior chest CT of 07/04/2010, but otherwise age-indeterminate (up to 40%  height loss). T11 vertebral compression fracture (with mild height loss), new from the prior chest CT of 07/04/2010, but otherwise age-indeterminate. Vertebral body height is otherwise maintained. No evidence of acute fracture to the thoracic spine elsewhere. Paraspinal and other soft tissues: Pleuroparenchymal scarring and calcification within the imaged lung apices,  bilaterally. Foci of tree-in-bud nodularity within the right upper lobe, suspicious for atypical infection. Atherosclerotic plaque within the thoracic and abdominal aorta and proximal major branch vessels of the neck, as well as carotid arteries. No paraspinal mass or hematoma. Disc levels: Multilevel disc space narrowing and intervertebral disc calcifications. Multilevel posterior disc osteophytes, most notably as follows. At T6-T7, there is a right center posterior disc osteophyte complex with no more than mild spinal canal stenosis. At T7-T8, there is a posterior disc osteophyte complex resulting in mild-to-moderate spinal canal stenosis. At T8-T9, there is a posterior disc osteophyte complex contributing to apparent severe spinal canal stenosis. Facet arthrosis within the lower thoracic spine. No compressive bony neural foraminal narrowing. CT LUMBAR SPINE FINDINGS Segmentation: 5 lumbar vertebrae. The caudal most well-formed you will disc space is designated L5-S1. Alignment: Dextrocurvature of the upper lumbar spine. Levocurvature of the lower lumbar spine. No significant spondylolisthesis. Vertebrae: Vertebral body height is maintained. No evidence of acute fracture to the lumbar spine. Multilevel ventrolateral osteophytes. Paraspinal and other soft tissues: No paraspinal mass or hematoma. Please refer to the same-day CT abdomen/pelvis for description of abdominopelvic soft tissue findings. Disc levels: Multilevel disc space narrowing. Most notably, there is advanced disc space narrowing on the left at L1-L2 common the left at L2-L3, and L3-L4. L1-L2: Disc bulge. Facet arthrosis and ligamentum flavum hypertrophy. No more than mild spinal canal stenosis is appreciated. Moderate left neural foraminal narrowing. L2-L3: Disc bulge. Facet arthrosis/ligamentum flavum hypertrophy. Moderate central canal stenosis with left greater than right subarticular narrowing. Moderate left neural foraminal narrowing. L3-L4:  Disc bulge. Facet arthrosis and ligamentum flavum hypertrophy. Moderate central canal stenosis with bilateral subarticular narrowing. Mild left neural foraminal narrowing. L4-L5: Disc bulge. Facet arthrosis/ligamentum flavum hypertrophy. At least moderate central canal stenosis with bilateral subarticular narrowing. Bilateral neural foraminal narrowing (moderate/severe right, mild left). L5-S1: Disc bulge. Facet arthrosis (advanced on the right). Ligamentum flavum hypertrophy. Right greater than left subarticular narrowing. No more than mild central canal stenosis is appreciated. Moderate right neural foraminal narrowing. IMPRESSION: Thoracic spine: 1. T5 and T11 vertebral compression fractures (with up to 40% height loss), new from the prior chest CT of 07/04/2010 but otherwise age-indeterminate. A thoracic spine MRI may be obtained for further evaluation, as clinically warranted. No significant bony retropulsion. 2. Thoracic spondylosis, as outlined. Most notably at T8-T9, a posterior disc osteophyte complex contributes to apparent severe spinal canal stenosis. Consider a thoracic spine MRI to assess for spinal cord impingement. 3. Thoracic dextrocurvature. 4. Exaggerated thoracic kyphosis. 5. Foci of tree-in-bud nodularity within the right upper lobe suspicious for atypical infection. 6. Aortic Atherosclerosis (ICD10-I70.0). Lumbar spine: 1. No evidence of acute fracture to the lumbar spine. 2. Dextrocurvature of the upper lumbar spine. Levocurvature of the lower lumbar spine. 3. Lumbar spondylosis, as outlined. Most notably at L4-L5, there is multifactorial at least moderate central canal stenosis with bilateral subarticular narrowing. Multilevel foraminal stenosis, as detailed. 4. Please refer to the same-day CT abdomen/pelvis for description of abdominopelvic soft tissue findings. Electronically Signed   By: Kellie Simmering D.O.   On: 11/24/2021 09:52   CT Lumbar Spine Wo Contrast  Result Date:  11/24/2021 CLINICAL DATA:  Provided  history: Back trauma, no prior imaging. Additional history provided: Patient found down. EXAM: CT THORACIC AND LUMBAR SPINE WITHOUT CONTRAST TECHNIQUE: Multidetector CT imaging of the thoracic and lumbar spine was performed without contrast. Multiplanar CT image reconstructions were also generated. RADIATION DOSE REDUCTION: This exam was performed according to the departmental dose-optimization program which includes automated exposure control, adjustment of the mA and/or kV according to patient size and/or use of iterative reconstruction technique. COMPARISON:  Chest CT 07/04/2010. FINDINGS: CT THORACIC SPINE FINDINGS Alignment: Dextrocurvature of the thoracic spine. Exaggerated thoracic kyphosis. No significant spondylolisthesis. Vertebrae: T5 superior endplate compression fracture, new from the prior chest CT of 07/04/2010, but otherwise age-indeterminate (up to 40% height loss). T11 vertebral compression fracture (with mild height loss), new from the prior chest CT of 07/04/2010, but otherwise age-indeterminate. Vertebral body height is otherwise maintained. No evidence of acute fracture to the thoracic spine elsewhere. Paraspinal and other soft tissues: Pleuroparenchymal scarring and calcification within the imaged lung apices, bilaterally. Foci of tree-in-bud nodularity within the right upper lobe, suspicious for atypical infection. Atherosclerotic plaque within the thoracic and abdominal aorta and proximal major branch vessels of the neck, as well as carotid arteries. No paraspinal mass or hematoma. Disc levels: Multilevel disc space narrowing and intervertebral disc calcifications. Multilevel posterior disc osteophytes, most notably as follows. At T6-T7, there is a right center posterior disc osteophyte complex with no more than mild spinal canal stenosis. At T7-T8, there is a posterior disc osteophyte complex resulting in mild-to-moderate spinal canal stenosis. At T8-T9,  there is a posterior disc osteophyte complex contributing to apparent severe spinal canal stenosis. Facet arthrosis within the lower thoracic spine. No compressive bony neural foraminal narrowing. CT LUMBAR SPINE FINDINGS Segmentation: 5 lumbar vertebrae. The caudal most well-formed you will disc space is designated L5-S1. Alignment: Dextrocurvature of the upper lumbar spine. Levocurvature of the lower lumbar spine. No significant spondylolisthesis. Vertebrae: Vertebral body height is maintained. No evidence of acute fracture to the lumbar spine. Multilevel ventrolateral osteophytes. Paraspinal and other soft tissues: No paraspinal mass or hematoma. Please refer to the same-day CT abdomen/pelvis for description of abdominopelvic soft tissue findings. Disc levels: Multilevel disc space narrowing. Most notably, there is advanced disc space narrowing on the left at L1-L2 common the left at L2-L3, and L3-L4. L1-L2: Disc bulge. Facet arthrosis and ligamentum flavum hypertrophy. No more than mild spinal canal stenosis is appreciated. Moderate left neural foraminal narrowing. L2-L3: Disc bulge. Facet arthrosis/ligamentum flavum hypertrophy. Moderate central canal stenosis with left greater than right subarticular narrowing. Moderate left neural foraminal narrowing. L3-L4: Disc bulge. Facet arthrosis and ligamentum flavum hypertrophy. Moderate central canal stenosis with bilateral subarticular narrowing. Mild left neural foraminal narrowing. L4-L5: Disc bulge. Facet arthrosis/ligamentum flavum hypertrophy. At least moderate central canal stenosis with bilateral subarticular narrowing. Bilateral neural foraminal narrowing (moderate/severe right, mild left). L5-S1: Disc bulge. Facet arthrosis (advanced on the right). Ligamentum flavum hypertrophy. Right greater than left subarticular narrowing. No more than mild central canal stenosis is appreciated. Moderate right neural foraminal narrowing. IMPRESSION: Thoracic spine: 1. T5  and T11 vertebral compression fractures (with up to 40% height loss), new from the prior chest CT of 07/04/2010 but otherwise age-indeterminate. A thoracic spine MRI may be obtained for further evaluation, as clinically warranted. No significant bony retropulsion. 2. Thoracic spondylosis, as outlined. Most notably at T8-T9, a posterior disc osteophyte complex contributes to apparent severe spinal canal stenosis. Consider a thoracic spine MRI to assess for spinal cord impingement. 3. Thoracic dextrocurvature. 4. Exaggerated  thoracic kyphosis. 5. Foci of tree-in-bud nodularity within the right upper lobe suspicious for atypical infection. 6. Aortic Atherosclerosis (ICD10-I70.0). Lumbar spine: 1. No evidence of acute fracture to the lumbar spine. 2. Dextrocurvature of the upper lumbar spine. Levocurvature of the lower lumbar spine. 3. Lumbar spondylosis, as outlined. Most notably at L4-L5, there is multifactorial at least moderate central canal stenosis with bilateral subarticular narrowing. Multilevel foraminal stenosis, as detailed. 4. Please refer to the same-day CT abdomen/pelvis for description of abdominopelvic soft tissue findings. Electronically Signed   By: Kellie Simmering D.O.   On: 11/24/2021 09:52   CT ABDOMEN PELVIS WO CONTRAST  Result Date: 11/24/2021 CLINICAL DATA:  Mechanical fall in an elderly patient EXAM: CT ABDOMEN AND PELVIS WITHOUT CONTRAST TECHNIQUE: Multidetector CT imaging of the abdomen and pelvis was performed following the standard protocol without IV contrast. RADIATION DOSE REDUCTION: This exam was performed according to the departmental dose-optimization program which includes automated exposure control, adjustment of the mA and/or kV according to patient size and/or use of iterative reconstruction technique. COMPARISON:  None FINDINGS: Lower chest: Emphysematous changes at lung bases. Minimal bibasilar atelectasis versus scarring. No pleural effusion or pneumothorax. Hepatobiliary:  Calcified gallstones within gallbladder. Liver unremarkable. Pancreas: Fatty replacement of pancreas.  No focal abnormalities. Spleen: Unremarkable Adrenals/Urinary Tract: Adrenal glands normal appearance. Nonobstructing 11 mm calculus RIGHT kidney. Exophytic cyst upper pole LEFT kidney 7.2 x 5.9 cm; may consider follow-up ultrasound characterization if clinically indicated based on patient age and comorbidities. Kidneys otherwise unremarkable. No hydronephrosis. Bladder unremarkable. Stomach/Bowel: Small hiatal hernia. Stomach and bowel loops otherwise normal appearance for exam lacking IV and oral contrast. Vascular/Lymphatic: Extensive atherosclerotic calcifications aorta, coronary arteries, iliac arteries, femoral arteries. High-grade narrowing of mid abdominal aorta by calcified plaque. Significant aortic tortuosity without aneurysmal dilatation. No adenopathy. Reproductive: Atrophic uterus and ovaries Other: No free air or free fluid.  No hernia. Musculoskeletal: Marked osseous demineralization. Rotatory scoliosis thoracolumbar spine. IMPRESSION: Cholelithiasis. Nonobstructing 11 mm RIGHT renal calculus. Small hiatal hernia. No acute intra-abdominal or intrapelvic injuries or findings. Extensive atherosclerotic calcifications with high-grade narrowing of the mid abdominal aorta. Aortic Atherosclerosis (ICD10-I70.0) and Emphysema (ICD10-J43.9). Electronically Signed   By: Lavonia Dana M.D.   On: 11/24/2021 09:24   CT Cervical Spine Wo Contrast  Result Date: 11/23/2021 CLINICAL DATA:  Witnessed fall. EXAM: CT CERVICAL SPINE WITHOUT CONTRAST TECHNIQUE: Multidetector CT imaging of the cervical spine was performed without intravenous contrast. Multiplanar CT image reconstructions were also generated. RADIATION DOSE REDUCTION: This exam was performed according to the departmental dose-optimization program which includes automated exposure control, adjustment of the mA and/or kV according to patient size and/or use  of iterative reconstruction technique. COMPARISON:  None Available. FINDINGS: Alignment: There is approximately 4 mm retrolisthesis of the C5 vertebral body on C6. Skull base and vertebrae: No acute fracture. Chronic and degenerative changes are seen involving the body and tip of the dens. Soft tissues and spinal canal: No prevertebral fluid or swelling. No visible canal hematoma. Disc levels: Marked severity endplate sclerosis is seen at the levels of C5-C6 and C6-C7, with moderate severity endplate sclerosis seen at the level of C4-C5. There is marked severity narrowing of the anterior atlantoaxial articulation. Marked severity intervertebral disc space narrowing is seen at C5-C6 and C6-C7. Posterior intervertebral disc space narrowing is noted at C4-C5. Bilateral marked severity multilevel facet joint hypertrophy is noted. Upper chest: Mild to moderate severity biapical scarring and/or atelectasis is seen. Other: None. IMPRESSION: 1. No acute fracture of the  cervical spine. 2. Marked severity multilevel degenerative changes, as described above. 3. 4 mm retrolisthesis of the C5 vertebral body on C6. 4. Mild to moderate severity biapical scarring and/or atelectasis. Electronically Signed   By: Virgina Norfolk M.D.   On: 11/23/2021 21:43   CT HEAD WO CONTRAST  Result Date: 11/23/2021 CLINICAL DATA:  Unwitnessed fall. EXAM: CT HEAD WITHOUT CONTRAST TECHNIQUE: Contiguous axial images were obtained from the base of the skull through the vertex without intravenous contrast. RADIATION DOSE REDUCTION: This exam was performed according to the departmental dose-optimization program which includes automated exposure control, adjustment of the mA and/or kV according to patient size and/or use of iterative reconstruction technique. COMPARISON:  Sep 20, 2015 FINDINGS: Brain: There is mild cerebral atrophy with widening of the extra-axial spaces and ventricular dilatation. There are areas of decreased attenuation within the  white matter tracts of the supratentorial brain, consistent with microvascular disease changes. Vascular: No hyperdense vessel or unexpected calcification. Skull: Normal. Negative for fracture or focal lesion. Sinuses/Orbits: No acute finding. Other: None. IMPRESSION: 1. No acute intracranial abnormality. 2. Paralyzed cerebral atrophy with chronic white matter small vessel ischemic changes. Electronically Signed   By: Virgina Norfolk M.D.   On: 11/23/2021 21:36   DG Pelvis 1-2 Views  Result Date: 11/23/2021 CLINICAL DATA:  Recent fall with right hip pain, initial encounter EXAM: PELVIS - 1 VIEW COMPARISON:  None Available. FINDINGS: Pelvic ring is intact. No acute fracture or dislocation is seen. No soft tissue abnormality is noted. Degenerative changes of the lumbar spine are noted. IMPRESSION: No acute abnormality noted. Electronically Signed   By: Inez Catalina M.D.   On: 11/23/2021 21:00   DG Chest 1 View  Result Date: 11/23/2021 CLINICAL DATA:  Unwitnessed fall. EXAM: CHEST  1 VIEW COMPARISON:  Chest x-ray 05/09/2010 FINDINGS: The heart size and mediastinal contours are within normal limits. Calcified pleural plaques in the lung apices appear unchanged. The lungs are otherwise clear. Surgical clips overlie the left lower lung. The visualized skeletal structures are unremarkable. IMPRESSION: 1. No acute cardiopulmonary process. 2. Calcified pleural plaques. Electronically Signed   By: Ronney Asters M.D.   On: 11/23/2021 21:00     Time coordinating discharge: Over 30 minutes    Dwyane Dee, MD  [facility] 11/28/2021, 10:06 AM

## 2021-11-28 NOTE — Progress Notes (Signed)
PTAR called back.  They state, "We do not have any patient by that name on our list to transport."  [facility]. Called X 4 by PTAR.  No answer.  PTAR called me back.  Because this will be a new admission for Tabitha Harris will transport patient from [facility] to [place] after 0800 this morning.  CRN here made aware.  Will report off to oncoming RN this morning about new plan for transport.  RN may need to call report to receiving RN at [facility].

## 2021-11-28 NOTE — TOC Progression Note (Signed)
Transition of Care [facility]) - Progression Note    Patient Details  Name: Tabitha Harris MRN: [id number] Date of Birth: 1920-02-08  [facility] CM/SW Contact  Leeroy Cha, RN Phone Number: 11/28/2021, 9:04 AM  Clinical Narrative:    [id number]-patient still not on pick up list.  Information again given to ptar.  Will pick up and take to [facility] high priority.   Expected Discharge Plan: Skilled Nursing Facility Barriers to Discharge: Barriers Resolved  Expected Discharge Plan and Services Expected Discharge Plan: [place] In-house Referral: Clinical Social Work Discharge Planning Services: CM Consult Post Acute Care Choice: [place] arrangements for the past 2 months: Single Family Home Expected Discharge Date: 11/27/21               DME Arranged: N/A DME Agency: NA                   Social Determinants of Health (SDOH) Interventions    Readmission Risk Interventions     No data to display

## 2021-11-28 NOTE — Progress Notes (Addendum)
RN called receiving facility, [facility], at this time. [name] nurse was busy, RN left phone number for call back.   73- RN was able to provide report to receiving nurse at [facility]. No further questions.

## 2021-11-29 DIAGNOSIS — M6282 Rhabdomyolysis: Secondary | ICD-10-CM | POA: Diagnosis not present

## 2021-11-29 DIAGNOSIS — W19XXXA Unspecified fall, initial encounter: Secondary | ICD-10-CM | POA: Diagnosis not present

## 2021-11-29 DIAGNOSIS — I350 Nonrheumatic aortic (valve) stenosis: Secondary | ICD-10-CM | POA: Diagnosis not present

## 2021-11-29 DIAGNOSIS — N179 Acute kidney failure, unspecified: Secondary | ICD-10-CM | POA: Diagnosis not present

## 2021-11-29 DIAGNOSIS — E039 Hypothyroidism, unspecified: Secondary | ICD-10-CM | POA: Diagnosis not present

## 2021-11-30 ENCOUNTER — Other Ambulatory Visit: Payer: Self-pay | Admitting: *Deleted

## 2021-11-30 DIAGNOSIS — I10 Essential (primary) hypertension: Secondary | ICD-10-CM | POA: Diagnosis not present

## 2021-11-30 DIAGNOSIS — E785 Hyperlipidemia, unspecified: Secondary | ICD-10-CM | POA: Diagnosis not present

## 2021-11-30 DIAGNOSIS — R339 Retention of urine, unspecified: Secondary | ICD-10-CM | POA: Diagnosis not present

## 2021-11-30 DIAGNOSIS — I5032 Chronic diastolic (congestive) heart failure: Secondary | ICD-10-CM | POA: Diagnosis not present

## 2021-11-30 DIAGNOSIS — L89153 Pressure ulcer of sacral region, stage 3: Secondary | ICD-10-CM | POA: Diagnosis not present

## 2021-11-30 DIAGNOSIS — E039 Hypothyroidism, unspecified: Secondary | ICD-10-CM | POA: Diagnosis not present

## 2021-11-30 NOTE — Patient Outreach (Signed)
Per [facility] Tabitha Harris admitted to [facility] SN[facility] on 11/28/21. Screening for potential [facility] care coordination/care management needs as a benefit of Tabitha Harris plan and PCP.   Secure notification sent to [facility] SNF SW to make aware writer following for progression, transition plans, and barriers.  Facility site visit to [facility]. Went to speak with Tabitha Harris at bedside. However, she was speaking with therapy.   Will continue to follow.   Marthenia Rolling, MSN, RN,BSN [place] Acute Care Coordinator [phone] [facility]) [phone]  (Toll free office)

## 2021-12-07 ENCOUNTER — Other Ambulatory Visit: Payer: Self-pay | Admitting: *Deleted

## 2021-12-07 DIAGNOSIS — L89150 Pressure ulcer of sacral region, unstageable: Secondary | ICD-10-CM | POA: Diagnosis not present

## 2021-12-07 NOTE — Patient Outreach (Signed)
[place] Coordinator follow up.   Tabitha Harris resides in [facility]. Met with Aldona Bar, MDS Coordinator who reports transition plan is for LTC.   Marthenia Rolling, MSN, RN,BSN [place] Acute Care Coordinator [phone] [facility]) [phone]  (Toll free office)

## 2021-12-11 DIAGNOSIS — N390 Urinary tract infection, site not specified: Secondary | ICD-10-CM | POA: Diagnosis not present

## 2021-12-11 DIAGNOSIS — I5032 Chronic diastolic (congestive) heart failure: Secondary | ICD-10-CM | POA: Diagnosis not present

## 2021-12-11 DIAGNOSIS — K219 Gastro-esophageal reflux disease without esophagitis: Secondary | ICD-10-CM | POA: Diagnosis not present

## 2021-12-11 DIAGNOSIS — I10 Essential (primary) hypertension: Secondary | ICD-10-CM | POA: Diagnosis not present

## 2021-12-11 DIAGNOSIS — R339 Retention of urine, unspecified: Secondary | ICD-10-CM | POA: Diagnosis not present

## 2021-12-13 DIAGNOSIS — I10 Essential (primary) hypertension: Secondary | ICD-10-CM | POA: Diagnosis not present

## 2021-12-13 DIAGNOSIS — R339 Retention of urine, unspecified: Secondary | ICD-10-CM | POA: Diagnosis not present

## 2021-12-13 DIAGNOSIS — K219 Gastro-esophageal reflux disease without esophagitis: Secondary | ICD-10-CM | POA: Diagnosis not present

## 2021-12-13 DIAGNOSIS — I5032 Chronic diastolic (congestive) heart failure: Secondary | ICD-10-CM | POA: Diagnosis not present

## 2021-12-14 DIAGNOSIS — L97829 Non-pressure chronic ulcer of other part of left lower leg with unspecified severity: Secondary | ICD-10-CM | POA: Diagnosis not present

## 2021-12-15 DIAGNOSIS — I5032 Chronic diastolic (congestive) heart failure: Secondary | ICD-10-CM | POA: Diagnosis not present

## 2021-12-15 DIAGNOSIS — I10 Essential (primary) hypertension: Secondary | ICD-10-CM | POA: Diagnosis not present

## 2021-12-15 DIAGNOSIS — R339 Retention of urine, unspecified: Secondary | ICD-10-CM | POA: Diagnosis not present

## 2021-12-15 DIAGNOSIS — K219 Gastro-esophageal reflux disease without esophagitis: Secondary | ICD-10-CM | POA: Diagnosis not present

## 2021-12-19 DIAGNOSIS — R339 Retention of urine, unspecified: Secondary | ICD-10-CM | POA: Diagnosis not present

## 2021-12-19 DIAGNOSIS — I5032 Chronic diastolic (congestive) heart failure: Secondary | ICD-10-CM | POA: Diagnosis not present

## 2021-12-19 DIAGNOSIS — K219 Gastro-esophageal reflux disease without esophagitis: Secondary | ICD-10-CM | POA: Diagnosis not present

## 2021-12-19 DIAGNOSIS — I10 Essential (primary) hypertension: Secondary | ICD-10-CM | POA: Diagnosis not present

## 2021-12-21 DIAGNOSIS — L98499 Non-pressure chronic ulcer of skin of other sites with unspecified severity: Secondary | ICD-10-CM | POA: Diagnosis not present

## 2021-12-28 DIAGNOSIS — L97819 Non-pressure chronic ulcer of other part of right lower leg with unspecified severity: Secondary | ICD-10-CM | POA: Diagnosis not present

## 2021-12-29 DIAGNOSIS — K219 Gastro-esophageal reflux disease without esophagitis: Secondary | ICD-10-CM | POA: Diagnosis not present

## 2021-12-29 DIAGNOSIS — I5032 Chronic diastolic (congestive) heart failure: Secondary | ICD-10-CM | POA: Diagnosis not present

## 2021-12-29 DIAGNOSIS — R339 Retention of urine, unspecified: Secondary | ICD-10-CM | POA: Diagnosis not present

## 2021-12-29 DIAGNOSIS — I10 Essential (primary) hypertension: Secondary | ICD-10-CM | POA: Diagnosis not present

## 2022-01-04 DIAGNOSIS — L97829 Non-pressure chronic ulcer of other part of left lower leg with unspecified severity: Secondary | ICD-10-CM | POA: Diagnosis not present

## 2022-04-10 ENCOUNTER — Other Ambulatory Visit: Payer: Self-pay

## 2022-04-10 ENCOUNTER — Encounter (HOSPITAL_COMMUNITY): Payer: Self-pay

## 2022-04-10 ENCOUNTER — Emergency Department (HOSPITAL_COMMUNITY): Payer: Medicare Other

## 2022-04-10 ENCOUNTER — Emergency Department (HOSPITAL_COMMUNITY)
Admission: EM | Admit: 2022-04-10 | Discharge: 2022-04-10 | Disposition: A | Discharge status: Home / Self Care | Payer: Medicare Other | Attending: Emergency Medicine | Admitting: Emergency Medicine

## 2022-04-10 DIAGNOSIS — S0083XA Contusion of other part of head, initial encounter: Secondary | ICD-10-CM | POA: Diagnosis not present

## 2022-04-10 DIAGNOSIS — Z8673 Personal history of transient ischemic attack (TIA), and cerebral infarction without residual deficits: Secondary | ICD-10-CM | POA: Insufficient documentation

## 2022-04-10 DIAGNOSIS — Z79899 Other long term (current) drug therapy: Secondary | ICD-10-CM | POA: Insufficient documentation

## 2022-04-10 DIAGNOSIS — E039 Hypothyroidism, unspecified: Secondary | ICD-10-CM | POA: Diagnosis not present

## 2022-04-10 DIAGNOSIS — Y92129 Unspecified place in nursing home as the place of occurrence of the external cause: Secondary | ICD-10-CM | POA: Insufficient documentation

## 2022-04-10 DIAGNOSIS — I5032 Chronic diastolic (congestive) heart failure: Secondary | ICD-10-CM | POA: Insufficient documentation

## 2022-04-10 DIAGNOSIS — Z853 Personal history of malignant neoplasm of breast: Secondary | ICD-10-CM | POA: Diagnosis not present

## 2022-04-10 DIAGNOSIS — S0993XA Unspecified injury of face, initial encounter: Secondary | ICD-10-CM | POA: Diagnosis present

## 2022-04-10 DIAGNOSIS — Z7982 Long term (current) use of aspirin: Secondary | ICD-10-CM | POA: Diagnosis not present

## 2022-04-10 DIAGNOSIS — E876 Hypokalemia: Secondary | ICD-10-CM | POA: Diagnosis not present

## 2022-04-10 DIAGNOSIS — R040 Epistaxis: Secondary | ICD-10-CM | POA: Diagnosis not present

## 2022-04-10 DIAGNOSIS — I110 Hypertensive heart disease with heart failure: Secondary | ICD-10-CM | POA: Diagnosis not present

## 2022-04-10 DIAGNOSIS — W01198A Fall on same level from slipping, tripping and stumbling with subsequent striking against other object, initial encounter: Secondary | ICD-10-CM | POA: Diagnosis not present

## 2022-04-10 DIAGNOSIS — D72829 Elevated white blood cell count, unspecified: Secondary | ICD-10-CM | POA: Insufficient documentation

## 2022-04-10 LAB — BASIC METABOLIC PANEL WITH GFR
Anion gap: 8 (ref 5–15)
BUN: 23 mg/dL (ref 8–23)
CO2: 25 mmol/L (ref 22–32)
Calcium: 8.2 mg/dL — ABNORMAL LOW (ref 8.9–10.3)
Chloride: 108 mmol/L (ref 98–111)
Creatinine, Ser: 0.69 mg/dL (ref 0.44–1.00)
GFR, Estimated: >60 mL/min
Glucose, Bld: 80 mg/dL (ref 70–99)
Potassium: 3.3 mmol/L — ABNORMAL LOW (ref 3.5–5.1)
Sodium: 141 mmol/L (ref 135–145)

## 2022-04-10 LAB — CBC WITH DIFFERENTIAL/PLATELET
Abs Immature Granulocytes: 0.06 10*3/uL (ref 0.00–0.07)
Basophils Absolute: 0.1 10*3/uL (ref 0.0–0.1)
Basophils Relative: 1 %
Eosinophils Absolute: 0.2 10*3/uL (ref 0.0–0.5)
Eosinophils Relative: 2 %
HCT: 27.8 % — ABNORMAL LOW (ref 36.0–46.0)
Hemoglobin: 8.9 g/dL — ABNORMAL LOW (ref 12.0–15.0)
Immature Granulocytes: 1 %
Lymphocytes Relative: 17 %
Lymphs Abs: 1.7 10*3/uL (ref 0.7–4.0)
MCH: 30.8 pg (ref 26.0–34.0)
MCHC: 32 g/dL (ref 30.0–36.0)
MCV: 96.2 fL (ref 80.0–100.0)
Monocytes Absolute: 1.2 10*3/uL — ABNORMAL HIGH (ref 0.1–1.0)
Monocytes Relative: 12 %
Neutro Abs: 6.8 10*3/uL (ref 1.7–7.7)
Neutrophils Relative %: 67 %
Platelets: 375 10*3/uL (ref 150–400)
RBC: 2.89 MIL/uL — ABNORMAL LOW (ref 3.87–5.11)
RDW: 21.9 % — ABNORMAL HIGH (ref 11.5–15.5)
WBC: 9.9 10*3/uL (ref 4.0–10.5)
nRBC: 0.6 % — ABNORMAL HIGH (ref 0.0–0.2)

## 2022-04-10 LAB — URINALYSIS, ROUTINE W REFLEX MICROSCOPIC
Bacteria, UA: NONE SEEN
Bilirubin Urine: NEGATIVE
Glucose, UA: NEGATIVE mg/dL
Ketones, ur: NEGATIVE mg/dL
Nitrite: NEGATIVE
Protein, ur: 100 mg/dL — AB
Specific Gravity, Urine: 1.018 (ref 1.005–1.030)
Trans Epithel, UA: 1
pH: 5 (ref 5.0–8.0)

## 2022-04-10 MED ORDER — SODIUM CHLORIDE 0.9 % IV BOLUS
500.0000 mL | Freq: Once | INTRAVENOUS | Status: AC
  Administered 2022-04-10: 500 mL via INTRAVENOUS

## 2022-04-10 NOTE — ED Triage Notes (Signed)
PTAR reports pt coming from [place] home. Pt has dementia. Staff called out for three falls in the past three days. All unwitnessed. Pt does not ambulate at baseline, staff states she is at her baseline, DNR at bedside.

## 2022-04-10 NOTE — ED Provider Notes (Signed)
[facility] DEPT Provider Note  CSN: [id number] Arrival date & time: 04/10/22 1015  Chief Complaint(s) Fall  HPI Tabitha Harris is a 88 y.o. female with history of stroke, hypertension, hyperlipidemia presenting from skilled nursing facility with unwitnessed fall.  Patient does not remember falling.  She reports she is here for "x-rays".  She denies any pain anywhere in her body.  I discussed with nursing home staff who report that she has had 3 unwitnessed falls in the past few days.  Today, patient told staff that she had hit her head and noted to have a mild nosebleed.  They also report that she has had some increased confusion from baseline.  She was recently treated for a urinary infection and completed the antibiotics.  No vomiting, diarrhea, fevers, cough, congestion, sore throat, abdominal pain, chest pain, or any other symptoms.  History limited due to altered mental status/dementia   Past Medical History Past Medical History:  Diagnosis Date   Breast cancer ([place])    GERD (gastroesophageal reflux disease)    Hernia    History of breast cancer    left   Hyperlipidemia    Hypertension    Stroke [facility])    Thyroid disease    hypothyroidism   Patient Active Problem List   Diagnosis Date Noted   Aortic stenosis 11/25/2021   Pressure injury of skin 11/24/2021   Hypothyroidism 11/24/2021   Acute urinary retention 11/24/2021   Fall at home, initial encounter 11/23/2021   Hip pain 11/23/2021   Chronic diastolic CHF (congestive heart failure) ([place]) 11/23/2021   Macrocytic anemia 08/31/2020   Amaurosis fugax, right eye 10/04/2015   Postoperative anemia due to acute blood loss    Vocal cord paralysis, unilateral complete    Carotid stenosis    Preoperative cardiovascular examination    HLD (hyperlipidemia)    Essential hypertension    Esophageal reflux    History of TIA (transient ischemic attack) 09/20/2015   Stroke-like symptom 09/20/2015    hx: breast cancer, left UOQ, invasive lobular, receptor + her 2 - 05/25/2011   Home Medication(s) Prior to Admission medications   Medication Sig Start Date End Date Taking? Authorizing Provider  acetaminophen (TYLENOL) 500 MG tablet Take 2 tablets (1,000 mg total) by mouth every 8 (eight) hours as needed for fever or headache (pain). 09/25/15   Barton Dubois, MD  amLODipine (NORVASC) 5 MG tablet Take 5 mg by mouth daily.    [provider]  aspirin 81 MG tablet Take 81 mg by mouth daily.    [provider]  bimatoprost (LUMIGAN) 0.01 % SOLN 1 drop at bedtime.    [provider]  dorzolamide-timolol (COSOPT) 22.3-6.8 MG/ML ophthalmic solution  12/13/11   [provider]  esomeprazole (NEXIUM) 40 MG capsule Take 40 mg by mouth daily as needed.    [provider]  levothyroxine (SYNTHROID) 50 MCG tablet Take 50 mcg by mouth daily. 06/07/21   [provider]  Multiple Vitamins-Minerals (PRESERVISION AREDS 2+MULTI VIT PO) Take by mouth.    [provider]  Past Surgical History Past Surgical History:  Procedure Laterality Date   BREAST BIOPSY     BREAST LUMPECTOMY  2012   left   ENDARTERECTOMY Right 09/23/2015   Procedure: ENDARTERECTOMY CAROTID WITH DACRON PATCH ANGIOPLASTY;  Surgeon: Mal Misty, MD;  Location: [facility] OR;  Service: Vascular;  Laterality: Right;   Family History Family History  Problem Relation Age of Onset   Cancer Mother        breast   Breast cancer Mother     Social History Social History   Tobacco Use   Smoking status: Never   Smokeless tobacco: Never  Vaping Use   Vaping Use: Never used  Substance Use Topics   Alcohol use: Yes    Alcohol/week: 1.0 standard drink of alcohol    Types: 1 Glasses of wine per week    Comment: occasionally   Drug use: No    Allergies Codeine  Review of Systems Review of Systems  All other systems reviewed and are negative.   Physical Exam Vital Signs  I have reviewed the triage vital signs BP 121/71   Pulse 81   Temp (!) 97 F (36.1 C) (Axillary)   Resp 17   SpO2 97%  Physical Exam Vitals and nursing note reviewed.  Constitutional:      General: She is not in acute distress.    Appearance: She is well-developed.  HENT:     Head: Normocephalic and atraumatic.     Mouth/Throat:     Mouth: Mucous membranes are moist.  Eyes:     Pupils: Pupils are equal, round, and reactive to light.  Cardiovascular:     Rate and Rhythm: Normal rate and regular rhythm.     Heart sounds: No murmur heard. Pulmonary:     Effort: Pulmonary effort is normal. No respiratory distress.     Breath sounds: Normal breath sounds.  Abdominal:     General: Abdomen is flat.     Palpations: Abdomen is soft.     Tenderness: There is no abdominal tenderness.  Musculoskeletal:        General: No tenderness.     Right lower leg: No edema.     Left lower leg: No edema.     Comments: No midline C, T, L-spine tenderness.  No chest wall tenderness or crepitus.  Full painless range of motion at the bilateral upper extremities including the shoulders, elbows, wrists, hand and fingers, and in the bilateral lower extremities including the hips, knees, ankle, toes.  No focal bony tenderness, injury or deformity.  Skin:    General: Skin is warm and dry.  Neurological:     General: No focal deficit present.     Mental Status: She is alert.     Comments: Cranial nerves II through XII intact, follows commands, moves all extremities equally, oriented to person, somewhat to situation, year, not to place.  Psychiatric:        Mood and Affect: Mood normal.        Behavior: Behavior normal.     ED Results and Treatments Labs (all labs ordered are listed, but only abnormal results are displayed) Labs Reviewed  BASIC METABOLIC PANEL  - Abnormal; Notable for the following components:      Result Value   Potassium 3.3 (*)    Calcium 8.2 (*)    All other components within normal limits  CBC WITH DIFFERENTIAL/PLATELET - Abnormal; Notable for the following components:   RBC 2.89 (*)  Hemoglobin 8.9 (*)    HCT 27.8 (*)    RDW 21.9 (*)    nRBC 0.6 (*)    Monocytes Absolute 1.2 (*)    All other components within normal limits  URINALYSIS, ROUTINE W REFLEX MICROSCOPIC - Abnormal; Notable for the following components:   Hgb urine dipstick MODERATE (*)    Protein, ur 100 (*)    Leukocytes,Ua MODERATE (*)    All other components within normal limits  URINE CULTURE                                                                                                                          Radiology CT Head Wo Contrast  Result Date: 04/10/2022 CLINICAL DATA:  Fall EXAM: CT HEAD WITHOUT CONTRAST CT CERVICAL SPINE WITHOUT CONTRAST TECHNIQUE: Multidetector CT imaging of the head and cervical spine was performed following the standard protocol without intravenous contrast. Multiplanar CT image reconstructions of the cervical spine were also generated. RADIATION DOSE REDUCTION: This exam was performed according to the departmental dose-optimization program which includes automated exposure control, adjustment of the mA and/or kV according to patient size and/or use of iterative reconstruction technique. COMPARISON:  CT head and cervical spine 11/23/2021 FINDINGS: CT HEAD FINDINGS Brain: There is no acute intracranial hemorrhage, extra-axial fluid collection, or acute infarct. Parenchymal volume is within expected limits for age. The ventricles are stable in size. Patchy hypodensity in the supratentorial white matter likely reflecting sequela of chronic small-vessel ischemic change is stable. The small remote infarct in the ventral medial right thalamus is unchanged. There is no mass lesion.  There is no mass effect or midline shift. Vascular:  There is calcification of the bilateral carotid siphons. Skull: Normal. Negative for fracture or focal lesion. Sinuses/Orbits: The paranasal sinuses are clear. Bilateral lens implants are in place. The globes and orbits are otherwise unremarkable. Other: None. CT CERVICAL SPINE FINDINGS Alignment: Reversal of the normal curvature with grade 1 anterolisthesis of C3 on C4, retrolisthesis of C5 on C6, and anterolisthesis of C7 on T1 are all unchanged, likely degenerative in nature. There is no jumped or perched facet or other evidence of traumatic malalignment. Skull base and vertebrae: Skull base alignment is maintained. Vertebral body heights are preserved. There is no evidence of acute fracture. There is no suspicious osseous lesion. Soft tissues and spinal canal: No prevertebral fluid or swelling. No visible canal hematoma. Disc levels: Advanced degenerative change of the C1-C2 articulation with degenerative pannus about the dens is unchanged. Advanced disc space narrowing and degenerative endplate change N9-G9 is unchanged. Multilevel facet arthropathy is unchanged. Upper chest: There is biapical scarring. Other: None. IMPRESSION: 1. Stable noncontrast head CT with no acute intracranial pathology. 2. No acute fracture or traumatic malalignment of the cervical spine. Electronically Signed   By: Valetta Mole M.D.   On: 04/10/2022 12:20   CT Cervical Spine Wo Contrast  Result Date: 04/10/2022 CLINICAL DATA:  Fall EXAM: CT HEAD WITHOUT CONTRAST CT CERVICAL  SPINE WITHOUT CONTRAST TECHNIQUE: Multidetector CT imaging of the head and cervical spine was performed following the standard protocol without intravenous contrast. Multiplanar CT image reconstructions of the cervical spine were also generated. RADIATION DOSE REDUCTION: This exam was performed according to the departmental dose-optimization program which includes automated exposure control, adjustment of the mA and/or kV according to patient size and/or use of  iterative reconstruction technique. COMPARISON:  CT head and cervical spine 11/23/2021 FINDINGS: CT HEAD FINDINGS Brain: There is no acute intracranial hemorrhage, extra-axial fluid collection, or acute infarct. Parenchymal volume is within expected limits for age. The ventricles are stable in size. Patchy hypodensity in the supratentorial white matter likely reflecting sequela of chronic small-vessel ischemic change is stable. The small remote infarct in the ventral medial right thalamus is unchanged. There is no mass lesion.  There is no mass effect or midline shift. Vascular: There is calcification of the bilateral carotid siphons. Skull: Normal. Negative for fracture or focal lesion. Sinuses/Orbits: The paranasal sinuses are clear. Bilateral lens implants are in place. The globes and orbits are otherwise unremarkable. Other: None. CT CERVICAL SPINE FINDINGS Alignment: Reversal of the normal curvature with grade 1 anterolisthesis of C3 on C4, retrolisthesis of C5 on C6, and anterolisthesis of C7 on T1 are all unchanged, likely degenerative in nature. There is no jumped or perched facet or other evidence of traumatic malalignment. Skull base and vertebrae: Skull base alignment is maintained. Vertebral body heights are preserved. There is no evidence of acute fracture. There is no suspicious osseous lesion. Soft tissues and spinal canal: No prevertebral fluid or swelling. No visible canal hematoma. Disc levels: Advanced degenerative change of the C1-C2 articulation with degenerative pannus about the dens is unchanged. Advanced disc space narrowing and degenerative endplate change K2-H0 is unchanged. Multilevel facet arthropathy is unchanged. Upper chest: There is biapical scarring. Other: None. IMPRESSION: 1. Stable noncontrast head CT with no acute intracranial pathology. 2. No acute fracture or traumatic malalignment of the cervical spine. Electronically Signed   By: Valetta Mole M.D.   On: 04/10/2022 12:20     Pertinent labs & imaging results that were available during my care of the patient were reviewed by me and considered in my medical decision making (see MDM for details).  Medications Ordered in ED Medications  sodium chloride 0.9 % bolus 500 mL (0 mLs Intravenous Stopped 04/10/22 1514)                                                                                                                                     Procedures Procedures  (including critical care time)  Medical Decision Making / ED Course   MDM:  88 year old female presenting to the emergency department with unwitnessed fall.  Patient well-appearing, no obvious signs of trauma on physical exam.  Unclear what her baseline orientation is but does have some chronic confusion.  Will obtain CT head and neck given  reported unwitnessed fall with head injury.  Will obtain basic labs, change Foley catheter and obtain repeat urinalysis given reported confusion increased from baseline.  No obvious other findings on exam such as abnormal lung findings, cough, nominal tenderness to suggest alternative infectious source.  Mild confusion could also be from toxic or metabolic abnormality such as uremia, hyponatremia.  If labs and urinalysis reassuring and CT head and neck negative, likely discharge back to nursing facility.  Clinical Course as of 04/10/22 1546  Tue Apr 10, 2022  1452 Bacteria, UA: NONE SEEN [WS]  0272 Low concern for urinary infection.  Will be sent for culture. [WS]    Clinical Course User Index [WS] Cristie Hem, MD     Additional history obtained: -Additional history obtained from caregiver -External records from outside source obtained and reviewed including: Chart review including previous notes, labs, imaging, consultation notes including ED visit 11/23/21   Lab Tests: -I ordered, reviewed, and interpreted labs.   The pertinent results include:   Labs Reviewed  BASIC METABOLIC PANEL -  Abnormal; Notable for the following components:      Result Value   Potassium 3.3 (*)    Calcium 8.2 (*)    All other components within normal limits  CBC WITH DIFFERENTIAL/PLATELET - Abnormal; Notable for the following components:   RBC 2.89 (*)    Hemoglobin 8.9 (*)    HCT 27.8 (*)    RDW 21.9 (*)    nRBC 0.6 (*)    Monocytes Absolute 1.2 (*)    All other components within normal limits  URINALYSIS, ROUTINE W REFLEX MICROSCOPIC - Abnormal; Notable for the following components:   Hgb urine dipstick MODERATE (*)    Protein, ur 100 (*)    Leukocytes,Ua MODERATE (*)    All other components within normal limits  URINE CULTURE    Notable for mild hypokalemia, chronic anemia, leukocytes in urine without bacteria or nitrites to suggest UTI  EKG   EKG Interpretation  Date/Time:  Tuesday April 10 2022 10:32:08 EST Ventricular Rate:  80 PR Interval:  177 QRS Duration: 98 QT Interval:  361 QTC Calculation: 417 R Axis:   86 Text Interpretation: Normal sinus rhythm Borderline right axis deviation Artifact in lead(s) I II III aVR aVL aVF V1 V2 V3 V4 V5 V6 Confirmed by Garnette Gunner [id number]) on 04/10/2022 10:52:59 AM         Imaging Studies ordered: I ordered imaging studies including CT head  and neck On my interpretation imaging demonstrates no acute process I independently visualized and interpreted imaging. I agree with the radiologist interpretation   Medicines ordered and prescription drug management: Meds ordered this encounter  Medications   sodium chloride 0.9 % bolus 500 mL    -I have reviewed the patients home medicines and have made adjustments as needed     Reevaluation: After the interventions noted above, I reevaluated the patient and found that they have improved  Co morbidities that complicate the patient evaluation  Past Medical History:  Diagnosis Date   Breast cancer ([place])    GERD (gastroesophageal reflux disease)    Hernia    History of  breast cancer    left   Hyperlipidemia    Hypertension    Stroke [facility])    Thyroid disease    hypothyroidism      Dispostion: Disposition decision including need for hospitalization was considered, and patient discharged from emergency department.    Final Clinical Impression(s) / ED Diagnoses Final  diagnoses:  Contusion of face, initial encounter     This chart was dictated using voice recognition software.  Despite best efforts to proofread,  errors can occur which can change the documentation meaning.    Cristie Hem, MD 04/10/22 [phone]

## 2022-04-12 LAB — URINE CULTURE: Culture: NO GROWTH

## 2024-01-22 DEATH — deceased
